# Patient Record
Sex: Female | Born: 1985 | Race: Black or African American | Hispanic: No | Marital: Single | Smoking: Former smoker
Health system: Southern US, Community
[De-identification: ages and names within clinical notes are randomized; demographics above are authoritative.]

## PROBLEM LIST (undated history)

## (undated) ENCOUNTER — Inpatient Hospital Stay (HOSPITAL_COMMUNITY): Payer: Medicaid Other

## (undated) DIAGNOSIS — R001 Bradycardia, unspecified: Secondary | ICD-10-CM

## (undated) DIAGNOSIS — E079 Disorder of thyroid, unspecified: Secondary | ICD-10-CM

## (undated) DIAGNOSIS — F410 Panic disorder [episodic paroxysmal anxiety] without agoraphobia: Secondary | ICD-10-CM

## (undated) DIAGNOSIS — R011 Cardiac murmur, unspecified: Secondary | ICD-10-CM

## (undated) DIAGNOSIS — R519 Headache, unspecified: Secondary | ICD-10-CM

## (undated) DIAGNOSIS — R0601 Orthopnea: Secondary | ICD-10-CM

## (undated) DIAGNOSIS — F419 Anxiety disorder, unspecified: Secondary | ICD-10-CM

## (undated) DIAGNOSIS — E039 Hypothyroidism, unspecified: Secondary | ICD-10-CM

## (undated) DIAGNOSIS — R51 Headache: Secondary | ICD-10-CM

## (undated) DIAGNOSIS — R55 Syncope and collapse: Secondary | ICD-10-CM

## (undated) DIAGNOSIS — Z8719 Personal history of other diseases of the digestive system: Secondary | ICD-10-CM

## (undated) DIAGNOSIS — Z87440 Personal history of urinary (tract) infections: Secondary | ICD-10-CM

## (undated) DIAGNOSIS — L709 Acne, unspecified: Secondary | ICD-10-CM

## (undated) DIAGNOSIS — Z950 Presence of cardiac pacemaker: Secondary | ICD-10-CM

## (undated) HISTORY — DX: Morbid (severe) obesity due to excess calories: E66.01

## (undated) HISTORY — DX: Syncope and collapse: R55

## (undated) HISTORY — DX: Disorder of thyroid, unspecified: E07.9

## (undated) HISTORY — DX: Panic disorder (episodic paroxysmal anxiety): F41.0

## (undated) HISTORY — DX: Bradycardia, unspecified: R00.1

---
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
## 2005-05-01 ENCOUNTER — Emergency Department (HOSPITAL_COMMUNITY): Admission: EM | Admit: 2005-05-01 | Discharge: 2005-05-01 | Payer: Self-pay | Admitting: Emergency Medicine

## 2006-01-12 ENCOUNTER — Inpatient Hospital Stay (HOSPITAL_COMMUNITY): Admission: AD | Admit: 2006-01-12 | Discharge: 2006-01-12 | Payer: Self-pay | Admitting: Family Medicine

## 2006-02-15 ENCOUNTER — Inpatient Hospital Stay (HOSPITAL_COMMUNITY): Admission: AD | Admit: 2006-02-15 | Discharge: 2006-02-16 | Payer: Self-pay | Admitting: Gynecology

## 2006-06-22 ENCOUNTER — Inpatient Hospital Stay (HOSPITAL_COMMUNITY): Admission: AD | Admit: 2006-06-22 | Discharge: 2006-06-22 | Payer: Self-pay | Admitting: Obstetrics and Gynecology

## 2006-07-14 ENCOUNTER — Inpatient Hospital Stay (HOSPITAL_COMMUNITY): Admission: AD | Admit: 2006-07-14 | Discharge: 2006-07-14 | Payer: Self-pay | Admitting: Obstetrics & Gynecology

## 2006-07-17 ENCOUNTER — Inpatient Hospital Stay (HOSPITAL_COMMUNITY): Admission: AD | Admit: 2006-07-17 | Discharge: 2006-07-18 | Payer: Self-pay | Admitting: Obstetrics and Gynecology

## 2006-08-25 ENCOUNTER — Inpatient Hospital Stay (HOSPITAL_COMMUNITY): Admission: AD | Admit: 2006-08-25 | Discharge: 2006-08-28 | Payer: Self-pay | Admitting: Obstetrics & Gynecology

## 2008-03-09 ENCOUNTER — Emergency Department (HOSPITAL_COMMUNITY): Admission: EM | Admit: 2008-03-09 | Discharge: 2008-03-10 | Payer: Self-pay | Admitting: Emergency Medicine

## 2008-09-02 ENCOUNTER — Inpatient Hospital Stay (HOSPITAL_COMMUNITY): Admission: AD | Admit: 2008-09-02 | Discharge: 2008-09-03 | Payer: Self-pay | Admitting: Family Medicine

## 2008-09-21 ENCOUNTER — Emergency Department (HOSPITAL_COMMUNITY): Admission: EM | Admit: 2008-09-21 | Discharge: 2008-09-22 | Payer: Self-pay | Admitting: Emergency Medicine

## 2008-10-29 ENCOUNTER — Inpatient Hospital Stay (HOSPITAL_COMMUNITY): Admission: AD | Admit: 2008-10-29 | Discharge: 2008-10-29 | Payer: Self-pay | Admitting: Obstetrics & Gynecology

## 2009-03-03 ENCOUNTER — Inpatient Hospital Stay (HOSPITAL_COMMUNITY): Admission: AD | Admit: 2009-03-03 | Discharge: 2009-03-04 | Payer: Self-pay | Admitting: *Deleted

## 2009-03-24 ENCOUNTER — Inpatient Hospital Stay (HOSPITAL_COMMUNITY): Admission: AD | Admit: 2009-03-24 | Discharge: 2009-03-24 | Payer: Self-pay | Admitting: Obstetrics and Gynecology

## 2009-04-05 ENCOUNTER — Inpatient Hospital Stay (HOSPITAL_COMMUNITY): Admission: AD | Admit: 2009-04-05 | Discharge: 2009-04-07 | Payer: Self-pay | Admitting: Obstetrics & Gynecology

## 2010-12-31 LAB — CBC
HCT: 32.8 % — ABNORMAL LOW (ref 36.0–46.0)
Hemoglobin: 11.8 g/dL — ABNORMAL LOW (ref 12.0–15.0)
MCHC: 34.2 g/dL (ref 30.0–36.0)
MCV: 90 fL (ref 78.0–100.0)
RBC: 3.65 MIL/uL — ABNORMAL LOW (ref 3.87–5.11)
RBC: 3.84 MIL/uL — ABNORMAL LOW (ref 3.87–5.11)
WBC: 15.6 10*3/uL — ABNORMAL HIGH (ref 4.0–10.5)

## 2011-01-01 LAB — URINE CULTURE

## 2011-01-01 LAB — URINALYSIS, ROUTINE W REFLEX MICROSCOPIC
Protein, ur: NEGATIVE mg/dL
Urobilinogen, UA: 0.2 mg/dL (ref 0.0–1.0)

## 2011-01-01 LAB — FETAL FIBRONECTIN: Fetal Fibronectin: NEGATIVE

## 2011-01-01 LAB — URINE MICROSCOPIC-ADD ON

## 2011-01-09 LAB — URINALYSIS, ROUTINE W REFLEX MICROSCOPIC
Hgb urine dipstick: NEGATIVE
Nitrite: NEGATIVE
Specific Gravity, Urine: >1.03 — ABNORMAL HIGH (ref 1.005–1.030)
Urobilinogen, UA: 0.2 mg/dL (ref 0.0–1.0)

## 2011-01-09 LAB — COMPREHENSIVE METABOLIC PANEL
Albumin: 3.4 g/dL — ABNORMAL LOW (ref 3.5–5.2)
BUN: 6 mg/dL (ref 6–23)
Chloride: 106 mEq/L (ref 96–112)
Creatinine, Ser: 0.56 mg/dL (ref 0.4–1.2)
Total Bilirubin: 0.5 mg/dL (ref 0.3–1.2)

## 2011-01-09 LAB — CBC
HCT: 41.2 % (ref 36.0–46.0)
MCV: 95.3 fL (ref 78.0–100.0)
Platelets: 276 10*3/uL (ref 150–400)
RDW: 12.1 % (ref 11.5–15.5)
WBC: 16.3 10*3/uL — ABNORMAL HIGH (ref 4.0–10.5)

## 2011-01-09 LAB — URINE MICROSCOPIC-ADD ON

## 2011-02-09 NOTE — Cardiovascular Report (Signed)
NAME:  Jean Rowe, Jean Rowe NO.:  [id number]   MEDICAL RECORD NO.:  [id number]          PATIENT TYPE:  OIB   LOCATION:  2899                         FACILITY:  [facility]   PHYSICIAN:  Janeece Riggers. Severiano Gilbert, M.D.    DATE OF BIRTH:  07/03/1986   DATE OF PROCEDURE:  10/12/2004  DATE OF DISCHARGE:                              CARDIAC CATHETERIZATION   PROCEDURES PERFORMED:  1.  Dual chamber pacemaker implantation.  2.  Fluoroscopy for implantation.  3.  Left axillary venogram.   SURGEON:  Mark E. Severiano Gilbert, M.D.   COMPLICATIONS:  None.   ESTIMATED BLOOD LOSS:  Less than 30 mL.   PREPROCEDURE DIAGNOSIS:  Syncope secondary to severe cardioinhibitory reflex  with bradycardia.   POSTPROCEDURE DIAGNOSIS:  Syncope secondary to severe cardioinhibitory  reflex with bradycardia.   DESCRIPTION OF PROCEDURE:  The patient was brought to the EP laboratory in a  fasted state.  The patient was prepped and draped in the usual manner over  the left pectoral area.  Vancomycin 1 g IV immediately prior to skin  incision for antimicrobial prophylaxis.  Lidocaine 1% subcutaneously for  local anesthesia.  Conscious sedation obtained with intermittent injection  of midazolam, Fentanyl and Phenergan.  A left axillary venogram was  performed.  A 3.5 cm incision 2 cm inferior to the left clavicle was made to  the level of the prepectoral fascia using a combination of sharp and blunt  dissection.  Inferior to the incision line along the prepectoral fascia an  appropriately sized pacemaker pocket was fashioned.  Hemostasis was insured  by visual inspection, electrocautery.  The pocket was copiously irrigated  with antibiotic-containing solution.  Using a thin wall needle technique,  the left axillary vein was entered on the first rib in an extrathoracic  technique twice.  Two 7 French sheaths were placed.  Via the 7 French  sheaths, a Guidant model number 4469 active fixation atrial lead was  advanced under  fluoroscopic guidance initially into the IVC and then  positioned  in the right atrial appendage.  After determining adequate  electronic characteristics, the safe sheath was removed and the lead was  sutured to the prepectoral fascia.  Next, a Guidant model number 4456  passive fixation ventricular lead was advanced fluoroscopically first into  the IVC, then using a curved stylet into the pulmonary artery and then again  with a straight stylet positioned in the right ventricular apex.  After  determining adequate electronic characteristics, the lead was sutured in  place with 0 silk ligatures after removal of the safe sheath system.  The  atrial and ventricular leads were then connected to a Guidant model number  1291, serial number [id number] dual chamber pacemaker which was then implanted  in the preformed pacemaker pocket.  The pocket was then closed in layers of  2-0 and 4-0 Vicryl.  The skin incision was closely approximated using Steri-  Strips and a large bulky pressure dressing applied.  The patient tolerated  the procedure well and was returned to the holding area in stable  hemodynamic condition.   The electronic characteristics of the  atrial lead demonstrated through the  analyzer P-waves of approximately 3 mV, a threshold of 0.2 volts at 0.5  msec, an impedance of 585 ohms.  The ventricular lead demonstrated an R-wave  of 20 to 23 mV, capture threshold of 0.3 volts at 0.5 msec, an impedance of  575 ohms.  There was absence of diaphragmatic pacing with both the atrial  and ventricular leads.  The device was programmed into a dual chamber pacing  mode, lower rate 60, upper rate 150 with a prolonged AV delay of 240 msec.  Both the atrial and ventricular outputs were 0.5 msec, 5 volts.  Given the  patient's cardioinhibitor resyncope, sudden brady response was programmed on  for this device.  The settings were five minute average detect time, five  beat evaluation at lower rate, 40  beat offset, with a five minute therapy  duration.      Mark   MEP/MEDQ  D:  10/12/2004  T:  10/12/2004  Job:  [id number]

## 2011-02-09 NOTE — Cardiovascular Report (Signed)
NAME:  Jean Rowe, Jean Rowe NO.:  [id number]   MEDICAL RECORD NO.:  [id number]          PATIENT TYPE:  AMB   LOCATION:  ENDO                         FACILITY:  [facility]   PHYSICIAN:  Darlin Priestly, M.D.DATE OF BIRTH:  1986/05/15   DATE OF PROCEDURE:  06/20/2004  DATE OF DISCHARGE:                              CARDIAC CATHETERIZATION   TILT TABLE TEST:  [name] is an 25 year old female patient of Dr. Daphene Jaeger and Dr. Jason Fila  with a history of recurrent presyncope and palpitations.  She has had a  negative 2D echocardiogram revealing normal LV size and function with normal  labs.  She is now referred for a tilt table test to rule out  neurocardiogenic syncope.   DESCRIPTION OF PROCEDURE:  After getting informed consent, the patient was  brought to the cardiac catheterization lab in a fasting state.  The patient  was then placed in a supine position and hemodynamic measures were obtained.  Resting blood pressure 112/69, resting heart rate 57.  The patient was then  monitored for approximately 5 minutes with no change in her blood pressure  our heart rate.  The patient was then tilted to a 70-degree head's up  position which she maintained for approximately 24 minutes at which point  she became somewhat agitated, restless, and then had a rather abrupt drop of  her blood pressure to 50/27 and marked bradycardia with several 8-10 second  pauses.  She was immediately returned to a supine position with prompt  return of her blood pressure and heart rate.  The patient was monitored for  approximately 10-15 minutes following this with return of her blood pressure  and heart rate to baseline.  She was transferred to recovery room in stable  condition.   CONCLUSION:  Positive head's up tilt table testing with marked hypotension,  bradycardia and several significant pauses of anywhere from 5-10 seconds.       RHM/MEDQ  D:  06/20/2004  T:  06/20/2004  Job:  [id number]

## 2011-02-09 NOTE — Discharge Summary (Signed)
NAME:  Jean Rowe, Jean Rowe NO.:  [id number]   MEDICAL RECORD NO.:  [id number]          PATIENT TYPE:  INP   LOCATION:  9131                          FACILITY:  [facility]   PHYSICIAN:  Malva Limes, M.D.    DATE OF BIRTH:  [birth year]   DATE OF ADMISSION:  08/25/2006  DATE OF DISCHARGE:  08/28/2006                               DISCHARGE SUMMARY   FINAL DIAGNOSIS:  1. Intrauterine pregnancy at 38+ weeks' gestation.  2. Oligohydramnios.  3. Positive group B strep.  4. Induction of labor.  5. Severe variables in the second stage of labor.  6. Moderate shoulder dystocia.   PROCEDURE:  Vacuum-assisted vaginal delivery of a female infant with  Apgars of 3, 7, and 8.  Main delivery performed by Dr. Ilda Mori.   COMPLICATIONS:  None.   This 25 year old G3, P0,0-2-0 presents at 38+ weeks' gestation for an  induction secondary to oligohydramnios.  The patient also has a known  positive group B strep culture that was obtained in the office.  Otherwise, her antepartum course has been uncomplicated.  She does have  a pacemaker in place secondary to syncope.  Otherwise, the patient was  admitted at this time.  AROM was performed and IV Pitocin was begun.  Ancef was also started for her group B strep positive culture.  No  sensitivities had been done. The patient dilated to complete.  She  developed some severe variable decelerations with pushing. At this  point, a Kiwi vacuum extractor was applied.  There was moderate shoulder  dystocia.  McRobert suprapubic pressure and Woods-screw maneuver were  used to deliver the posterior arm and shoulder.  Delivery went without  complications.  There was a nuchal cord times one.  She went on to  deliver a 6 pound 15 ounce female infant with Apgars of 3, 7, and 8.  There was a first-degree perineal laceration that was repaired.  Pediatrics was called to help with delivery.  The patient's postpartum  course was benign without any significant  fevers.  She was felt ready  for discharge on postpartum day #2.  She was sent home on a regular  diet, told to decrease activities, was told to use Percocet 1 to 2 every  4 hours as needed for pain.  She could use over-the-counter ibuprofen up  to 600 mg every 6 hours as needed for pain.  She did receive Depo-  Provera for birth control prior to discharge.  She is to follow up at  our office in 4 weeks.   LABS:  On discharge, the patient had a hemoglobin of 11.9, white blood  cell count of 18.5, and platelets of 160,000.      Leilani Able, P.A.-C.    ______________________________  Malva Limes, M.D.   MB/MEDQ  D:  09/25/2006  T:  09/25/2006  Job:  [id number]

## 2011-06-21 LAB — CBC
MCHC: 34.6
MCV: 92.6
Platelets: 234
RDW: 12.7
WBC: 8.8

## 2011-06-21 LAB — POCT CARDIAC MARKERS
CKMB, poc: <1 — ABNORMAL LOW
CKMB, poc: <1 — ABNORMAL LOW
Myoglobin, poc: 26.5
Myoglobin, poc: 27.3
Troponin i, poc: <0.05

## 2011-06-21 LAB — COMPREHENSIVE METABOLIC PANEL
AST: 19
Albumin: 3.5
BUN: 11
Calcium: 8.3 — ABNORMAL LOW
Creatinine, Ser: 0.66
GFR calc Af Amer: >60
Total Protein: 5.9 — ABNORMAL LOW

## 2011-06-21 LAB — DIFFERENTIAL
Eosinophils Relative: 2
Lymphocytes Relative: 37
Lymphs Abs: 3.2
Monocytes Absolute: 0.4
Neutro Abs: 5

## 2011-06-21 LAB — D-DIMER, QUANTITATIVE: D-Dimer, Quant: <0.22

## 2011-06-29 LAB — URINALYSIS, ROUTINE W REFLEX MICROSCOPIC
Bilirubin Urine: NEGATIVE
Nitrite: NEGATIVE
Specific Gravity, Urine: >1.03 — ABNORMAL HIGH (ref 1.005–1.030)
Urobilinogen, UA: 0.2 mg/dL (ref 0.0–1.0)

## 2011-06-29 LAB — CBC
MCHC: 34.7 g/dL (ref 30.0–36.0)
RBC: 4.04 MIL/uL (ref 3.87–5.11)

## 2011-06-29 LAB — POCT PREGNANCY, URINE: Preg Test, Ur: POSITIVE

## 2011-06-29 LAB — POCT I-STAT, CHEM 8
Creatinine, Ser: 0.6 mg/dL (ref 0.4–1.2)
HCT: 39 % (ref 36.0–46.0)
Hemoglobin: 13.3 g/dL (ref 12.0–15.0)
Potassium: 4 mEq/L (ref 3.5–5.1)
Sodium: 138 mEq/L (ref 135–145)

## 2011-06-29 LAB — DIFFERENTIAL
Basophils Absolute: 0 10*3/uL (ref 0.0–0.1)
Basophils Relative: 0 % (ref 0–1)
Monocytes Relative: 5 % (ref 3–12)
Neutro Abs: 10.2 10*3/uL — ABNORMAL HIGH (ref 1.7–7.7)
Neutrophils Relative %: 78 % — ABNORMAL HIGH (ref 43–77)

## 2011-06-29 LAB — D-DIMER, QUANTITATIVE: D-Dimer, Quant: 0.28 ug/mL-FEU (ref 0.00–0.48)

## 2011-09-25 HISTORY — PX: DILATION AND CURETTAGE OF UTERUS: SHX78

## 2012-03-06 ENCOUNTER — Encounter: Payer: Self-pay | Admitting: Internal Medicine

## 2013-06-23 ENCOUNTER — Ambulatory Visit (INDEPENDENT_AMBULATORY_CARE_PROVIDER_SITE_OTHER): Payer: Medicaid Other | Admitting: Cardiovascular Disease

## 2013-06-23 ENCOUNTER — Other Ambulatory Visit: Payer: Self-pay | Admitting: Cardiovascular Disease

## 2013-06-23 ENCOUNTER — Encounter: Payer: Self-pay | Admitting: Cardiovascular Disease

## 2013-06-23 VITALS — BP 112/60 | HR 89 | Resp 16 | Ht 61.0 in | Wt 150.6 lb

## 2013-06-23 DIAGNOSIS — R55 Syncope and collapse: Secondary | ICD-10-CM | POA: Insufficient documentation

## 2013-06-23 DIAGNOSIS — R079 Chest pain, unspecified: Secondary | ICD-10-CM

## 2013-06-23 DIAGNOSIS — R5381 Other malaise: Secondary | ICD-10-CM

## 2013-06-23 DIAGNOSIS — E049 Nontoxic goiter, unspecified: Secondary | ICD-10-CM | POA: Insufficient documentation

## 2013-06-23 DIAGNOSIS — Z950 Presence of cardiac pacemaker: Secondary | ICD-10-CM | POA: Insufficient documentation

## 2013-06-23 LAB — T4, FREE: Free T4: 1.19 ng/dL (ref 0.80–1.80)

## 2013-06-23 LAB — TSH: TSH: 0.184 u[IU]/mL — ABNORMAL LOW (ref 0.350–4.500)

## 2013-06-23 NOTE — Assessment & Plan Note (Signed)
No events since pacemaker implantation, after she had failed medical therapy with numerous meds (fludrocortisone, serotonin inhibitors, etc.). Sudden bradycardia response occurs frequently on her pacemaker.

## 2013-06-23 NOTE — Progress Notes (Signed)
Patient ID: Jean Rowe, female   DOB: March 13, 1986, 27 y.o.   MRN: [id number]     Reason for office visit Pacemaker followup, neurocardiogenic syncope  This is [name] first office visit and first pacemaker check since July of 2011. She was previously followed by Dr. Lynnea Rowe who is no longer part of our practice. She has a long-standing history of neurocardiogenic syncope and underwent implantation of a dual-chamber permanent pacemaker which was only 27 years old. Prior to that multiple attempts at treatment of her syncope with a variety of pharmacological agents were unsuccessful. Now she is no longer taking any medications for syncope and has had excellent response to pacemaker therapy. She has not lost consciousness once since pacemaker implantation.  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 Multiple episodes ofSBR intervention have occurred but since her device has not been interrogated in a long time the exact origin is difficult to assess. She has roughly 45 interventions a week in the last couple of months. Older episodes have been deleted.  Previous workup has shown no evidence of structural heart disease with a normal echocardiogram in September of 2009. She had a positive tilt table test in September of 2005 with 8 combination of vasopressor and cardioinhibitory components. Episodes of sinus pause of 4-5 seconds were noted during that test. In 2009 she also underwent a graded exercise treadmill stress test that was normal with good workload.  She generally feels well and has no complaints. She states that she did not comply with medical followup because of financial/medical insurance issues. She now has Medicaid    Allergies  Allergen Reactions  . Banana Anaphylaxis    Swelling of throat  . Penicillins   . Latex Rash    Current Outpatient Prescriptions  Medication Sig Dispense Refill  . ALPRAZolam (XANAX) 0.25 MG  tablet Take 0.25 mg by mouth at bedtime as needed for sleep.      . benzoyl peroxide (BREVOXYL) 4 % lotion Apply topically 2 (two) times daily.      . cyclobenzaprine (FLEXERIL) 10 MG tablet Take 10 mg by mouth 2 (two) times daily as needed for muscle spasms.      Marland Kitchen HYDROcodone-acetaminophen (NORCO/VICODIN) 5-325 MG per tablet Take 1 tablet by mouth every 8 (eight) hours as needed for pain.      . traMADol (ULTRAM) 50 MG tablet Take 50 mg by mouth daily.      . traZODone (DESYREL) 50 MG tablet Take 50 mg by mouth at bedtime.       No current facility-administered medications for this visit.    No past medical history on file.  No past surgical history on file.  No family history on file.  History   Social History  . Marital Status: Single    Spouse Name: N/A    Number of Children: N/A  . Years of Education: N/A   Occupational History  . Not on file.   Social History Main Topics  . Smoking status: Former Games developer  . Smokeless tobacco: Not on file  . Alcohol Use: Yes     Comment: occas.  . Drug Use: No  . Sexual Activity: Not on file   Other Topics Concern  . Not on file   Social History Narrative  . No narrative on file    Review of systems: The patient specifically denies any chest pain at rest or with exertion, dyspnea at rest or with exertion, orthopnea, paroxysmal nocturnal  dyspnea, syncope, palpitations, focal neurological deficits, intermittent claudication, lower extremity edema, unexplained weight gain, cough, hemoptysis or wheezing.  The patient also denies abdominal pain, nausea, vomiting, dysphagia, diarrhea, constipation, polyuria, polydipsia, dysuria, hematuria, frequency, urgency, abnormal bleeding or bruising, fever, chills, unexpected weight changes, mood swings, change in skin or hair texture, change in voice quality, auditory or visual problems, allergic reactions or rashes, new musculoskeletal complaints other than usual "aches and pains".   PHYSICAL  EXAM BP 112/60  Pulse 89  Resp 16  Ht 5\' 1"  (1.549 m)  Wt 150 lb 9.6 oz (68.312 kg)  BMI 28.47 kg/m2  General: Alert, oriented x3, no distress Head: no evidence of trauma, PERRL, EOMI, no exophtalmos or lid lag, no myxedema, no xanthelasma; normal ears, nose and oropharynx Neck: normal jugular venous pulsations and no hepatojugular reflux; brisk carotid pulses without delay and no carotid bruits Chest: clear to auscultation, no signs of consolidation by percussion or palpation, normal fremitus, symmetrical and full respiratory excursions; the left subclavian pacemaker site appears healthy Cardiovascular: normal position and quality of the apical impulse, regular rhythm, normal first and second heart sounds, no murmurs, rubs or gallops Abdomen: no tenderness or distention, no masses by palpation, no abnormal pulsatility or arterial bruits, normal bowel sounds, no hepatosplenomegaly Extremities: no clubbing, cyanosis or edema; 2+ radial, ulnar and brachial pulses bilaterally; 2+ right femoral, posterior tibial and dorsalis pedis pulses; 2+ left femoral, posterior tibial and dorsalis pedis pulses; no subclavian or femoral bruits Neurological: grossly nonfocal   EKG: Normal sinus rhythm, early repolarization changes  Lipid Panel  No results found for this basename: chol, trig, hdl, cholhdl, vldl, ldlcalc    BMET    Component Value Date/Time   NA 134* 10/29/2008 1910   K 3.5 10/29/2008 1910   CL 106 10/29/2008 1910   CO2 21 10/29/2008 1910   GLUCOSE 84 10/29/2008 1910   BUN 6 10/29/2008 1910   CREATININE 0.56 10/29/2008 1910   CALCIUM 8.8 10/29/2008 1910   GFRNONAA >60 10/29/2008 1910   GFRAA  Value: >60        The eGFR has been calculated using the MDRD equation. This calculation has not been validated in all clinical situations. eGFR's persistently <60 mL/min signify possible Chronic Kidney Disease. 10/29/2008 1910     ASSESSMENT AND PLAN Neurocardiogenic syncope No events since pacemaker  implantation, after she had failed medical therapy with numerous meds (fludrocortisone, serotonin inhibitors, etc.). Sudden bradycardia response occurs frequently on her pacemaker.   Pacemaker - [employer] 2006 Normal device function. Reinforced need for routine device checks - at least Q6 months. She still has ">5 years battery life".  Goiter Recommend TFTs.  please refer to the separate device note Orders Placed This Encounter  Procedures  . T4, free  . TSH  . EKG 12-Lead   Meds ordered this encounter  Medications  . HYDROcodone-acetaminophen (NORCO/VICODIN) 5-325 MG per tablet    Sig: Take 1 tablet by mouth every 8 (eight) hours as needed for pain.  . cyclobenzaprine (FLEXERIL) 10 MG tablet    Sig: Take 10 mg by mouth 2 (two) times daily as needed for muscle spasms.  . ALPRAZolam (XANAX) 0.25 MG tablet    Sig: Take 0.25 mg by mouth at bedtime as needed for sleep.  . traMADol (ULTRAM) 50 MG tablet    Sig: Take 50 mg by mouth daily.  . traZODone (DESYREL) 50 MG tablet    Sig: Take 50 mg by mouth at bedtime.  Marland Kitchen  benzoyl peroxide (BREVOXYL) 4 % lotion    Sig: Apply topically 2 (two) times daily.    Junious Silk, MD, [facility] [phone] office [phone] pager

## 2013-06-23 NOTE — Progress Notes (Signed)
In office pacemaker interrogation. Normal device function. No changes made this session. 

## 2013-06-23 NOTE — Assessment & Plan Note (Signed)
Recommend TFTs.

## 2013-06-23 NOTE — Assessment & Plan Note (Signed)
Normal device function. Reinforced need for routine device checks - at least Q6 months. She still has ">5 years battery life".

## 2013-06-23 NOTE — Patient Instructions (Addendum)
Your physician recommends that you return for lab work as soon as possible.  Your physician recommends that you schedule a follow-up appointment in: 6 months for a pacemaker interrogation, and in 12 months with MD

## 2013-06-24 ENCOUNTER — Telehealth: Payer: Self-pay | Admitting: *Deleted

## 2013-06-24 DIAGNOSIS — E0590 Thyrotoxicosis, unspecified without thyrotoxic crisis or storm: Secondary | ICD-10-CM

## 2013-06-24 LAB — PACEMAKER DEVICE OBSERVATION
AL AMPLITUDE: 3.6 mv
AL THRESHOLD: 0.4 V
RV LEAD AMPLITUDE: 11.9 mv
RV LEAD IMPEDENCE PM: 410 Ohm
RV LEAD THRESHOLD: 0.4 V

## 2013-06-24 NOTE — Telephone Encounter (Signed)
Message copied by Vita Barley on Wed Jun 24, 2013  1:09 PM ------      Message from: Thurmon Fair      Created: Tue Jun 23, 2013 11:19 PM       Labs do suggest her thyroid is hyperactive and she should see an endocrinologist ------

## 2013-06-24 NOTE — Telephone Encounter (Signed)
Patient notified of abnormal lab results - hyperthyroid. Told patient she will be referred to an endocrinologist.  Patient voiced understanding.

## 2013-06-25 ENCOUNTER — Encounter: Payer: Self-pay | Admitting: Cardiovascular Disease

## 2013-07-01 ENCOUNTER — Telehealth: Payer: Self-pay | Admitting: Cardiovascular Disease

## 2013-07-01 NOTE — Telephone Encounter (Signed)
Please call-seems to be getting worse-Said Dr C was suppose to refer her to a specialist for her thyroid.

## 2013-07-01 NOTE — Telephone Encounter (Signed)
Called [facility] to schedule pt's appt.  They work referrals from the [facility] and will contact the patient to schedule.

## 2013-07-02 ENCOUNTER — Other Ambulatory Visit: Payer: Self-pay | Admitting: *Deleted

## 2013-07-02 DIAGNOSIS — E0590 Thyrotoxicosis, unspecified without thyrotoxic crisis or storm: Secondary | ICD-10-CM

## 2013-07-02 NOTE — Telephone Encounter (Signed)
Per [facility] they were unable to see the order, so it was redone under [facility] which shows under IM.  They will contact the patient and make an appt.  Patient notified and voiced understanding.

## 2013-07-10 ENCOUNTER — Ambulatory Visit: Payer: Medicaid Other | Admitting: Endocrinology

## 2013-07-24 ENCOUNTER — Ambulatory Visit: Payer: Medicaid Other | Admitting: Endocrinology

## 2013-07-28 ENCOUNTER — Ambulatory Visit: Payer: Medicaid Other | Admitting: Endocrinology

## 2013-07-31 ENCOUNTER — Ambulatory Visit: Payer: Medicaid Other | Admitting: Endocrinology

## 2013-09-01 ENCOUNTER — Ambulatory Visit (INDEPENDENT_AMBULATORY_CARE_PROVIDER_SITE_OTHER): Payer: Medicaid Other | Admitting: Endocrinology

## 2013-09-01 ENCOUNTER — Encounter: Payer: Self-pay | Admitting: Endocrinology

## 2013-09-01 VITALS — BP 110/78 | HR 91 | Temp 98.2°F | Resp 12 | Ht 61.0 in | Wt 152.1 lb

## 2013-09-01 DIAGNOSIS — R1310 Dysphagia, unspecified: Secondary | ICD-10-CM

## 2013-09-01 DIAGNOSIS — E042 Nontoxic multinodular goiter: Secondary | ICD-10-CM

## 2013-09-01 LAB — T4, FREE: Free T4: 0.82 ng/dL (ref 0.60–1.60)

## 2013-09-01 LAB — T3, FREE: T3, Free: 3.2 pg/mL (ref 2.3–4.2)

## 2013-09-01 NOTE — Progress Notes (Signed)
Jean Rowe   Reason for Appointment: Goiter, new consultation   History of Present Illness:   The patient's thyroid enlargement was first discovered probably in high school but no evaluation was done at that time  For the last 6 months or so she has had difficulty with swallowing food and also even her own saliva. She says that she sometimes has to force her food to go down as it is difficult to swallow. She was also choke on liquids at times. She has a pressure sensation in her neck at times. She cannot lie back to sleep and usually has to prop herself on pillows and has to lie on her left side to sleep.  When she was having her followup with her cardiologist for her pacemaker it was noticed that she had a thyroid enlargement Also she had an abnormal TSH level. She was referred for evaluation over 2 months ago and is now coming here for consultation  On direct questioning the patient says that she may occasionally have transient palpitations. She has felt hot and sweaty for the last 6 months. Also has feeling of fatigue but also has difficulty sleeping at night. No recent weight loss or shakiness   Lab Results  Component Value Date   FREET4 1.19 06/23/2013   TSH 0.184* 06/23/2013       Medication List       This list is accurate as of: 09/01/13  9:36 AM.  Always use your most recent med list.               ALPRAZolam 0.25 MG tablet  Commonly known as:  XANAX  Take 0.25 mg by mouth at bedtime as needed for sleep.     benzoyl peroxide 4 % lotion  Commonly known as:  BREVOXYL  Apply topically 2 (two) times daily.     cyclobenzaprine 10 MG tablet  Commonly known as:  FLEXERIL  Take 10 mg by mouth 2 (two) times daily as needed for muscle spasms.     HYDROcodone-acetaminophen 5-325 MG per tablet  Commonly known as:  NORCO/VICODIN  Take 1 tablet by mouth every 8 (eight) hours as needed for pain.     traMADol 50 MG tablet  Commonly known as:  ULTRAM  Take 50 mg  by mouth daily.     traZODone 50 MG tablet  Commonly known as:  DESYREL  Take 50 mg by mouth at bedtime.        Allergies:  Allergies  Allergen Reactions  . Banana Anaphylaxis    Swelling of throat  . Penicillins   . Latex Rash    Past Medical History  Diagnosis Date  . Thyroid disease     Past Surgical History  Procedure Laterality Date  . Pacemaker insertion  09/2004    Family History  Problem Relation Age of Onset  . Thyroid disease Mother   . Heart disease Mother   . Thyroid disease Maternal Uncle   . Thyroid disease Maternal Grandmother    Most of her family members have had surgery for thyroid enlargement  Social History:  reports that she has quit smoking. She does not have any smokeless tobacco history on file. She reports that she drinks alcohol. She reports that she does not use illicit drugs.   Review of Systems:  No recent weight change CARDIOLOGY: no history of high blood pressure.  She had a pacemaker inserted in 2006 because of syncope secondary to sinus pauses  GASTROENTEROLOGY:  no Change in bowel habits.      ENDOCRINOLOGY:  no history of Diabetes.     No skin rash No swelling of her feet       Examination:   BP 110/78  Pulse 91  Temp(Src) 98.2 F (36.8 C)  Resp 12  Ht 5\' 1"  (1.549 m)  Wt 152 lb 1.6 oz (68.992 kg)  BMI 28.75 kg/m2  SpO2 97%   General Appearance:  mild generalized obesity present. Alert and cooperative      Eyes: No abnormal prominence or eyelid swelling.          Neck: The thyroid is enlarged about 3 times normal on the right and less so on the left. Thyroid is somewhat nodular and firm, smooth.  Neck circumference is 38 cm There is no stridor. Pemberton sign is negative There is no lymphadenopathy in the neck.    Cardiovascular: Normal  heart sounds, no murmur Respiratory:  Lungs clear Neurological: REFLEXES: at biceps are slightly brisk, no tremor present Skin: no rash, hands are not unusually warm or  moist        Assessment/Plan:  Multinodular goiter, appears to be having local pressure symptoms. She also has had a low TSH in the past and will need to rule out subclinical hyperthyroidism with thyroid levels today Will consider CT scan of her neck to identify the anatomy better if she is not a candidate for radioactive iodine treatment Patient is receptive to the idea of surgery if needed to relieve her local pressure symptoms Discussed patient's history and recommendations with her mother also on the phone  [facility] 06/24/2013, 11:25 AM   Addendum: Thyroid levels are normal, will proceed with CT scan of her neck  Office Visit on 09/01/2013  Component Date Value Range Status  . TSH 09/01/2013 0.55  0.35 - 5.50 uIU/mL Final  . Free T4 09/01/2013 0.82  0.60 - 1.60 ng/dL Final  . T3, Free 16/06/9603 3.2  2.3 - 4.2 pg/mL Final

## 2013-09-01 NOTE — Progress Notes (Signed)
Quick Note:  Please let patient know that the thyroid result is normal and will schedule CAT scan of her neck to evaluate the thyroid and probably refer to surgeon subsequently ______

## 2013-09-07 ENCOUNTER — Ambulatory Visit (INDEPENDENT_AMBULATORY_CARE_PROVIDER_SITE_OTHER)
Admission: RE | Admit: 2013-09-07 | Discharge: 2013-09-07 | Disposition: A | Discharge status: Home / Self Care | Payer: Medicaid Other | Source: Ambulatory Visit | Attending: Endocrinology | Admitting: Endocrinology

## 2013-09-07 ENCOUNTER — Other Ambulatory Visit: Payer: Medicaid Other

## 2013-09-07 DIAGNOSIS — E042 Nontoxic multinodular goiter: Secondary | ICD-10-CM

## 2013-09-07 MED ORDER — IOHEXOL 300 MG/ML  SOLN
75.0000 mL | Freq: Once | INTRAMUSCULAR | Status: AC | PRN
  Administered 2013-09-07: 75 mL via INTRAVENOUS

## 2013-09-08 ENCOUNTER — Other Ambulatory Visit: Payer: Self-pay | Admitting: Endocrinology

## 2013-09-08 ENCOUNTER — Other Ambulatory Visit: Payer: Medicaid Other

## 2013-09-08 DIAGNOSIS — E049 Nontoxic goiter, unspecified: Secondary | ICD-10-CM

## 2013-09-10 ENCOUNTER — Telehealth: Payer: Self-pay | Admitting: *Deleted

## 2013-09-10 NOTE — Telephone Encounter (Signed)
Pt would like to know what the size of the goiter is that was found on her CT scan. CB# [phone]

## 2013-09-11 NOTE — Telephone Encounter (Signed)
3.7 cm Rt and 2.6 left

## 2013-09-11 NOTE — Telephone Encounter (Signed)
Noted, patient is aware. 

## 2013-10-01 ENCOUNTER — Encounter (INDEPENDENT_AMBULATORY_CARE_PROVIDER_SITE_OTHER): Payer: Self-pay

## 2013-10-01 ENCOUNTER — Encounter (INDEPENDENT_AMBULATORY_CARE_PROVIDER_SITE_OTHER): Payer: Self-pay | Admitting: General Surgery

## 2013-10-01 ENCOUNTER — Ambulatory Visit (INDEPENDENT_AMBULATORY_CARE_PROVIDER_SITE_OTHER): Payer: Medicaid Other | Admitting: General Surgery

## 2013-10-01 VITALS — BP 120/80 | HR 78 | Temp 98.3°F | Resp 24 | Ht 61.0 in | Wt 139.0 lb

## 2013-10-01 DIAGNOSIS — E049 Nontoxic goiter, unspecified: Secondary | ICD-10-CM

## 2013-10-01 NOTE — Patient Instructions (Signed)
You have a goiter which is an abnormal enlargement of her thyroid gland. This is most likely a benign, nontender as multinodular goiter.  He had developed pressure symptoms on your esophagus causing problems when he swallowed, and you have developed pressure symptoms on your trachea which causes trouble breathing when you are sleeping on her back.  Dr. Lucianne MussKumar has completed your medical evaluation, and the only good option we have at this point in time is a total thyroidectomy operation. You have stated that yoy would like to go ahead and plan that.  You'll be scheduled for a total thyroidectomy at [facility] in the near future.        Thyroidectomy Care After Refer to this sheet in the next few weeks. These instructions provide you with general information on caring for yourself after you leave the hospital. Your caregiver also may give you specific instructions. Your treatment has been planned according to the most current medical practices available, but problems sometimes occur. Call your caregiver if you have any problems or questions after your procedure. HOME CARE INSTRUCTIONS   It is normal to be sore for a few weeks following surgery. See your caregiver if your pain seems to be getting worse rather than better.  Only take over-the-counter or prescription medicines for pain, discomfort, or fever as directed by your caregiver. Avoid taking medicines that contain aspirin and ibuprofen because they increase the risk of bleeding.  Shower rather than bathe until instructed otherwise by your caregiver.  Change your bandages (dressings) as directed by your caregiver.  You may resume a normal diet and activities as directed by your caregiver.  Avoid lifting weight greater than 20 lb (9 kg) or participating in heavy exercise or contact sports for 10 days or as instructed by your caregiver.  Make an appointment to see your caregiver for stitch (suture) or staple removal. SEEK MEDICAL  CARE IF:   You have increased bleeding from your wound.  You have redness, swelling, or increasing pain from your wound or in your neck.  There is pus coming from your wound.  You have an oral temperature above 102 F (38.9 C).  There is a bad smell coming from the wound or dressing.  You develop lightheadedness or feel faint.  You develop numbness, tingling, or muscle spasms in your arms, hands, feet, or face.  You have difficulty swallowing. SEEK IMMEDIATE MEDICAL CARE IF:   You develop a rash.  You have difficulty breathing.  You hear whistling noises that come from your chest.  You develop a cough that becomes increasingly worse.  You develop any reaction or side effects to medicines given.  There is swelling in your neck.  You develop changes in speech or hoarseness, which is getting worse. MAKE SURE YOU:   Understand these instructions.  Will watch your condition.  Will get help right away if you are not doing well or get worse. Document Released: 03/30/2005 Document Revised: 12/03/2011 Document Reviewed: 11/17/2010 [facility] Patient Information 2014 [place].       Thyroidectomy Thyroidectomy is the removal of part or all of your thyroid gland. Your thyroid gland is a butterfly-shaped gland at the base of your neck. It produces a substance called thyroid hormone, which regulates the physical and chemical processes that keep your body functioning and make energy available to your body (metabolism). The amount of thyroid gland tissue that is removed during a thyroidectomy depends on the reason for the procedure. Typically, if only a part  of your gland is removed, enough thyroid gland tissue remains to maintain normal function. If your entire thyroid gland is removed or if the amount of thyroid gland tissue remaining is inadequate to maintain normal function, you will need life-long treatment with thyroid hormone on a daily basis. Thyroidectomy maybe  performed when you have the following conditions:  Thyroid nodules. These are small, abnormal collections of tissue that form inside the thyroid gland. If these nodules begin to enlarge at a rapid rate, a sample of tissue from the nodule is taken through a needle and examined (needle biopsy). This is done to determine if the nodules are cancerous. Depending on the outcome of this exam, thyroidectomy may be necessary.  Thyroid cancer.  Goiter, which is an enlarged thyroid gland. All or part of the thyroid gland may be removed if the gland has become so large that it causes difficulty breathing or swallowing.  Hyperthyroidism. This is when the thyroid gland produces too much thyroid hormone. Hypothyroidism can cause symptoms of fluctuating weight, intolerance to heat, irritability, shortness of breath, and chest pain. LET YOUR CAREGIVER KNOW ABOUT:   Allergies to food or medicine.  Medicines that you are taking, including vitamins, herbs, eyedrops, over-the-counter medicines, and creams.  Previous problems you have had with anesthetics or numbing medicines.  History of bleeding problems or blood clots.  Previous surgeries you have had.  Other health problems, including diabetes and kidney problems, you have had.  Possibility of pregnancy, if this applies. BEFORE THE PROCEDURE   Do not eat or drink anything, including water, for at least 6 hours before the procedure.  Ask your caregiver whether you should stop taking certain medicines before the day of the procedure. PROCEDURE  There are different ways that thyroidectomy is performed. For each type, you will be given a medicine to make you sleep (general anesthetic). The three main types of thyroidectomy are listed as follows:  Conventional thyroidectomy A cut (incision) in the center portion of your lower neck is made with a scalpel. Muscles below your skin are separated to gain access to your thyroid gland. Your thyroid gland is  dissected from your windpipe (trachea). Often a drain is placed at the incision site to drain any blood that accumulates under the skin after the procedure. This drain will be removed before you go home. The wound from the incision should heal within 2 weeks.  Endoscopic thyroidectomy Small incisions are made in your lower neck. A small instrument (endoscope) is inserted under your skin at the incision sites. The endoscope used for thyroidectomy consists of 2 flexible tubes. Inside one of the tubes is a video camera that is used to guide the Careers adviser. Tools to remove the thyroid gland, including a tool to cut the gland (dissectors) and a suction device, are inserted through the other tube. The surgeon uses the dissectors to dissect the thyroid gland from the trachea and remove it.  Robotic thyroidectomy This procedure allows your thyroid gland to be removed through incisions in your armpit, your chest, or high in your neck. Instruments similar to endoscopes provide a 3-dimensional picture of the surgical site. Dissecting instruments are controlled by devices similar to joysticks. These devices allow more accurate manipulation of the instruments. After the blood supply to the gland is removed, the gland is cut into several pieces and removed through the incisions. RISKS AND COMPLICATIONS Complications associated with thyroidectomy are rare, but they can occur. Possible complications include:  A decrease in parathyroid hormone levels (hypoparathyroidism)  Your parathyroid glands are located close behind your thyroid gland. They are responsible for maintaining calcium levels inthe body. If they are damaged or removed, levels of calcium in the blood become low and nerves become irritable, which can cause muscle spasms. Medicines are available to treat this.  Bacterial infection This can often be treated with medicines that kill bacteria (antibiotics).  Damage to your voice box nerves This could cause  hoarseness or complete loss of voice.  Bleeding or airway obstruction. AFTER THE PROCEDURE   You will rest in the recovery room as you wake up.  When you first wake up, your throat may feel slightly sore.  You will not be allowed to eat or drink until instructed otherwise.  You will be taken to your hospital room. You will usually stay at the hospital for 1 or 2 nights.  If a drain is placed during the procedure, it usually is removed the next day.  You may have some mild neck pain.  Your voice may be weak. This usually is temporary. Document Released: 03/06/2001 Document Revised: 01/05/2013 Document Reviewed: 12/13/2010 [facility] Patient Information 2014 [place], [place].

## 2013-10-01 NOTE — Progress Notes (Addendum)
Patient ID: Jean DoeQuionna M Seoane, female   DOB: [birth year], 28 y.o.   MRN: [id number]  Chief Complaint  Patient presents with  . Goiter    HPI Jean Rowe is a 28 y.o. female.  She is referred by Dr. Lucianne MussKumar for evaluation and management of a symptomatic multinodular goiter. Dr. Burnell BlanksMaura Hamrick in [place] is her PCP. [facility].    The patient has noted thyroid symptoms for 5 years. She says she was repeatedly treated for strep throat and she thinks it was her thyroid gland all along. She has slowly developed enlargement of the gland, somewhat more on the right than the left. She has some swallowing problems with mild dysphagea and noisy swallowing, although there is no pain with swallowing. She does feel pressure in her neck when she sleeps at night. She also cannot sleep on her back and  she feels like she gets choked and either has to prop up on a pillow or lie on her side. She says her voice is variable sometimes normal sometimes a little raspy.  Thyroid function test on 06/23/2013 suggested hyperthyroidism with a TSH of 0.184. Dr. Lucianne MussKumar repeated this and the TSH, T4, and free T3 were completely normal suggesting that she was euthyroid and would not respond to radioiodine ablation. A CT scan of the neck showed enlarged multinodular thyroid goiter , right side greater than left, slight tracheal deviation to the left. No dominant lesions to suggest cancer. No adenopathy. No calcifications.  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 Panic attacks and insomnia.  History is significant for thyroidectomy in mother, maternal uncle, and maternal grandmother. These were all for benign goiter. I discussed this with her mother on the phone today and confirms the absence of any neoplastic disease. There is no family history of any endocrine neoplasia syndrome. Family history is also positive for heart disease and  hypertension.  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 She smokes 2 or 3 cigars a day. Drink alcohol rarely. HPI  Past Medical History  Diagnosis Date  . Thyroid disease   . Neurocardiogenic syncope     Past Surgical History  Procedure Laterality Date  . Pacemaker insertion  09/2004    Family History  Problem Relation Age of Onset  . Thyroid disease Mother   . Heart disease Mother   . Thyroid disease Maternal Uncle   . Thyroid disease Maternal Grandmother     Social History History  Substance Use Topics  . Smoking status: Current Every Day Smoker    Types: Cigars  . Smokeless tobacco: Not on file  . Alcohol Use: Yes     Comment: occas.    Allergies  Allergen Reactions  . Banana Anaphylaxis    Swelling of throat  . Penicillins   . Latex Rash    Current Outpatient Prescriptions  Medication Sig Dispense Refill  . ALPRAZolam (XANAX) 0.25 MG tablet Take 0.25 mg by mouth at bedtime as needed for sleep.      . benzoyl peroxide (BREVOXYL) 4 % lotion Apply topically 2 (two) times daily.      . cyclobenzaprine (FLEXERIL) 10 MG tablet Take 10 mg by mouth 2 (two) times daily as needed for muscle spasms.      Marland Kitchen. HYDROcodone-acetaminophen (NORCO/VICODIN) 5-325 MG per tablet Take 1 tablet by mouth every 8 (eight) hours as needed for pain.      .Marland Kitchen  traMADol (ULTRAM) 50 MG tablet Take 50 mg by mouth daily.      . traZODone (DESYREL) 50 MG tablet Take 50 mg by mouth at bedtime.       No current facility-administered medications for this visit.    Review of Systems Review of Systems  Constitutional: Negative for fever, chills and unexpected weight change.  HENT: Positive for trouble swallowing and voice change. Negative for congestion, hearing loss and sore throat.   Eyes: Negative for visual disturbance.  Respiratory: Positive for choking. Negative for cough and wheezing.   Cardiovascular: Negative for chest  pain, palpitations and leg swelling.  Gastrointestinal: Negative for nausea, vomiting, abdominal pain, diarrhea, constipation, blood in stool, abdominal distention and anal bleeding.  Genitourinary: Negative for hematuria, vaginal bleeding and difficulty urinating.  Musculoskeletal: Negative for arthralgias.  Skin: Negative for rash and wound.  Neurological: Positive for syncope. Negative for seizures and headaches.       Syncope was apparently due to sinus pauses. Correct pacemaker  Hematological: Negative for adenopathy. Does not bruise/bleed easily.  Psychiatric/Behavioral: Negative for confusion.    Blood pressure 120/80, pulse 78, temperature 98.3 F (36.8 C), temperature source Oral, resp. rate 24, height 5\' 1"  (1.549 m), weight 139 lb (63.05 kg).  Physical Exam Physical Exam  Constitutional: She is oriented to person, place, and time. She appears well-developed and well-nourished. No distress.  HENT:  Head: Normocephalic and atraumatic.  Nose: Nose normal.  Mouth/Throat: No oropharyngeal exudate.  Voice sounds normal.neck reveals obvious goiter, a right slightly larger than left. Soft. Trachea appears to be in the midline. No adenopathy.  Eyes: Conjunctivae and EOM are normal. Pupils are equal, round, and reactive to light. Left eye exhibits no discharge. No scleral icterus.  Neck: Neck supple. No JVD present. No tracheal deviation present. No thyromegaly present.  Cardiovascular: Normal rate, regular rhythm, normal heart sounds and intact distal pulses.   No murmur heard. Pulmonary/Chest: Effort normal and breath sounds normal. No respiratory distress. She has no wheezes. She has no rales. She exhibits no tenderness.  Pacemaker left infraclavicular area.  Abdominal: Soft. Bowel sounds are normal. She exhibits no distension and no mass. There is no tenderness. There is no rebound and no guarding.  Musculoskeletal: She exhibits no edema and no tenderness.  Lymphadenopathy:    She  has no cervical adenopathy.  Neurological: She is alert and oriented to person, place, and time. She exhibits normal muscle tone. Coordination normal.  Skin: Skin is warm. No rash noted. She is not diaphoretic. No erythema. No pallor.  Psychiatric: She has a normal mood and affect. Her behavior is normal. Judgment and thought content normal.    Data Reviewed Dr. Remus Blake office notes. Laboratory work testing. CT scan neck.  Assessment    Benign multinodular goiter with progressive pressure symptoms involving airway and esophagus.Euthyroid.  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 And.     Plan    She would like to proceed with elective total thyroidectomy in the near future, and that will be scheduled.  I discussed the indications, techniques, and numerous risks of surgery with her. She's aware of the risk of bleeding, infection, parathyroid injury with temporary or permanent hypo-calcemia, injury to the recurrent laryngeal nerves with temporary or permanent hoarseness. She is aware that  she will need to take thyroid hormone replacement lifelong. She understands all these issues. All of her questions are answered. She agrees with this plan and is very much in favor of proceeding with surgery.  We will request cardiac risk assessment preop from [facility].  ADDENDUM:  Dr. Royann Shivers has advised:        "Low risk for CV complications with thyroid surgery. Will send over a letter re: pacemaker management perioperatively. She is not pacemaker dependent, but it may be best to place a magnet over the device to avoid electrocautery related unnecessary pacemaker intervention (it might cause sudden tachycardia)."        Angelia Mould. Derrell Lolling, M.D.,  [facility] General and Minimally invasive Surgery Breast and Colorectal Surgery Office:   [phone] Pager:   [phone]  10/01/2013, 9:51 AM

## 2013-10-06 ENCOUNTER — Telehealth (INDEPENDENT_AMBULATORY_CARE_PROVIDER_SITE_OTHER): Payer: Self-pay | Admitting: General Surgery

## 2013-10-06 NOTE — Telephone Encounter (Signed)
Pt called into surgery scheduling requesting surgery date.  EPIC orders in system need face sheet or surgical orders  Thanks

## 2013-10-06 NOTE — Telephone Encounter (Signed)
We are waiting on cardiac clearance

## 2013-10-06 NOTE — Telephone Encounter (Signed)
Done,done,done.  hmi

## 2013-10-06 NOTE — Telephone Encounter (Signed)
Cardiologist has set a clearance note. I added to my last office note as an addendum. We may proceed with scheduling her surgery as soon as possible.  I thought we did all the paperwork when she was in the office. Let me know if I need to fill out any more forms were inserted in the orders.  hmi

## 2013-10-22 ENCOUNTER — Encounter (HOSPITAL_COMMUNITY): Payer: Self-pay | Admitting: Pharmacy Technician

## 2013-10-26 ENCOUNTER — Encounter (HOSPITAL_COMMUNITY)
Admission: RE | Admit: 2013-10-26 | Discharge: 2013-10-26 | Disposition: A | Discharge status: Home / Self Care | Payer: Medicaid Other | Source: Ambulatory Visit | Attending: General Surgery | Admitting: General Surgery

## 2013-10-26 ENCOUNTER — Other Ambulatory Visit (HOSPITAL_COMMUNITY): Payer: Self-pay | Admitting: *Deleted

## 2013-10-26 ENCOUNTER — Encounter (HOSPITAL_COMMUNITY): Payer: Self-pay

## 2013-10-26 ENCOUNTER — Ambulatory Visit (HOSPITAL_COMMUNITY)
Admission: RE | Admit: 2013-10-26 | Discharge: 2013-10-26 | Disposition: A | Discharge status: Home / Self Care | Payer: Medicaid Other | Source: Ambulatory Visit | Attending: General Surgery | Admitting: General Surgery

## 2013-10-26 DIAGNOSIS — Z950 Presence of cardiac pacemaker: Secondary | ICD-10-CM | POA: Insufficient documentation

## 2013-10-26 DIAGNOSIS — Z01818 Encounter for other preprocedural examination: Secondary | ICD-10-CM | POA: Insufficient documentation

## 2013-10-26 DIAGNOSIS — Z01812 Encounter for preprocedural laboratory examination: Secondary | ICD-10-CM | POA: Insufficient documentation

## 2013-10-26 HISTORY — DX: Presence of cardiac pacemaker: Z95.0

## 2013-10-26 HISTORY — DX: Personal history of urinary (tract) infections: Z87.440

## 2013-10-26 HISTORY — DX: Anxiety disorder, unspecified: F41.9

## 2013-10-26 HISTORY — DX: Acne, unspecified: L70.9

## 2013-10-26 LAB — CBC WITH DIFFERENTIAL/PLATELET
BASOS ABS: 0 10*3/uL (ref 0.0–0.1)
Basophils Relative: 0 % (ref 0–1)
EOS ABS: 0.1 10*3/uL (ref 0.0–0.7)
EOS PCT: 1 % (ref 0–5)
HCT: 36.9 % (ref 36.0–46.0)
Hemoglobin: 13 g/dL (ref 12.0–15.0)
LYMPHS PCT: 23 % (ref 12–46)
Lymphs Abs: 2.7 10*3/uL (ref 0.7–4.0)
MCH: 32.6 pg (ref 26.0–34.0)
MCHC: 35.2 g/dL (ref 30.0–36.0)
MCV: 92.5 fL (ref 78.0–100.0)
Monocytes Absolute: 0.5 10*3/uL (ref 0.1–1.0)
Monocytes Relative: 5 % (ref 3–12)
NEUTROS PCT: 71 % (ref 43–77)
Neutro Abs: 8.1 10*3/uL — ABNORMAL HIGH (ref 1.7–7.7)
PLATELETS: 250 10*3/uL (ref 150–400)
RBC: 3.99 MIL/uL (ref 3.87–5.11)
RDW: 12.7 % (ref 11.5–15.5)
WBC: 11.5 10*3/uL — ABNORMAL HIGH (ref 4.0–10.5)

## 2013-10-26 LAB — COMPREHENSIVE METABOLIC PANEL
ALT: 14 U/L (ref 0–35)
AST: 16 U/L (ref 0–37)
Albumin: 3.7 g/dL (ref 3.5–5.2)
Alkaline Phosphatase: 75 U/L (ref 39–117)
BUN: 12 mg/dL (ref 6–23)
CO2: 23 meq/L (ref 19–32)
Calcium: 8.9 mg/dL (ref 8.4–10.5)
Chloride: 103 mEq/L (ref 96–112)
Creatinine, Ser: 0.67 mg/dL (ref 0.50–1.10)
GFR calc Af Amer: >90 mL/min (ref >90)
GFR calc non Af Amer: >90 mL/min (ref >90)
Glucose, Bld: 83 mg/dL (ref 70–99)
POTASSIUM: 3.7 meq/L (ref 3.7–5.3)
SODIUM: 139 meq/L (ref 137–147)
TOTAL PROTEIN: 7.2 g/dL (ref 6.0–8.3)
Total Bilirubin: 0.2 mg/dL — ABNORMAL LOW (ref 0.3–1.2)

## 2013-10-26 LAB — HCG, SERUM, QUALITATIVE: PREG SERUM: NEGATIVE

## 2013-10-26 NOTE — Pre-Procedure Instructions (Signed)
Nkechi M Reveron  10/26/2013   Your procedure is scheduled on:  Wednesday, November 04, 2013 at 8:30 AM.   [facility] Admitting Office at 6:30 AM.   Call this number if you have problems the morning of surgery: [phone]   Remember:   Do not eat food or drink liquids after midnight Tuesday, 11/03/13.   Take these medicines the morning of surgery with A SIP OF WATER: cyclobenzaprine (FLEXERIL) - if needed, HYDROcodone-acetaminophen (NORCO/VICODIN) - if needed.  Stop Meloxicam ( Mobic ) as of Wednesday, 10/28/13. Do not take any NSAIDS (Ibuprofen, Aleve, etc.) prior to surgery     Do not wear jewelry, make-up or nail polish.  Do not wear lotions, powders, or perfumes. You may wear deodorant.  Do not shave 48 hours prior to surgery.   Do not bring valuables to the hospital.  [facility] is not responsible                  for any belongings or valuables.               Contacts, dentures or bridgework may not be worn into surgery.  Leave suitcase in the car. After surgery it may be brought to your room.  For patients admitted to the hospital, discharge time is determined by your                treatment team.               Special Instructions: Shower with CHG soap the night before surgery and the morning of surgery.   Please read over the following fact sheets that you were given: Pain Booklet, Coughing and Deep Breathing and Surgical Site Infection Prevention

## 2013-10-28 NOTE — Progress Notes (Signed)
Peri-operative implanted cardiac device orders placed on chart.

## 2013-11-02 NOTE — H&P (Signed)
Jean Rowe   MRN:  [id number]   Description: 28 year old female  Provider: Ernestene MentionHaywood M Cartez Mogle, MD  Department: [facility]          Diagnoses      Goiter    -  Primary      240.9            Current Vitals - Last Recorded      BP Pulse Temp(Src) Resp Ht Wt      120/80 78 98.3 F (36.8 C) (Oral) 24 5\' 1"  (1.549 m) 139 lb (63.05 kg)      BMI  26.28 kg/m2                      History and physical      Ernestene MentionHaywood M Seng Fouts, MD    Status: Addendum            Patient ID: Jean Rowe, female   DOB: 1986-07-24, 28 y.o.   MRN: [id number]              HPI Jean Rowe is a 28 y.o. female.  She is referred by Dr. Lucianne MussKumar for evaluation and management of a symptomatic multinodular goiter. Dr. Burnell [place]aura Ham[place] is [place] PCP. [facility].     The patient has noted thyroid symptoms for 5 years. She says she was repeatedly treated for strep throat and she takes it was her thyroid gland all along. She has slowly developed enlargement of thyroid Reglan, somewhat more on the right than the left. She has some swallowing problems with dropping and noisy swallowing, although there is no pain with swallowing. She does feel pressure in her neck when she. She also cannot sleep lateral her back she feels like she gets choked and either has to prop up on a pillow or lie on her side. She says her voice is variable sometimes normal sometimes a little raspy.   Thyroid function test on 06/23/2013 suggested hyperthyroidism with a TSH of 0.184. Dr. Lucianne MussKumar repeated this and the TSH, T4, and free T3 were completely normal suggesting that she was euthyroid and would not respond to radioiodine ablation. A CT scan of the neck showed enlarged multinodular thyroid goiter , right side greater than left, slight tracheal deviation to the left. No dominant lesions to suggest cancer. No adenopathy. No  calcifications.   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   History is significant for thyroidectomy in mother, maternal uncle, and maternal grandmother. These were all for benign goiter. I discussed this with her mother on the phone today and confirms the absence of any neoplastic disease. There is no family history of any endocrine neoplasia syndrome. Family history is also positive for heart disease and hypertension.   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 She smokes 2 or 3 cigars a day. Drink alcohol rarely.        Past Medical History   Diagnosis  Date   .  Thyroid disease     .  Neurocardiogenic syncope           Past Surgical History   Procedure  Laterality  Date   .  Pacemaker insertion    09/2004         Family History   Problem  Relation  Age of Onset   .  Thyroid disease  Mother     .  Heart disease  Mother     .  Thyroid disease  Maternal Uncle     .  Thyroid disease  Maternal Grandmother          Social History History   Substance Use Topics   .  Smoking status:  Current Every Day Smoker       Types:  Cigars   .  Smokeless tobacco:  Not on file   .  Alcohol Use:  Yes         Comment: occas.         Allergies   Allergen  Reactions   .  Banana  Anaphylaxis       Swelling of throat   .  Penicillins     .  Latex  Rash         Current Outpatient Prescriptions   Medication  Sig  Dispense  Refill   .  ALPRAZolam (XANAX) 0.25 MG tablet  Take 0.25 mg by mouth at bedtime as needed for sleep.         .  benzoyl peroxide (BREVOXYL) 4 % lotion  Apply topically 2 (two) times daily.         .  cyclobenzaprine (FLEXERIL) 10 MG tablet  Take 10 mg by mouth 2 (two) times daily as needed for muscle spasms.         Marland Kitchen  HYDROcodone-acetaminophen (NORCO/VICODIN) 5-325 MG per tablet  Take 1 tablet by mouth every 8 (eight) hours as needed  for pain.         .  traMADol (ULTRAM) 50 MG tablet  Take 50 mg by mouth daily.         .  traZODone (DESYREL) 50 MG tablet  Take 50 mg by mouth at bedtime.               Review of Systems  Constitutional: Negative for fever, chills and unexpected weight change.  HENT: Positive for trouble swallowing and voice change. Negative for congestion, hearing loss and sore throat.   Eyes: Negative for visual disturbance.  Respiratory: Positive for choking. Negative for cough and wheezing.   Cardiovascular: Negative for chest pain, palpitations and leg swelling.  Gastrointestinal: Negative for nausea, vomiting, abdominal pain, diarrhea, constipation, blood in stool, abdominal distention and anal bleeding.  Genitourinary: Negative for hematuria, vaginal bleeding and difficulty urinating.  Musculoskeletal: Negative for arthralgias.  Skin: Negative for rash and wound.  Neurological: Positive for syncope. Negative for seizures and headaches.        Syncope was apparently due to sinus pauses. Correct pacemaker  Hematological: Negative for adenopathy. Does not bruise/bleed easily.  Psychiatric/Behavioral: Negative for confusion.      Blood pressure 120/80, pulse 78, temperature 98.3 F (36.8 C), temperature source Oral, resp. rate 24, height 5\' 1"  (1.549 m), weight 139 lb (63.05 kg).   Physical Exam  Constitutional: She is oriented to person, place, and time. She appears well-developed and well-nourished. No distress.  HENT:   Head: Normocephalic and atraumatic.   Nose: Nose normal.   Mouth/Throat: No oropharyngeal exudate.  Voice sounds normal.neck reveals obvious goiter, a right slightly larger than left. Soft. Trachea appears to be in the midline. No adenopathy.  Eyes: Conjunctivae and EOM are normal. Pupils are equal, round, and reactive to light. Left eye exhibits no discharge. No scleral icterus.  Neck: Neck supple. No JVD present. Minimal tracheal  deviation present. Significant soft  thyromegaly, right greater than left. Cardiovascular: Normal rate, regular rhythm, normal heart sounds and intact distal pulses.    No murmur heard. Pulmonary/Chest: Effort normal and breath sounds normal. No respiratory distress. She has no wheezes. She has no rales. She exhibits no tenderness.  Pacemaker left infraclavicular area.  Abdominal: Soft. Bowel sounds are normal. She exhibits no distension and no mass. There is no tenderness. There is no rebound and no guarding.  Musculoskeletal: She exhibits no edema and no tenderness.  Lymphadenopathy:    She has no cervical adenopathy.  Neurological: She is alert and oriented to person, place, and time. She exhibits normal muscle tone. Coordination normal.  Skin: Skin is warm. No rash noted. She is not diaphoretic. No erythema. No pallor.  Psychiatric: She has a normal mood and affect. Her behavior is normal. Judgment and thought content normal.      Data Reviewed Dr. Remus Blake office notes. Laboratory work testing. CT scan neck.   Assessment    Benign multinodular goiter with progressive pressure symptoms involving airway and esophagus.Euthyroid.  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 And.      Plan    She would like to proceed with elective total thyroidectomy in the near future, and that will be scheduled.   I discussed the indications, techniques, and numerous risks of surgery with her. She's aware of the risk of bleeding, infection, parathyroid injury with temporary or permanent hypo-calcium you, injury to the recurrent laryngeal nerves as temporary or permanent hoarseness. She is aware that she will need to take thyroid hormone replacement lifelong. She understands all these issues. All of her questions  are answered. She agrees with this plan and is very much in favor of proceeding with surgery.   We will request cardiac risk assessment preop from [facility].  ADDENDUM:  Dr. Royann Shivers has advised:        "Low risk for CV complications with thyroid surgery. Will send over a letter re: pacemaker management perioperatively. She is not pacemaker dependent, but it may be best to place a magnet over the device to avoid electrocautery related unnecessary pacemaker intervention (it might cause sudden tachycardia)."          Angelia Mould. Derrell Lolling, M.D., [facility] General and Minimally invasive Surgery Breast and Colorectal Surgery Office:   [phone] Pager:   [phone]

## 2013-11-03 MED ORDER — VANCOMYCIN HCL IN DEXTROSE 1-5 GM/200ML-% IV SOLN
1000.0000 mg | Freq: Once | INTRAVENOUS | Status: AC
  Administered 2013-11-04: 1000 mg via INTRAVENOUS
  Filled 2013-11-03 (×2): qty 200

## 2013-11-03 MED ORDER — CHLORHEXIDINE GLUCONATE 4 % EX LIQD
1.0000 "application " | Freq: Once | CUTANEOUS | Status: DC
  Filled 2013-11-03: qty 15

## 2013-11-03 MED ORDER — CEFAZOLIN SODIUM-DEXTROSE 2-3 GM-% IV SOLR: 2.0000 g | INTRAVENOUS | Status: DC

## 2013-11-04 ENCOUNTER — Encounter (HOSPITAL_COMMUNITY): Payer: Self-pay | Admitting: Certified Registered Nurse Anesthetist

## 2013-11-04 ENCOUNTER — Ambulatory Visit (HOSPITAL_COMMUNITY): Payer: Medicaid Other | Admitting: Certified Registered Nurse Anesthetist

## 2013-11-04 ENCOUNTER — Encounter (HOSPITAL_COMMUNITY)
Admission: RE | Disposition: A | Discharge status: Home / Self Care | Payer: Self-pay | Source: Ambulatory Visit | Attending: General Surgery

## 2013-11-04 ENCOUNTER — Encounter (HOSPITAL_COMMUNITY): Payer: Medicaid Other | Admitting: Vascular Surgery

## 2013-11-04 ENCOUNTER — Ambulatory Visit (HOSPITAL_COMMUNITY)
Admission: RE | Admit: 2013-11-04 | Discharge: 2013-11-05 | Disposition: A | Discharge status: Home / Self Care | Payer: Medicaid Other | Source: Ambulatory Visit | Attending: General Surgery | Admitting: General Surgery

## 2013-11-04 DIAGNOSIS — Z23 Encounter for immunization: Secondary | ICD-10-CM | POA: Insufficient documentation

## 2013-11-04 DIAGNOSIS — E049 Nontoxic goiter, unspecified: Secondary | ICD-10-CM | POA: Diagnosis present

## 2013-11-04 DIAGNOSIS — F17200 Nicotine dependence, unspecified, uncomplicated: Secondary | ICD-10-CM | POA: Insufficient documentation

## 2013-11-04 DIAGNOSIS — Z950 Presence of cardiac pacemaker: Secondary | ICD-10-CM | POA: Insufficient documentation

## 2013-11-04 DIAGNOSIS — E042 Nontoxic multinodular goiter: Secondary | ICD-10-CM

## 2013-11-04 DIAGNOSIS — G4700 Insomnia, unspecified: Secondary | ICD-10-CM | POA: Insufficient documentation

## 2013-11-04 DIAGNOSIS — F410 Panic disorder [episodic paroxysmal anxiety] without agoraphobia: Secondary | ICD-10-CM | POA: Insufficient documentation

## 2013-11-04 HISTORY — DX: Cardiac murmur, unspecified: R01.1

## 2013-11-04 HISTORY — PX: THYROIDECTOMY: SHX17

## 2013-11-04 HISTORY — DX: Headache: R51

## 2013-11-04 HISTORY — DX: Hypothyroidism, unspecified: E03.9

## 2013-11-04 HISTORY — DX: Headache, unspecified: R51.9

## 2013-11-04 HISTORY — DX: Orthopnea: R06.01

## 2013-11-04 LAB — CALCIUM

## 2013-11-04 LAB — CBC
HCT: 35.1 % — ABNORMAL LOW (ref 36.0–46.0)
Hemoglobin: 12.1 g/dL (ref 12.0–15.0)
MCH: 32.4 pg (ref 26.0–34.0)
MCHC: 34.5 g/dL (ref 30.0–36.0)
MCV: 93.9 fL (ref 78.0–100.0)
PLATELETS: 238 10*3/uL (ref 150–400)
RBC: 3.74 MIL/uL — AB (ref 3.87–5.11)
RDW: 12.8 % (ref 11.5–15.5)
WBC: 15.1 10*3/uL — ABNORMAL HIGH (ref 4.0–10.5)

## 2013-11-04 LAB — CREATININE, SERUM
CREATININE: 0.72 mg/dL (ref 0.50–1.10)
GFR calc Af Amer: >90 mL/min (ref >90)
GFR calc non Af Amer: >90 mL/min (ref >90)

## 2013-11-04 SURGERY — THYROIDECTOMY
Anesthesia: General | Site: Neck

## 2013-11-04 MED ORDER — PHENYLEPHRINE HCL 10 MG/ML IJ SOLN
INTRAMUSCULAR | Status: DC | PRN
  Administered 2013-11-04 (×5): 40 ug via INTRAVENOUS
  Administered 2013-11-04: 80 ug via INTRAVENOUS
  Administered 2013-11-04: 40 ug via INTRAVENOUS

## 2013-11-04 MED ORDER — FENTANYL CITRATE 0.05 MG/ML IJ SOLN: 50.0000 ug | Freq: Once | INTRAMUSCULAR | Status: DC

## 2013-11-04 MED ORDER — CYCLOBENZAPRINE HCL 10 MG PO TABS
10.0000 mg | ORAL_TABLET | Freq: Two times a day (BID) | ORAL | Status: DC | PRN
  Administered 2013-11-04 – 2013-11-05 (×3): 10 mg via ORAL
  Filled 2013-11-04 (×3): qty 1

## 2013-11-04 MED ORDER — ONDANSETRON HCL 4 MG PO TABS: 4.0000 mg | ORAL_TABLET | Freq: Four times a day (QID) | ORAL | Status: DC | PRN

## 2013-11-04 MED ORDER — POTASSIUM CHLORIDE IN NACL 20-0.9 MEQ/L-% IV SOLN
INTRAVENOUS | Status: DC
  Administered 2013-11-04 – 2013-11-05 (×2): via INTRAVENOUS
  Filled 2013-11-04 (×5): qty 1000

## 2013-11-04 MED ORDER — LIDOCAINE HCL (CARDIAC) 20 MG/ML IV SOLN
INTRAVENOUS | Status: AC
  Filled 2013-11-04: qty 10

## 2013-11-04 MED ORDER — PROPOFOL 10 MG/ML IV BOLUS
INTRAVENOUS | Status: DC | PRN
  Administered 2013-11-04: 180 mg via INTRAVENOUS

## 2013-11-04 MED ORDER — PNEUMOCOCCAL VAC POLYVALENT 25 MCG/0.5ML IJ INJ
0.5000 mL | INJECTION | INTRAMUSCULAR | Status: AC
  Administered 2013-11-05: 0.5 mL via INTRAMUSCULAR
  Filled 2013-11-04: qty 0.5

## 2013-11-04 MED ORDER — INFLUENZA VAC SPLIT QUAD 0.5 ML IM SUSP
0.5000 mL | INTRAMUSCULAR | Status: AC
  Administered 2013-11-05: 0.5 mL via INTRAMUSCULAR
  Filled 2013-11-04: qty 0.5

## 2013-11-04 MED ORDER — GLYCOPYRROLATE 0.2 MG/ML IJ SOLN
INTRAMUSCULAR | Status: DC | PRN
  Administered 2013-11-04: .6 mg via INTRAVENOUS

## 2013-11-04 MED ORDER — ONDANSETRON HCL 4 MG/2ML IJ SOLN
INTRAMUSCULAR | Status: AC
  Filled 2013-11-04: qty 2

## 2013-11-04 MED ORDER — TRAZODONE HCL 50 MG PO TABS
50.0000 mg | ORAL_TABLET | Freq: Every day | ORAL | Status: DC
  Administered 2013-11-04: 50 mg via ORAL
  Filled 2013-11-04 (×2): qty 1

## 2013-11-04 MED ORDER — LEVOTHYROXINE SODIUM 75 MCG PO TABS
75.0000 ug | ORAL_TABLET | Freq: Every day | ORAL | Status: DC
  Administered 2013-11-05: 75 ug via ORAL
  Filled 2013-11-04 (×2): qty 1

## 2013-11-04 MED ORDER — GLYCOPYRROLATE 0.2 MG/ML IJ SOLN
INTRAMUSCULAR | Status: AC
  Filled 2013-11-04: qty 3

## 2013-11-04 MED ORDER — MIDAZOLAM HCL 5 MG/5ML IJ SOLN
INTRAMUSCULAR | Status: DC | PRN
  Administered 2013-11-04: 2 mg via INTRAVENOUS

## 2013-11-04 MED ORDER — ESMOLOL HCL 10 MG/ML IV SOLN
INTRAVENOUS | Status: AC
  Filled 2013-11-04: qty 10

## 2013-11-04 MED ORDER — DEXAMETHASONE SODIUM PHOSPHATE 4 MG/ML IJ SOLN
INTRAMUSCULAR | Status: DC | PRN
  Administered 2013-11-04: 4 mg via INTRAVENOUS

## 2013-11-04 MED ORDER — ONDANSETRON HCL 4 MG/2ML IJ SOLN
INTRAMUSCULAR | Status: DC | PRN
  Administered 2013-11-04: 4 mg via INTRAVENOUS

## 2013-11-04 MED ORDER — LIDOCAINE HCL (CARDIAC) 20 MG/ML IV SOLN
INTRAVENOUS | Status: DC | PRN
  Administered 2013-11-04: 80 mg via INTRAVENOUS

## 2013-11-04 MED ORDER — DEXAMETHASONE SODIUM PHOSPHATE 4 MG/ML IJ SOLN
INTRAMUSCULAR | Status: AC
  Filled 2013-11-04: qty 1

## 2013-11-04 MED ORDER — ONDANSETRON HCL 4 MG/2ML IJ SOLN: 4.0000 mg | Freq: Four times a day (QID) | INTRAMUSCULAR | Status: DC | PRN

## 2013-11-04 MED ORDER — MIDAZOLAM HCL 2 MG/2ML IJ SOLN
INTRAMUSCULAR | Status: AC
  Filled 2013-11-04: qty 2

## 2013-11-04 MED ORDER — CALCIUM CARBONATE-VITAMIN D 500-200 MG-UNIT PO TABS
2.0000 | ORAL_TABLET | Freq: Three times a day (TID) | ORAL | Status: DC
  Administered 2013-11-04 – 2013-11-05 (×4): 2 via ORAL
  Filled 2013-11-04 (×5): qty 2

## 2013-11-04 MED ORDER — HEMOSTATIC AGENTS (NO CHARGE) OPTIME
TOPICAL | Status: DC | PRN
  Administered 2013-11-04: 1 via TOPICAL

## 2013-11-04 MED ORDER — FENTANYL CITRATE 0.05 MG/ML IJ SOLN
INTRAMUSCULAR | Status: DC | PRN
  Administered 2013-11-04: 100 ug via INTRAVENOUS
  Administered 2013-11-04 (×3): 50 ug via INTRAVENOUS

## 2013-11-04 MED ORDER — OXYCODONE HCL 5 MG PO TABS: 5.0000 mg | ORAL_TABLET | Freq: Once | ORAL | Status: DC | PRN

## 2013-11-04 MED ORDER — MIDAZOLAM HCL 2 MG/2ML IJ SOLN: 1.0000 mg | INTRAMUSCULAR | Status: DC | PRN

## 2013-11-04 MED ORDER — ROCURONIUM BROMIDE 100 MG/10ML IV SOLN
INTRAVENOUS | Status: DC | PRN
  Administered 2013-11-04: 40 mg via INTRAVENOUS

## 2013-11-04 MED ORDER — LIDOCAINE HCL 4 % MT SOLN
OROMUCOSAL | Status: DC | PRN
  Administered 2013-11-04: 4 mL via TOPICAL

## 2013-11-04 MED ORDER — ESMOLOL HCL 10 MG/ML IV SOLN
INTRAVENOUS | Status: DC | PRN
  Administered 2013-11-04 (×2): 30 mg via INTRAVENOUS
  Administered 2013-11-04 (×6): 20 mg via INTRAVENOUS

## 2013-11-04 MED ORDER — OXYCODONE-ACETAMINOPHEN 5-325 MG PO TABS
1.0000 | ORAL_TABLET | ORAL | Status: DC | PRN
  Administered 2013-11-04 – 2013-11-05 (×4): 2 via ORAL
  Filled 2013-11-04 (×4): qty 2

## 2013-11-04 MED ORDER — PROMETHAZINE HCL 25 MG/ML IJ SOLN: 6.2500 mg | INTRAMUSCULAR | Status: DC | PRN

## 2013-11-04 MED ORDER — PROPOFOL 10 MG/ML IV BOLUS
INTRAVENOUS | Status: AC
  Filled 2013-11-04: qty 20

## 2013-11-04 MED ORDER — LACTATED RINGERS IV SOLN
INTRAVENOUS | Status: DC | PRN
  Administered 2013-11-04 (×2): via INTRAVENOUS

## 2013-11-04 MED ORDER — FENTANYL CITRATE 0.05 MG/ML IJ SOLN
INTRAMUSCULAR | Status: AC
  Filled 2013-11-04: qty 5

## 2013-11-04 MED ORDER — PHENYLEPHRINE 40 MCG/ML (10ML) SYRINGE FOR IV PUSH (FOR BLOOD PRESSURE SUPPORT)
PREFILLED_SYRINGE | INTRAVENOUS | Status: AC
  Filled 2013-11-04: qty 10

## 2013-11-04 MED ORDER — BUPIVACAINE-EPINEPHRINE PF 0.5-1:200000 % IJ SOLN
INTRAMUSCULAR | Status: DC | PRN
Start: 2013-11-04 — End: 2013-11-04
  Administered 2013-11-04: 5 mL

## 2013-11-04 MED ORDER — NEOSTIGMINE METHYLSULFATE 1 MG/ML IJ SOLN
INTRAMUSCULAR | Status: AC
  Filled 2013-11-04: qty 10

## 2013-11-04 MED ORDER — HYDROMORPHONE HCL PF 1 MG/ML IJ SOLN
INTRAMUSCULAR | Status: AC
  Filled 2013-11-04: qty 1

## 2013-11-04 MED ORDER — STERILE WATER FOR INJECTION IJ SOLN
INTRAMUSCULAR | Status: AC
  Filled 2013-11-04: qty 10

## 2013-11-04 MED ORDER — HYDROMORPHONE HCL PF 1 MG/ML IJ SOLN
0.2500 mg | INTRAMUSCULAR | Status: DC | PRN
  Administered 2013-11-04 (×2): 0.5 mg via INTRAVENOUS

## 2013-11-04 MED ORDER — ENOXAPARIN SODIUM 40 MG/0.4ML ~~LOC~~ SOLN
40.0000 mg | SUBCUTANEOUS | Status: DC
  Administered 2013-11-05: 40 mg via SUBCUTANEOUS
  Filled 2013-11-04 (×2): qty 0.4

## 2013-11-04 MED ORDER — ALPRAZOLAM 0.25 MG PO TABS: 0.2500 mg | ORAL_TABLET | Freq: Every evening | ORAL | Status: DC | PRN

## 2013-11-04 MED ORDER — OXYCODONE HCL 5 MG/5ML PO SOLN: 5.0000 mg | Freq: Once | ORAL | Status: DC | PRN

## 2013-11-04 MED ORDER — BUPIVACAINE-EPINEPHRINE (PF) 0.5% -1:200000 IJ SOLN
INTRAMUSCULAR | Status: AC
  Filled 2013-11-04: qty 10

## 2013-11-04 MED ORDER — NEOSTIGMINE METHYLSULFATE 1 MG/ML IJ SOLN
INTRAMUSCULAR | Status: DC | PRN
  Administered 2013-11-04: 4 mg via INTRAVENOUS

## 2013-11-04 MED ORDER — 0.9 % SODIUM CHLORIDE (POUR BTL) OPTIME
TOPICAL | Status: DC | PRN
  Administered 2013-11-04: 1000 mL

## 2013-11-04 MED ORDER — BENZOYL PEROXIDE 4 % EX LOTN: 1.0000 "application " | TOPICAL_LOTION | Freq: Two times a day (BID) | CUTANEOUS | Status: DC

## 2013-11-04 MED ORDER — ROCURONIUM BROMIDE 50 MG/5ML IV SOLN
INTRAVENOUS | Status: AC
  Filled 2013-11-04: qty 1

## 2013-11-04 MED ORDER — EPHEDRINE SULFATE 50 MG/ML IJ SOLN
INTRAMUSCULAR | Status: AC
  Filled 2013-11-04: qty 1

## 2013-11-04 MED ORDER — FENTANYL CITRATE 0.05 MG/ML IJ SOLN
25.0000 ug | INTRAMUSCULAR | Status: DC | PRN
Start: 2013-11-04 — End: 2013-11-05
  Administered 2013-11-04 – 2013-11-05 (×4): 50 ug via INTRAVENOUS
  Filled 2013-11-04 (×3): qty 2

## 2013-11-04 SURGICAL SUPPLY — 57 items
ADH SKN CLS APL DERMABOND .7 (GAUZE/BANDAGES/DRESSINGS) ×1
ATTRACTOMAT 16X20 MAGNETIC DRP (DRAPES) IMPLANT
BLADE SURG 10 STRL SS (BLADE) ×3 IMPLANT
BLADE SURG 15 STRL LF DISP TIS (BLADE) ×1 IMPLANT
BLADE SURG 15 STRL SS (BLADE) ×3
CANISTER SUCTION 2500CC (MISCELLANEOUS) ×3 IMPLANT
CHLORAPREP W/TINT 26ML (MISCELLANEOUS) ×3 IMPLANT
CLIP TI MEDIUM 24 (CLIP) ×3 IMPLANT
CLIP TI WIDE RED SMALL 24 (CLIP) ×3 IMPLANT
CONT SPEC 4OZ CLIKSEAL STRL BL (MISCELLANEOUS) IMPLANT
COVER SURGICAL LIGHT HANDLE (MISCELLANEOUS) ×3 IMPLANT
CRADLE DONUT ADULT HEAD (MISCELLANEOUS) ×3 IMPLANT
DERMABOND ADVANCED (GAUZE/BANDAGES/DRESSINGS) ×2
DERMABOND ADVANCED .7 DNX12 (GAUZE/BANDAGES/DRESSINGS) ×1 IMPLANT
DRAPE PED LAPAROTOMY (DRAPES) ×3 IMPLANT
DRAPE SURG 17X23 STRL (DRAPES) ×2 IMPLANT
DRAPE UTILITY 15X26 W/TAPE STR (DRAPE) ×6 IMPLANT
ELECT CAUTERY BLADE 6.4 (BLADE) ×3 IMPLANT
ELECT REM PT RETURN 9FT ADLT (ELECTROSURGICAL) ×3
ELECTRODE REM PT RTRN 9FT ADLT (ELECTROSURGICAL) ×1 IMPLANT
GAUZE SPONGE 4X4 16PLY XRAY LF (GAUZE/BANDAGES/DRESSINGS) ×7 IMPLANT
GLOVE BIOGEL PI IND STRL 7.0 (GLOVE) IMPLANT
GLOVE BIOGEL PI IND STRL 8 (GLOVE) IMPLANT
GLOVE BIOGEL PI INDICATOR 7.0 (GLOVE) ×4
GLOVE BIOGEL PI INDICATOR 8 (GLOVE) ×2
GLOVE SURG SS PI 6.5 STRL IVOR (GLOVE) ×2 IMPLANT
GLOVE SURG SS PI 7.0 STRL IVOR (GLOVE) ×8 IMPLANT
GLOVE SURG SS PI 8.0 STRL IVOR (GLOVE) ×2 IMPLANT
GOWN STRL NON-REIN LRG LVL3 (GOWN DISPOSABLE) ×4 IMPLANT
GOWN STRL REIN XL XLG (GOWN DISPOSABLE) ×7 IMPLANT
HEMOSTAT SURGICEL 2X14 (HEMOSTASIS) ×3 IMPLANT
HEMOSTAT SURGICEL 2X4 FIBR (HEMOSTASIS) ×2 IMPLANT
KIT BASIN OR (CUSTOM PROCEDURE TRAY) ×3 IMPLANT
KIT ROOM TURNOVER OR (KITS) ×3 IMPLANT
NDL HYPO 25GX1X1/2 BEV (NEEDLE) IMPLANT
NEEDLE HYPO 25GX1X1/2 BEV (NEEDLE) ×3 IMPLANT
NS IRRIG 1000ML POUR BTL (IV SOLUTION) ×3 IMPLANT
PACK SURGICAL SETUP 50X90 (CUSTOM PROCEDURE TRAY) ×3 IMPLANT
PAD ARMBOARD 7.5X6 YLW CONV (MISCELLANEOUS) ×6 IMPLANT
PENCIL BUTTON HOLSTER BLD 10FT (ELECTRODE) ×3 IMPLANT
SHEARS HARMONIC 9CM CVD (BLADE) ×3 IMPLANT
SPECIMEN JAR MEDIUM (MISCELLANEOUS) IMPLANT
SPONGE INTESTINAL PEANUT (DISPOSABLE) ×3 IMPLANT
SUT MNCRL AB 4-0 PS2 18 (SUTURE) ×3 IMPLANT
SUT SILK 2 0 (SUTURE) ×3
SUT SILK 2 0 SH (SUTURE) ×2 IMPLANT
SUT SILK 2-0 18XBRD TIE 12 (SUTURE) ×1 IMPLANT
SUT SILK 3 0 (SUTURE) ×3
SUT SILK 3-0 18XBRD TIE 12 (SUTURE) ×1 IMPLANT
SUT VIC AB 3-0 SH 18 (SUTURE) ×5 IMPLANT
SYR BULB 3OZ (MISCELLANEOUS) ×3 IMPLANT
SYR CONTROL 10ML LL (SYRINGE) ×2 IMPLANT
TOWEL OR 17X24 6PK STRL BLUE (TOWEL DISPOSABLE) ×3 IMPLANT
TOWEL OR 17X26 10 PK STRL BLUE (TOWEL DISPOSABLE) ×3 IMPLANT
TUBE CONNECTING 12'X1/4 (SUCTIONS) ×1
TUBE CONNECTING 12X1/4 (SUCTIONS) ×2 IMPLANT
WATER STERILE IRR 1000ML POUR (IV SOLUTION) IMPLANT

## 2013-11-04 NOTE — Preoperative (Signed)
Beta Blockers   Reason not to administer Beta Blockers:Not Applicable 

## 2013-11-04 NOTE — Anesthesia Procedure Notes (Signed)
Procedure Name: Intubation Date/Time: 11/04/2013 8:34 AM Performed by: Vita BarleyURNER, Ijeoma Loor E Pre-anesthesia Checklist: Patient identified, Emergency Drugs available, Suction available and Patient being monitored Patient Re-evaluated:Patient Re-evaluated prior to inductionOxygen Delivery Method: Circle system utilized Preoxygenation: Pre-oxygenation with 100% oxygen Intubation Type: IV induction Ventilation: Mask ventilation without difficulty Grade View: Grade I Tube type: Oral Tube size: 7.5 mm Number of attempts: 1 Airway Equipment and Method: Stylet and Video-laryngoscopy Placement Confirmation: ETT inserted through vocal cords under direct vision,  positive ETCO2 and breath sounds checked- equal and bilateral Secured at: 21 cm Tube secured with: Tape Dental Injury: Teeth and Oropharynx as per pre-operative assessment

## 2013-11-04 NOTE — Anesthesia Preprocedure Evaluation (Addendum)
Anesthesia Evaluation  Patient identified by MRN, date of birth, ID band Patient awake    Reviewed: Allergy & Precautions, H&P , NPO status , Patient's Chart, lab work & pertinent test results  Airway Mallampati: II TM Distance: >3 FB Neck ROM: Full    Dental  (+) Dental Advisory Given, Teeth Intact   Pulmonary Current Smoker,  + rhonchi         Cardiovascular + pacemaker Rate:Normal     Neuro/Psych  Headaches, PSYCHIATRIC DISORDERS Anxiety    GI/Hepatic   Endo/Other    Renal/GU      Musculoskeletal   Abdominal   Peds  Hematology   Anesthesia Other Findings   Reproductive/Obstetrics                        Anesthesia Physical Anesthesia Plan  ASA: III  Anesthesia Plan: General   Post-op Pain Management:    Induction: Intravenous  Airway Management Planned: Oral ETT and Video Laryngoscope Planned  Additional Equipment:   Intra-op Plan:   Post-operative Plan: Extubation in OR  Informed Consent: I have reviewed the patients History and Physical, chart, labs and discussed the procedure including the risks, benefits and alternatives for the proposed anesthesia with the patient or authorized representative who has indicated his/her understanding and acceptance.   Dental advisory given  Plan Discussed with: CRNA and Surgeon  Anesthesia Plan Comments:      Anesthesia Quick Evaluation

## 2013-11-04 NOTE — Interval H&P Note (Signed)
History and Physical Interval Note:  11/04/2013 8:18 AM  Jean DoeQuionna M Rowe  has presented today for surgery, with the diagnosis of GOITER   The goals and the various methods of treatment have been discussed with the patient and family. After consideration of risks, benefits and other options for treatment, the patient has consented to  Procedure(s): THYROIDECTOMY (N/A), total,  as a surgical intervention .  The patient's history has been reviewed, patient examined today , no change in status, stable for surgery.  I have reviewed the patient's chart and labs.  Questions were answered to the patient's satisfaction.     Ernestene MentionINGRAM,[name]

## 2013-11-04 NOTE — Op Note (Signed)
Patient Name:           Jean Rowe   Date of Surgery:        11/04/2013  Pre op Diagnosis:      Benign nontoxic multinodular goiter with progressive pressure symptoms.  Post op Diagnosis:    Same  Procedure:                 Total thyroidectomy  Surgeon:                     Angelia MouldHaywood M. Derrell LollingIngram, M.D., FACS  Assistant:                      Harriette Bouillonhomas Cornett, M.D., FACS  Operative Indications:   Jean Rowe is a 28 y.o. female. She is referred by Dr. Lucianne MussKumar for evaluation and management of a symptomatic multinodular goiter. Dr. Burn[place]aura [place]CP. [facility].  The patient has noted thyroid symptoms for 5 years.. She has slowly developed enlargement of the thyroid gland, somewhat more on the right than the left. She has some swallowing problems with mild dysphagea and noisy swallowing, although there is no pain with swallowing. She does feel pressure in her neck when sleeps,  And she cannot sleep on her back because  she feels like she gets choked and either has to prop up on a pillow or lie on her side. She says her voice is variable sometimes normal,  sometimes a little raspy.  Thyroid function test on 06/23/2013 suggested hyperthyroidism with a TSH of 0.184. Dr. Lucianne MussKumar repeated this and the TSH, T4, and free T3 were completely normal suggesting that she was euthyroid and would not respond to radioiodine ablation. A CT scan of the neck showed enlarged multinodular thyroid goiter , right side greater than left, slight tracheal deviation to the left. No dominant lesions to suggest cancer. No adenopathy. No calcifications.  Examination in the office reveals a significant goiterer, soft, right greater than left, no adenopathy. She is brought to the operating room electively.     Operative Findings:       Both lobes of the thyroid gland were somewhat enlarged, the right being larger. There were bilateral nodules  which appeared benign. We felt that we identified  and preserved the  recurrent laryngeal nerves on both sides. We felt that we identified and preserved the inferior parathyroid glands on both sides. Most likely I identified and preserved the right superior parathyroid. I never saw the left superior parathyroid. There was no evidence of invasion, and grossly this appeared to be a benign nontoxic multinodular goiter.  Procedure in Detail:  Following the induction of general endotracheal anesthesia the patient was positioned with her arms at her sides and her neck extended. The neck and upper chest were prepped and draped in a sterile fashion. Intravenous antibiotics were given. Surgical time out was performed. 0.5% Marcaine with epinephrine was used as a local infiltration anesthetic.    A transverse collar incision was made about 2 cm above the sternal notch. Dissection was carried down through the platysmal muscle, and skin and platysma flaps were raised superiorly and inferiorly. A self-retaining retractor was placed. The strap muscles were divided in the midline and slowly dissected off of the right and left lobes of the thyroid gland.     I dissected the left side first. Inferior pole was mobilized and  a few small veins were controlled with the harmonic scalpel and small metal clips. The superior pole was then dissected and skeletonized. The superior pole vessels were controlled with a metal clip and harmonic scalpel. We stayed right in the capsule of the thyroid gland and we do not feel that we resected the left superior parathyroid gland but it was never seen. We then mobilized the left lobe from lateral to medial. Again, dissection was kept right in the capsule. Small vessels were controlled with the harmonic scalpel and occasionally small metal clips. The recurrent laryngeal nerve was seen and preserved. The inferior parathyroid gland was seen and preserved. The left lobe was dissected upward to the  anterior trachea. The pyramidal lobe was taken down. We then dissected the right lobe in similar fashion. It was larger. Inferior pole mobilized first. Superior pole was then mobilized and the superior pole vessels were taken down between metal clips and the harmonic scalpel and divided. I stayed in the capsule of the thyroid gland throughout the dissection mobilizing the gland from lateral to medial. The recurrent laryngeal nerve was identified and preserved. I then mobilized the gland up off the trachea.  I marked  the right superior pole with silk suture to orient the pathologist. The specimen was sent to pathology. Hemostasis was excellent and this achieved with small metal clips and some cautery. One small area of bleeding  right over the right  recurrent  nerve was controlled with pressure and was completely hemostatic at the end of the case. Snow hemostatic sponge was placed on both sides. Strap muscles were closed in the midline with interrupted 3-0 Vicryl sutures. Platysma muscle was closed with interrupted 3-0 Vicryl sutures. Skin was closed with a running subcuticular suture of 4-0 Monocryl and Dermabond. The patient tolerated the procedure well taken to recovery in stable condition. EBL 20 cc or less. Counts correct. Complications none.             Angelia Mould. Derrell Lolling, M.D., FACS General and Minimally Invasive Surgery Breast and Colorectal Surgery  11/04/2013 10:37 AM

## 2013-11-04 NOTE — Anesthesia Postprocedure Evaluation (Signed)
  Anesthesia Post-op Note  Patient: Jean Rowe  Procedure(s) Performed: Procedure(s): THYROIDECTOMY (N/A)  Patient Location: PACU  Anesthesia Type:General  Level of Consciousness: awake and alert   Airway and Oxygen Therapy: Patient Spontanous Breathing  Post-op Pain: mild  Post-op Assessment: Post-op Vital signs reviewed, Patient's Cardiovascular Status Stable, Respiratory Function Stable, Patent Airway, No signs of Nausea or vomiting and Pain level controlled  Post-op Vital Signs: Reviewed and stable  Complications: No apparent anesthesia complications

## 2013-11-04 NOTE — Transfer of Care (Signed)
Immediate Anesthesia Transfer of Care Note  Patient: Jean Rowe  Procedure(s) Performed: Procedure(s): THYROIDECTOMY (N/A)  Patient Location: PACU  Anesthesia Type:General  Level of Consciousness: awake and alert   Airway & Oxygen Therapy: Patient Spontanous Breathing and Patient connected to nasal cannula oxygen  Post-op Assessment: Report given to PACU RN, Post -op Vital signs reviewed and stable and Patient moving all extremities X 4  Post vital signs: Reviewed and stable  Complications: No apparent anesthesia complications

## 2013-11-05 ENCOUNTER — Other Ambulatory Visit (INDEPENDENT_AMBULATORY_CARE_PROVIDER_SITE_OTHER): Payer: Self-pay | Admitting: *Deleted

## 2013-11-05 ENCOUNTER — Other Ambulatory Visit (INDEPENDENT_AMBULATORY_CARE_PROVIDER_SITE_OTHER): Payer: Self-pay

## 2013-11-05 ENCOUNTER — Encounter (HOSPITAL_COMMUNITY): Payer: Self-pay | Admitting: General Surgery

## 2013-11-05 ENCOUNTER — Encounter (INDEPENDENT_AMBULATORY_CARE_PROVIDER_SITE_OTHER): Payer: Self-pay | Admitting: *Deleted

## 2013-11-05 ENCOUNTER — Telehealth (INDEPENDENT_AMBULATORY_CARE_PROVIDER_SITE_OTHER): Payer: Self-pay | Admitting: *Deleted

## 2013-11-05 DIAGNOSIS — E890 Postprocedural hypothyroidism: Secondary | ICD-10-CM

## 2013-11-05 DIAGNOSIS — G8918 Other acute postprocedural pain: Secondary | ICD-10-CM

## 2013-11-05 LAB — CALCIUM: Calcium: 8.9 mg/dL (ref 8.4–10.5)

## 2013-11-05 MED ORDER — OXYCODONE-ACETAMINOPHEN 7.5-325 MG PO TABS: 1.0000 | ORAL_TABLET | ORAL | Status: DC | PRN

## 2013-11-05 MED ORDER — LEVOTHYROXINE SODIUM 75 MCG PO TABS: 75.0000 ug | ORAL_TABLET | Freq: Every day | ORAL | Status: DC

## 2013-11-05 MED ORDER — CALCIUM CARBONATE-VITAMIN D 500-200 MG-UNIT PO TABS: 2.0000 | ORAL_TABLET | Freq: Two times a day (BID) | ORAL | Status: DC

## 2013-11-05 MED ORDER — HYDROCODONE-ACETAMINOPHEN 5-325 MG PO TABS: 1.0000 | ORAL_TABLET | ORAL | Status: DC | PRN

## 2013-11-05 NOTE — Telephone Encounter (Signed)
Called patient who is still in the hospital to make her aware that I have set her up a PO appt [facility] and for lab work.  I explained that I would mail her out a letter explaining the instructions of what she needs to do along with her appt time.  Patient states understanding and agreeable.

## 2013-11-05 NOTE — Discharge Summary (Signed)
Patient ID: [name] [id number] 28 y.o. October 11, 1985  Admit date: 11/04/2013  Discharge date and time: No discharge date for patient encounter.  Admitting Physician: Jean Rowe  Discharge Physician: Jean Rowe  Admission Diagnoses: GOITER   Discharge Diagnoses: Nontoxic, benign multinodular goiter  Operations: Procedure(s): THYROIDECTOMY  Admission Condition: good  Discharged Condition: good  Indication for Admission: Jean Rowe is a 28 y.o. female. She is referred by Jean Rowe for evaluation and management of a symptomatic multinodular goiter. Dr. Burnell Rowe in [place] is her PCP. She is followed at [facility] and vascular center for her pacemaker.  The patient has noted thyroid symptoms for 5 years.. She has slowly developed enlargement of the thyroid gland, somewhat more on the right than the left. She has some swallowing problems with mild dysphagea and noisy swallowing, although there is no pain with swallowing. She does feel pressure in her neck when sleeps, And she cannot sleep on her back because she feels like she gets choked and either has to prop up on a pillow or lie on her side. She says her voice is variable sometimes normal, sometimes a little raspy.  Thyroid function test on 06/23/2013 suggested hyperthyroidism with a TSH of 0.184. Jean Rowe repeated this and the TSH, T4, and free T3 were completely normal suggesting that she was euthyroid and would not respond to radioiodine ablation. A CT scan of the neck showed enlarged multinodular thyroid goiter , right side greater than left, slight tracheal deviation to the left. No dominant lesions to suggest cancer. No adenopathy. No calcifications.  Examination in the office reveals a significant goiterer, soft, right greater than left, no adenopathy. She is brought to the operating room electively.   Hospital Course: On the day of admission the patient was taken to the operating room and  underwent a total thyroidectomy. Operative findings were multinodular goiter and enlargement of both thyroid lobes, right greater than left. The surgery was uneventful.    She was observed overnight and had no problems. On the morning of postop day one she was awake and alert and comfortable. The wound looked good without any swelling or hematoma. Chvostek's sign was negative bilaterally.Calcium level is 8.9 on postop day 1. Voice is a little raspy but she actually phonates well and is not breathless and there's no respiratory difficulty. There have been no apparent problems with her pacemaker.       She is discharged on postop day 1. She was given a prescription for Synthroid 75 mcg per day, Os-Cal with D, 2 tablets at lunch and 2 tablets at supper, and Norco, 30 tablets.    My office will contact her to have outpatient lab work for calcium level in one week. I will see her back in the office in approximately 3 weeks. She knows that the surgical pathology report will be called to her either tomorrow or Monday. Diet and activities and return to work issues were discussed.  Consults: None  Significant Diagnostic Studies: Surgical pathology, pending  Treatments: surgery: Total thyroidectomy  Disposition: Home  Patient Instructions:    Medication List         ALPRAZolam 0.25 MG tablet  Commonly known as:  XANAX  Take 0.25 mg by mouth at bedtime as needed for sleep.     benzoyl peroxide 4 % lotion  Commonly known as:  BREVOXYL  Apply 1 application topically 2 (two) times daily.     calcium-vitamin D 500-200 MG-UNIT per tablet  Commonly  known as:  OSCAL WITH D  Take 2 tablets by mouth 2 (two) times daily before lunch and supper.     cyclobenzaprine 10 MG tablet  Commonly known as:  FLEXERIL  Take 10 mg by mouth 2 (two) times daily as needed for muscle spasms.     HYDROcodone-acetaminophen 5-325 MG per tablet  Commonly known as:  NORCO/VICODIN  Take 1 tablet by mouth every 8 (eight)  hours as needed for pain.     HYDROcodone-acetaminophen 5-325 MG per tablet  Commonly known as:  NORCO/VICODIN  Take 1-2 tablets by mouth every 4 (four) hours as needed.     levothyroxine 75 MCG tablet  Commonly known as:  SYNTHROID, LEVOTHROID  Take 1 tablet (75 mcg total) by mouth daily before breakfast.     meloxicam 15 MG tablet  Commonly known as:  MOBIC  Take 15 mg by mouth daily.     traZODone 50 MG tablet  Commonly known as:  DESYREL  Take 50 mg by mouth at bedtime.        Activity: activity as tolerated Diet: regular diet Wound Care: none needed  Follow-up:  With Dr. Derrell Rowe in 3 weeks.  Signed: Angelia MouldHaywood M. Derrell Rowe, M.D., FACS General and minimally invasive surgery Breast and Colorectal Surgery  11/05/2013, 6:19 AM

## 2013-11-05 NOTE — Progress Notes (Signed)
DC instructions reviewed with patient, questions answered, verbalized understanding.  Patient requested a prescription for Percocet instead of Vicodin, notified surgeon who stated we were to get rid of Vicodin script and have patient pick up Percocet script from his office.  Also verified with surgeon that patient could lie down flat when she got home.  RN discarded Vicodin prescription in shredder box and patient to pick up script for Percocet at surgeons office.  Patient ambulated to front of hospital accompanied by sisters to be taken home.  Patient in good condition at time of discharge from [facility].

## 2013-11-05 NOTE — Telephone Encounter (Signed)
Message copied by Consuelo PandyGALLIMORE, Aum Caggiano on Thu Nov 05, 2013  4:07 PM ------      Message from: Joanette GulaSMITHEY, CYNTHIA      Created: Thu Nov 05, 2013  4:03 PM                   ----- Message -----         From: Ernestene MentionHaywood M Ingram, MD         Sent: 11/05/2013   3:56 PM           To: Ivory BroadSara Glaspey, RN, Joanette Gulaynthia Smithey, LPN, #            Inform patient of Pathology report,.Tell her that there was no cancer found. This was good news.             hmi ------

## 2013-11-05 NOTE — Telephone Encounter (Signed)
Message copied by Consuelo PandyGALLIMORE, Kevonte Vanecek on Thu Nov 05, 2013  9:36 AM ------      Message from: Ernestene MentionINGRAM, HAYWOOD M      Created: Thu Nov 05, 2013  6:59 AM       Deanna and clinical folks,            Jean Rowe is being discharged today following total thyroidectomy. She is being placed on calcium tablets and Synthroid.            Please arrange for serum calcium level to be drawn approximately one week from today. Once we get that report I will advise her on a tapering her calcium intake.            Also arrange her calcium level and TSH to be drawn in about 2 weeks.            First postop visit in approximately 3 weeks.            hmi ------

## 2013-11-05 NOTE — Telephone Encounter (Signed)
Called patient and gave her the below message.  Patient states understanding.

## 2013-11-05 NOTE — Discharge Instructions (Signed)
-  see above 

## 2013-11-05 NOTE — Progress Notes (Signed)
Forwarded to Kristen

## 2013-11-05 NOTE — Progress Notes (Signed)
Quick Note:  Inform patient of Pathology report,.Tell her that there was no cancer found. This was good news.   hmi ______

## 2013-11-08 ENCOUNTER — Encounter (HOSPITAL_COMMUNITY): Payer: Self-pay | Admitting: Emergency Medicine

## 2013-11-08 ENCOUNTER — Emergency Department (HOSPITAL_COMMUNITY): Payer: Medicaid Other

## 2013-11-08 ENCOUNTER — Emergency Department (HOSPITAL_COMMUNITY)
Admission: EM | Admit: 2013-11-08 | Discharge: 2013-11-08 | Disposition: A | Discharge status: Home / Self Care | Payer: Medicaid Other | Attending: Emergency Medicine | Admitting: Emergency Medicine

## 2013-11-08 DIAGNOSIS — Z950 Presence of cardiac pacemaker: Secondary | ICD-10-CM | POA: Insufficient documentation

## 2013-11-08 DIAGNOSIS — F17200 Nicotine dependence, unspecified, uncomplicated: Secondary | ICD-10-CM | POA: Insufficient documentation

## 2013-11-08 DIAGNOSIS — Z87440 Personal history of urinary (tract) infections: Secondary | ICD-10-CM | POA: Insufficient documentation

## 2013-11-08 DIAGNOSIS — R011 Cardiac murmur, unspecified: Secondary | ICD-10-CM | POA: Insufficient documentation

## 2013-11-08 DIAGNOSIS — M542 Cervicalgia: Secondary | ICD-10-CM | POA: Insufficient documentation

## 2013-11-08 DIAGNOSIS — G8918 Other acute postprocedural pain: Secondary | ICD-10-CM | POA: Insufficient documentation

## 2013-11-08 DIAGNOSIS — F410 Panic disorder [episodic paroxysmal anxiety] without agoraphobia: Secondary | ICD-10-CM | POA: Insufficient documentation

## 2013-11-08 DIAGNOSIS — Z872 Personal history of diseases of the skin and subcutaneous tissue: Secondary | ICD-10-CM | POA: Insufficient documentation

## 2013-11-08 DIAGNOSIS — E039 Hypothyroidism, unspecified: Secondary | ICD-10-CM | POA: Insufficient documentation

## 2013-11-08 DIAGNOSIS — Z880 Allergy status to penicillin: Secondary | ICD-10-CM | POA: Insufficient documentation

## 2013-11-08 DIAGNOSIS — Z79899 Other long term (current) drug therapy: Secondary | ICD-10-CM | POA: Insufficient documentation

## 2013-11-08 DIAGNOSIS — Z91040 Latex allergy status: Secondary | ICD-10-CM | POA: Insufficient documentation

## 2013-11-08 DIAGNOSIS — K5900 Constipation, unspecified: Secondary | ICD-10-CM

## 2013-11-08 DIAGNOSIS — Z791 Long term (current) use of non-steroidal anti-inflammatories (NSAID): Secondary | ICD-10-CM | POA: Insufficient documentation

## 2013-11-08 LAB — COMPREHENSIVE METABOLIC PANEL WITH GFR
ALT: 53 U/L — ABNORMAL HIGH (ref 0–35)
AST: 63 U/L — ABNORMAL HIGH (ref 0–37)
Albumin: 3.4 g/dL — ABNORMAL LOW (ref 3.5–5.2)
Alkaline Phosphatase: 91 U/L (ref 39–117)
BUN: 6 mg/dL (ref 6–23)
CO2: 29 meq/L (ref 19–32)
Calcium: 8.8 mg/dL (ref 8.4–10.5)
Chloride: 103 meq/L (ref 96–112)
Creatinine, Ser: 0.81 mg/dL (ref 0.50–1.10)
GFR calc Af Amer: >90 mL/min
GFR calc non Af Amer: >90 mL/min
Glucose, Bld: 74 mg/dL (ref 70–99)
Potassium: 3.7 meq/L (ref 3.7–5.3)
Sodium: 142 meq/L (ref 137–147)
Total Bilirubin: <0.2 mg/dL — ABNORMAL LOW (ref 0.3–1.2)
Total Protein: 6.9 g/dL (ref 6.0–8.3)

## 2013-11-08 LAB — ACETAMINOPHEN LEVEL: Acetaminophen (Tylenol), Serum: <15 ug/mL (ref 10–30)

## 2013-11-08 LAB — CBC
HCT: 36.6 % (ref 36.0–46.0)
HEMOGLOBIN: 12.9 g/dL (ref 12.0–15.0)
MCH: 32.6 pg (ref 26.0–34.0)
MCHC: 35.2 g/dL (ref 30.0–36.0)
MCV: 92.4 fL (ref 78.0–100.0)
Platelets: 294 10*3/uL (ref 150–400)
RBC: 3.96 MIL/uL (ref 3.87–5.11)
RDW: 12.3 % (ref 11.5–15.5)
WBC: 7.6 10*3/uL (ref 4.0–10.5)

## 2013-11-08 MED ORDER — IOHEXOL 300 MG/ML  SOLN
75.0000 mL | Freq: Once | INTRAMUSCULAR | Status: AC | PRN
  Administered 2013-11-08: 75 mL via INTRAVENOUS

## 2013-11-08 NOTE — ED Notes (Signed)
Pt had thyroid removal surgery on Wednesday and shes been constipated since. shes also having severe pain unrelieved by her prescribed pain medication. She was given percocet to take q4-6 hours and she states shes had to take it more often because the pain is so severe

## 2013-11-08 NOTE — Discharge Instructions (Signed)
You need to take your pain medication as prescribed. You can permanently injury your liver if you continue to take the medication this way. Continue to take the miralax and if you are getting no relief you can take magnesium citrate form the pharmacy.be sure to drink plenty of fluids Constipation, Adult Constipation is when a person has fewer than 3 bowel movements a week; has difficulty having a bowel movement; or has stools that are dry, hard, or larger than normal. As people grow older, constipation is more common. If you try to fix constipation with medicines that make you have a bowel movement (laxatives), the problem may get worse. Long-term laxative use may cause the muscles of the colon to become weak. A low-fiber diet, not taking in enough fluids, and taking certain medicines may make constipation worse. CAUSES   Certain medicines, such as antidepressants, pain medicine, iron supplements, antacids, and water pills.   Certain diseases, such as diabetes, irritable bowel syndrome (IBS), thyroid disease, or depression.   Not drinking enough water.   Not eating enough fiber-rich foods.   Stress or travel.  Lack of physical activity or exercise.  Not going to the restroom when there is the urge to have a bowel movement.  Ignoring the urge to have a bowel movement.  Using laxatives too much. SYMPTOMS   Having fewer than 3 bowel movements a week.   Straining to have a bowel movement.   Having hard, dry, or larger than normal stools.   Feeling full or bloated.   Pain in the lower abdomen.  Not feeling relief after having a bowel movement. DIAGNOSIS  Your caregiver will take a medical history and perform a physical exam. Further testing may be done for severe constipation. Some tests may include:   A barium enema X-ray to examine your rectum, colon, and sometimes, your small intestine.  A sigmoidoscopy to examine your lower colon.  A colonoscopy to examine your entire  colon. TREATMENT  Treatment will depend on the severity of your constipation and what is causing it. Some dietary treatments include drinking more fluids and eating more fiber-rich foods. Lifestyle treatments may include regular exercise. If these diet and lifestyle recommendations do not help, your caregiver may recommend taking over-the-counter laxative medicines to help you have bowel movements. Prescription medicines may be prescribed if over-the-counter medicines do not work.  HOME CARE INSTRUCTIONS   Increase dietary fiber in your diet, such as fruits, vegetables, whole grains, and beans. Limit high-fat and processed sugars in your diet, such as Jamaica[place] fries, hamburgers, cookies, candies, and soda.   A fiber supplement may be added to your diet if you cannot get enough fiber from foods.   Drink enough fluids to keep your urine clear or pale yellow.   Exercise regularly or as directed by your caregiver.   Go to the restroom when you have the urge to go. Do not hold it.  Only take medicines as directed by your caregiver. Do not take other medicines for constipation without talking to your caregiver first. SEEK IMMEDIATE MEDICAL CARE IF:   You have bright red blood in your stool.   Your constipation lasts for more than 4 days or gets worse.   You have abdominal or rectal pain.   You have thin, pencil-like stools.  You have unexplained weight loss. MAKE SURE YOU:   Understand these instructions.  Will watch your condition.  Will get help right away if you are not doing well or get worse. Document  Released: 06/08/2004 Document Revised: 12/03/2011 Document Reviewed: 06/22/2013 [facility] Patient Information 2014 [place], [place].

## 2013-11-08 NOTE — ED Provider Notes (Signed)
Medical screening examination/treatment/procedure(s) were conducted as a shared visit with non-physician practitioner(s) and myself.  I personally evaluated the patient during the encounter.  EKG Interpretation   None         Shon Batonourtney F Nidia Grogan, MD 11/08/13 2238

## 2013-11-08 NOTE — ED Provider Notes (Signed)
Medical screening examination/treatment/procedure(s) were conducted as a shared visit with non-physician practitioner(s) and myself.  I personally evaluated the patient during the encounter.  EKG Interpretation   None       Patient presents with postoperative pain. Patient had thyroid well. She reports the constipation and pain over her incision site over the last day. She has not noted any drainage. She states the site appears somewhat more swollen. She is taking Percocet every 4-6 hours without relief. She denies any other symptoms including fever. She is nontoxic-appearing on exam. Incision site is clean dry and intact. Minimal swelling noted inferior to the wound without fluctuance or erythema.  CT neg.  Patient will be d/c'd and f/u with surgeon.  Shon Batonourtney F Horton, MD 11/08/13 2226

## 2013-11-08 NOTE — ED Provider Notes (Signed)
CSN: [id number]     Arrival date & time 11/08/13  1424 History   First MD Initiated Contact with Patient 11/08/13 1442     Chief Complaint  Patient presents with  . Post-op Problem     (Consider location/radiation/quality/duration/timing/severity/associated sxs/prior Treatment) HPI Comments: Pt states that she had thyroid removal 5 days ago. Pt states that the pain below the incision has increased since being here. Pt state that she is also having problems with constipation and pt state that that she is taking percocet every 2 hours:pt state that she has had 6 today:pt denies fever  The history is provided by the patient. No language interpreter was used.    Past Medical History  Diagnosis Date  . Thyroid disease   . Neurocardiogenic syncope   . 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 Anxiety     panic attacks  . History of kidney infection   . Acne   . Heart murmur   . Orthopnoea   . Hypothyroidism   . Daily headache    Past Surgical History  Procedure Laterality Date  . Thyroidectomy  11/04/2013  . Insert / replace / remove pacemaker  09/2004  . Dilation and curettage of uterus  2013  . Thyroidectomy N/A 11/04/2013    Procedure: THYROIDECTOMY;  Surgeon: Ernestene Mention, MD;  Location: [facility] OR;  Service: General;  Laterality: N/A;   Family History  Problem Relation Age of Onset  . Thyroid disease Mother   . Heart disease Mother   . Thyroid disease Maternal Uncle   . Thyroid disease Maternal Grandmother    History  Substance Use Topics  . Smoking status: Current Every Day Smoker -- .5 years    Types: Cigars  . Smokeless tobacco: Never Used     Comment: 11/04/2013 "smoke 3 black N milds/day"  . Alcohol Use: Yes     Comment: 11/04/2013 "mixed drinks or wine a few times/year"   OB History   Grav Para Term Preterm Abortions TAB SAB Ect Mult Living                 Review of Systems  Constitutional: Negative.   Respiratory: Negative.   Cardiovascular:  Negative.       Allergies  Banana; Penicillins; and Latex  Home Medications   Current Outpatient Rx  Name  Route  Sig  Dispense  Refill  . ALPRAZolam (XANAX) 0.25 MG tablet   Oral   Take 0.25 mg by mouth at bedtime as needed for sleep.         . benzoyl peroxide (BREVOXYL) 4 % lotion   Topical   Apply 1 application topically 2 (two) times daily.          . calcium-vitamin D (OSCAL WITH D) 500-200 MG-UNIT per tablet   Oral   Take 2 tablets by mouth 2 (two) times daily before lunch and supper.   50 tablet   2   . cyclobenzaprine (FLEXERIL) 10 MG tablet   Oral   Take 10 mg by mouth 2 (two) times daily as needed for muscle spasms.         Marland Kitchen levothyroxine (SYNTHROID, LEVOTHROID) 75 MCG tablet   Oral   Take 1 tablet (75 mcg total) by mouth daily before breakfast.   30 tablet   1   . meloxicam (MOBIC) 15 MG tablet   Oral   Take 15 mg by mouth daily.         Marland Kitchen  oxyCODONE-acetaminophen (PERCOCET) 7.5-325 MG per tablet   Oral   Take 1 tablet by mouth every 4 (four) hours as needed for pain.   30 tablet   0     Written by Dr Derrell LollingIngram and at front desk for pick up   . traZODone (DESYREL) 50 MG tablet   Oral   Take 50 mg by mouth at bedtime.          BP 141/94  Pulse 88  Temp(Src) 98.4 F (36.9 C) (Oral)  Resp 16  Wt 148 lb (67.132 kg)  SpO2 100% Physical Exam  Nursing note and vitals reviewed. Constitutional: She is oriented to person, place, and time. She appears well-developed and well-nourished.  Cardiovascular: Normal rate and regular rhythm.   Pulmonary/Chest: Effort normal and breath sounds normal.  Abdominal: Soft. Bowel sounds are normal. There is no tenderness.  Musculoskeletal: Normal range of motion.  Neurological: She is alert and oriented to person, place, and time.  Skin:  Small amount of swelling noted to the below the incision site:no fluctuance or redness noted    ED Course  Procedures (including critical care time) Labs  Review Labs Reviewed  COMPREHENSIVE METABOLIC PANEL - Abnormal; Notable for the following:    Albumin 3.4 (*)    AST 63 (*)    ALT 53 (*)    Total Bilirubin <0.2 (*)    All other components within normal limits  CBC  ACETAMINOPHEN LEVEL   Imaging Review Ct Soft Tissue Neck W Contrast  11/08/2013   CLINICAL DATA:  Patient underwent thyroid gland removal on 11/04/2013. Now with severe neck pain unrelieved by pain medication. Throat swelling.  EXAM: CT NECK WITH CONTRAST  TECHNIQUE: Multidetector CT imaging of the neck was performed using the standard protocol following the bolus administration of intravenous contrast.  CONTRAST:  75mL OMNIPAQUE IOHEXOL 300 MG/ML  SOLN  COMPARISON:  09/07/2013 [facility]  FINDINGS: There has been a total thyroidectomy for benign multinodular goiter. There is low attenuation fluid in the thyroid bed on the right and left consistent with benign postoperative change. Similar attenuation fluid can be seen superficial to the strap muscles. Small amount of air is present in the surgical bed, not unexpected.  There is no tracheal compression. The trachea has returned to midline location having previously been deviated slightly right to left. There is no laryngeal edema. There is no malposition of vocal cords to suggest recurrent laryngeal nerve palsy. There is no supraglottic edema or subglottic narrowing.  The internal carotid arteries are widely patent. The common carotid arteries are normally located and widely patent. Both internal jugular veins fill normally with contrast without evidence for thrombus or occlusion.  Lung apices are clear without effusion or pneumothorax. No mediastinal hematoma is evident. Pacemaker battery and leads obscure visualization of the upper mediastinum partially. 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 Visualized intracranial compartment unremarkable. No sinus pathology.  IMPRESSION:  Unremarkable appearance status post total thyroidectomy. There is fluid superficial to an within the surgical bed of low attenuation, not suggestive hematoma. There is no tracheal compression or displacement, and there is no evidence for vascular injury or vocal cord palsy.   Electronically Signed   By: Davonna BellingJohn  Curnes M.D.   On: 11/08/2013 16:59    EKG Interpretation   None       MDM   Final diagnoses:  Constipation  Post-op pain    Discussed otc medications for constipation. No infection  noted at the site. Instructed pt on importance of appropriate use of prescribed medications    Teressa Lower, NP 11/08/13 1714

## 2013-11-26 ENCOUNTER — Encounter (INDEPENDENT_AMBULATORY_CARE_PROVIDER_SITE_OTHER): Payer: Medicaid Other | Admitting: General Surgery

## 2013-11-30 ENCOUNTER — Encounter (INDEPENDENT_AMBULATORY_CARE_PROVIDER_SITE_OTHER): Payer: Medicaid Other | Admitting: General Surgery

## 2013-12-09 ENCOUNTER — Telehealth: Payer: Self-pay | Admitting: Endocrinology

## 2013-12-09 ENCOUNTER — Other Ambulatory Visit: Payer: Self-pay | Admitting: *Deleted

## 2013-12-09 NOTE — Telephone Encounter (Signed)
Pt would like her thyroid rx refilled she states she has an appt not until March 26 and only has 2 pills left   Dennie Bibleat says please call at either 11:15 or 1:15  Thank You :)

## 2013-12-09 NOTE — Telephone Encounter (Signed)
Patient needs an appt with Dr. Lucianne MussKumar before a refill can be given

## 2013-12-10 ENCOUNTER — Ambulatory Visit (INDEPENDENT_AMBULATORY_CARE_PROVIDER_SITE_OTHER): Payer: Medicaid Other | Admitting: Endocrinology

## 2013-12-10 ENCOUNTER — Encounter: Payer: Self-pay | Admitting: Endocrinology

## 2013-12-10 ENCOUNTER — Other Ambulatory Visit: Payer: Self-pay | Admitting: Endocrinology

## 2013-12-10 ENCOUNTER — Ambulatory Visit: Payer: Medicaid Other | Admitting: Endocrinology

## 2013-12-10 VITALS — BP 122/78 | HR 90 | Temp 97.7°F | Resp 14 | Ht 61.0 in | Wt 150.6 lb

## 2013-12-10 DIAGNOSIS — E890 Postprocedural hypothyroidism: Secondary | ICD-10-CM

## 2013-12-10 NOTE — Progress Notes (Signed)
Jean Rowe   Reason for Appointment: Followup of thyroid      History of Present Illness:   She had been seen in consultation for her goiter in 06/2013 At that time she was having symptomatic enlargement of her thyroid. She had difficulty with swallowing food and also even her own saliva. Apparently sometimes with force her food to go down as it is difficult to swallow. She was also choking on liquids at times. She had a pressure sensation in her neck at times and also difficulty sleeping flat on her back.  Because of significant or enlargement clinically and on CT scan she was referred for thyroidectomy. Her thyroid was more significantly enlarged on the right. Since her thyroidectomy she has not had any difficulty swallowing and also has no pressure sensation in her neck or choking Also did not have any hoarseness or numbness or tingling in her hands or face She has been started on 75 mcg of thyroxine supplement since her surgery on 11/04/13  However in the last month or so she has been feeling more tired and tends to take a nap in the evenings which is new. Also periodically will get hot flashes and feels alternately hot and cold. No changes in her skin, hair or weight  Wt Readings from Last 3 Encounters:  12/10/13 150 lb 9.6 oz (68.312 kg)  11/08/13 148 lb (67.132 kg)  11/05/13 149 lb 12.8 oz (67.949 kg)     Lab Results  Component Value Date   FREET4 0.82 09/01/2013   FREET4 1.19 06/23/2013   TSH 0.55 09/01/2013   TSH 0.184* 06/23/2013       Medication List       This list is accurate as of: 12/10/13  4:14 PM.  Always use your most recent med list.               ALPRAZolam 0.25 MG tablet  Commonly known as:  XANAX  Take 0.25 mg by mouth at bedtime as needed for sleep.     benzoyl peroxide 4 % lotion  Commonly known as:  BREVOXYL  Apply 1 application topically 2 (two) times daily.     calcium-vitamin D 500-200 MG-UNIT per tablet  Commonly known as:  OSCAL  WITH D  Take 2 tablets by mouth 2 (two) times daily before lunch and supper.     cyclobenzaprine 10 MG tablet  Commonly known as:  FLEXERIL  Take 10 mg by mouth daily as needed for muscle spasms.     HYDROcodone-acetaminophen 5-325 MG per tablet  Commonly known as:  NORCO/VICODIN  Take 1 tablet by mouth every 6 (six) hours as needed for moderate pain.     levothyroxine 75 MCG tablet  Commonly known as:  SYNTHROID, LEVOTHROID  Take 1 tablet (75 mcg total) by mouth daily before breakfast.     meloxicam 15 MG tablet  Commonly known as:  MOBIC  Take 15 mg by mouth daily.     traZODone 50 MG tablet  Commonly known as:  DESYREL  Take 50 mg by mouth at bedtime.        Allergies:  Allergies  Allergen Reactions  . Banana Anaphylaxis    Swelling of throat  . Penicillins   . Latex Rash    Past Medical History  Diagnosis Date  . Thyroid disease   . Neurocardiogenic syncope   . 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 Anxiety     panic attacks  .  History of kidney infection   . Acne   . Heart murmur   . Orthopnoea   . Hypothyroidism   . Daily headache     Past Surgical History  Procedure Laterality Date  . Thyroidectomy  11/04/2013  . Insert / replace / remove pacemaker  09/2004  . Dilation and curettage of uterus  2013  . Thyroidectomy N/A 11/04/2013    Procedure: THYROIDECTOMY;  Surgeon: Ernestene MentionHaywood M Ingram, MD;  [facility];  Service: General;  Laterality: N/A;    Family History  Problem Relation Age of Onset  . Thyroid disease Mother   . Heart disease Mother   . Thyroid disease Maternal Uncle   . Thyroid disease Maternal Grandmother    Most of her family members have had surgery for thyroid enlargement  Social History:  reports that she has been smoking Cigars.  She has never used smokeless tobacco. She reports that she drinks alcohol. She reports that she uses illicit drugs (Marijuana).   Review of Systems:  No history of hypertension      Examination:   BP 122/78  Pulse 90  Temp(Src) 97.7 F (36.5 C)  Resp 14  Ht 5\' 1"  (1.549 m)  Wt 150 lb 9.6 oz (68.312 kg)  BMI 28.47 kg/m2  SpO2 97%  Neck: She has a well-healed scar of thyroidectomy, no local tenderness or swelling. Biceps reflexes show a normal to brisk relaxation Skin is normal and hands are cool     Assessment/Plan:  Postsurgical hypothyroidism Currently she appears to have some symptoms of fatigue probably related to inadequate supplementation She weighs 150 pounds and most likely will need about 125 mcg for replacement  She will have a TSH level drawn today and her dose will be adjusted accordingly  Lexander Tremblay 06/24/2013, 11:25 AM

## 2013-12-11 ENCOUNTER — Other Ambulatory Visit: Payer: Self-pay | Admitting: *Deleted

## 2013-12-11 LAB — TSH: TSH: 16.076 u[IU]/mL — ABNORMAL HIGH (ref 0.350–4.500)

## 2013-12-11 MED ORDER — LEVOTHYROXINE SODIUM 112 MCG PO TABS: 112.0000 ug | ORAL_TABLET | Freq: Every day | ORAL | Status: DC

## 2013-12-17 ENCOUNTER — Encounter (INDEPENDENT_AMBULATORY_CARE_PROVIDER_SITE_OTHER): Payer: Medicaid Other | Admitting: General Surgery

## 2013-12-22 ENCOUNTER — Encounter (INDEPENDENT_AMBULATORY_CARE_PROVIDER_SITE_OTHER): Payer: Self-pay | Admitting: General Surgery

## 2014-02-10 ENCOUNTER — Other Ambulatory Visit: Payer: Self-pay | Admitting: *Deleted

## 2014-02-10 ENCOUNTER — Ambulatory Visit (INDEPENDENT_AMBULATORY_CARE_PROVIDER_SITE_OTHER): Payer: Medicaid Other | Admitting: Endocrinology

## 2014-02-10 ENCOUNTER — Encounter: Payer: Self-pay | Admitting: Endocrinology

## 2014-02-10 VITALS — BP 120/72 | HR 100 | Temp 97.7°F | Resp 14 | Ht 61.0 in | Wt 148.6 lb

## 2014-02-10 DIAGNOSIS — E890 Postprocedural hypothyroidism: Secondary | ICD-10-CM

## 2014-02-10 DIAGNOSIS — R252 Cramp and spasm: Secondary | ICD-10-CM | POA: Diagnosis not present

## 2014-02-10 NOTE — Progress Notes (Signed)
Jean Rowe   Reason for Appointment: Followup of postsurgical hypothyroidism      History of Present Illness:   She had been seen in initial consultation for her goiter in 06/2013 At that time she was having symptomatic enlargement of her thyroid. She had difficulty with swallowing food and also even her own saliva. Apparently sometimes with force her food to go down as it is difficult to swallow. She was also choking on liquids at times. She had a pressure sensation in her neck at times and also difficulty sleeping flat on her back.  Because of significant thyroid enlargement clinically especially on the right side and on CT scan she was referred for thyroidectomy.  Since her thyroidectomy she has not had any difficulty swallowing and also has no pressure sensation in her neck or choking She initially was on 75 mcg of thyroxine supplement after her surgery on 11/04/13  However she had symptoms of fatigue and had very TSH of 16 in 3/15 when she came back for followup Since increasing her dose to 112 mcg she has felt only slightly better and she is still complaining of feeling tired and tends to take a nap in the evenings  Has no cord intolerance or hair loss Her weight is stable.  She is also complaining of cramps in her hands and legs for the last month and has seen her primary care physician for this. She has been continued on calcium twice a day. Has not had any hypocalcemia after surgery and labs from PCP are not available  Wt Readings from Last 3 Encounters:  02/10/14 148 lb 9.6 oz (67.405 kg)  12/10/13 150 lb 9.6 oz (68.312 kg)  11/08/13 148 lb (67.132 kg)     Lab Results  Component Value Date   FREET4 0.82 09/01/2013   FREET4 1.19 06/23/2013   TSH 16.076* 12/10/2013   TSH 0.55 09/01/2013        Medication List       This list is accurate as of: 02/10/14  4:11 PM.  Always use your most recent med list.               ALPRAZolam 0.25 MG tablet  Commonly known  as:  XANAX  Take 0.25 mg by mouth at bedtime as needed for sleep.     benzoyl peroxide 4 % lotion  Commonly known as:  BREVOXYL  Apply 1 application topically 2 (two) times daily.     calcium-vitamin D 500-200 MG-UNIT per tablet  Commonly known as:  OSCAL WITH D  Take 2 tablets by mouth 2 (two) times daily before lunch and supper.     cyclobenzaprine 10 MG tablet  Commonly known as:  FLEXERIL  Take 10 mg by mouth daily as needed for muscle spasms.     HYDROcodone-acetaminophen 5-325 MG per tablet  Commonly known as:  NORCO/VICODIN  Take 1 tablet by mouth every 6 (six) hours as needed for moderate pain.     levothyroxine 112 MCG tablet  Commonly known as:  SYNTHROID, LEVOTHROID  Take 1 tablet (112 mcg total) by mouth daily.     meloxicam 15 MG tablet  Commonly known as:  MOBIC  Take 15 mg by mouth daily.     traZODone 50 MG tablet  Commonly known as:  DESYREL  Take 50 mg by mouth at bedtime.        Allergies:  Allergies  Allergen Reactions  . Banana Anaphylaxis    Swelling of throat  .  Penicillins   . Latex Rash    Past Medical History  Diagnosis Date  . Thyroid disease   . Neurocardiogenic syncope   . 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 Anxiety     panic attacks  . History of kidney infection   . Acne   . Heart murmur   . Orthopnoea   . Hypothyroidism   . Daily headache     Past Surgical History  Procedure Laterality Date  . Thyroidectomy  11/04/2013  . Insert / replace / remove pacemaker  09/2004  . Dilation and curettage of uterus  2013  . Thyroidectomy N/A 11/04/2013    Procedure: THYROIDECTOMY;  Surgeon: Ernestene MentionHaywood M Ingram, MD;  [facility];  Service: General;  Laterality: N/A;    Family History  Problem Relation Age of Onset  . Thyroid disease Mother   . Heart disease Mother   . Thyroid disease Maternal Uncle   . Thyroid disease Maternal Grandmother    Most of her family members have had surgery for thyroid  enlargement  Social History:  reports that she has been smoking Cigars.  She has never used smokeless tobacco. She reports that she drinks alcohol. She reports that she uses illicit drugs (Marijuana).   Review of Systems:  No history of hypertension     Examination:   BP 120/72  Pulse 100  Temp(Src) 97.7 F (36.5 C)  Resp 14  Ht 5\' 1"  (1.549 m)  Wt 148 lb 9.6 oz (67.405 kg)  BMI 28.09 kg/m2  SpO2 97%  Neck: She has a well-healed scar of thyroidectomy, no local tenderness or swelling. Biceps reflexes show a normal  relaxation No swelling of hands or face     Assessment/Plan:  Postsurgical hypothyroidism Again she has symptoms of fatigue despite increasing her dose significantly on the last visit She will have a TSH level drawn today and her dose will be adjusted accordingly  Muscle cramps: Will get reports of her electrolytes done by PCP and also check magnesium  Rameses Ou 06/24/2013, 11:25 AM   Addendum: TSH normal, to continue same dosage

## 2014-02-11 LAB — MAGNESIUM: Magnesium: 2 mg/dL (ref 1.5–2.5)

## 2014-02-11 LAB — TSH: TSH: 0.66 u[IU]/mL (ref 0.35–4.50)

## 2014-02-11 NOTE — Progress Notes (Signed)
Quick Note:  Please let patient know that the thyroid result is normal and no change needed ______ 

## 2014-02-24 ENCOUNTER — Ambulatory Visit (INDEPENDENT_AMBULATORY_CARE_PROVIDER_SITE_OTHER): Payer: Medicaid Other | Admitting: General Surgery

## 2014-02-24 ENCOUNTER — Encounter (INDEPENDENT_AMBULATORY_CARE_PROVIDER_SITE_OTHER): Payer: Self-pay | Admitting: General Surgery

## 2014-02-24 VITALS — BP 118/68 | HR 68 | Temp 98.4°F | Resp 14 | Ht 60.0 in | Wt 148.4 lb

## 2014-02-24 DIAGNOSIS — E890 Postprocedural hypothyroidism: Secondary | ICD-10-CM

## 2014-02-24 DIAGNOSIS — Z9889 Other specified postprocedural states: Secondary | ICD-10-CM

## 2014-02-24 NOTE — Progress Notes (Signed)
Patient ID: Jean Rowe, female   DOB: 1986-02-12, 28 y.o.   MRN: [id number] History: This patient underwent total thyroidectomy for benign multinodular goiter on 11/04/2013. Final pathology report showed benign multinodular goiter. She failed followup with me until today. She says she has seen Dr. Dwyane Dee and he has increased her Synthroid dose to 112 mcg per day.  she says that she saw her primary care physician [facility] she is she still taking 2 calcium tablets per day. There is no lab work in our system other than thyroid function testing She says her voice is slightly weak in the afternoon.  Exam: Patient looks well. No distress. Voice is reasonably strong, he just cannot phonate Jean Rowe. She is not breathless. Excellent Jean Rowe Is negative bilaterally Neck incision has healed normally  Assessment: Status post total thyroidectomy for benign multinodular goiter Mild voice weakness. Questions superior laryngeal nerve palsy. Surgically induced hypothyroidism, being followed by Dr. Dwyane Dee  Plan:  Plan refer to ENT for evaluation of hoarseness and vocal cords Continue calcium tablets as outlined by her PCP Obtain b-met now Follow with me in 2 months. She was encouraged to keep all her physician appointment. She'll see Dr. Dwyane Dee in 3 months    Edsel Petrin. Dalbert Batman, M.D., [facility] General and Minimally invasive Surgery Breast and Colorectal Surgery Office:   [phone] Pager:   [phone]

## 2014-02-24 NOTE — Patient Instructions (Signed)
Your thyroid surgery incision has healed normally.  Your voice is slightly weak, and so you will be referred to an ear nose and throat doctor to check your vocal cords  Continue to take the calcium tablets that were prescribed by your primary care doctor  Continue to take the Synthroid dose prescribed by Dr. Lucianne Muss  We will send you for blood work now  Return to see Dr. Derrell Lolling in 2 months

## 2014-02-25 ENCOUNTER — Telehealth (INDEPENDENT_AMBULATORY_CARE_PROVIDER_SITE_OTHER): Payer: Self-pay | Admitting: *Deleted

## 2014-02-25 NOTE — Telephone Encounter (Signed)
LM for pt to return my call.  Please advise pt of her appt at [facility] with Dr. Christia Reading, 03-03-14 at 1:30.  Their location is [address].   Their phone # is [phone].  Thanks!  Jennfier

## 2014-04-23 ENCOUNTER — Other Ambulatory Visit: Payer: Self-pay | Admitting: Endocrinology

## 2014-04-27 ENCOUNTER — Encounter (INDEPENDENT_AMBULATORY_CARE_PROVIDER_SITE_OTHER): Payer: Medicaid Other | Admitting: General Surgery

## 2014-04-27 ENCOUNTER — Telehealth (INDEPENDENT_AMBULATORY_CARE_PROVIDER_SITE_OTHER): Payer: Self-pay

## 2014-04-27 NOTE — Telephone Encounter (Signed)
V/M to call reschedule with Dr. Derrell LollingIngram , she is to continue with calcium tabs as ordered by her PCP.

## 2014-05-10 ENCOUNTER — Other Ambulatory Visit: Payer: Medicaid Other

## 2014-05-11 ENCOUNTER — Other Ambulatory Visit: Payer: Medicaid Other

## 2014-05-13 ENCOUNTER — Ambulatory Visit: Payer: Medicaid Other | Admitting: Endocrinology

## 2014-06-09 ENCOUNTER — Encounter (INDEPENDENT_AMBULATORY_CARE_PROVIDER_SITE_OTHER): Payer: Medicaid Other | Admitting: General Surgery

## 2014-06-10 ENCOUNTER — Encounter (INDEPENDENT_AMBULATORY_CARE_PROVIDER_SITE_OTHER): Payer: Self-pay | Admitting: General Surgery

## 2014-06-11 ENCOUNTER — Telehealth: Payer: Self-pay | Admitting: Endocrinology

## 2014-06-11 ENCOUNTER — Ambulatory Visit: Payer: Medicaid Other | Admitting: Endocrinology

## 2014-06-11 NOTE — Telephone Encounter (Signed)
Patient no showed today's appt. Please advise on how to follow up. °A. No follow up necessary. °B. Follow up urgent. Contact patient immediately. °C. Follow up necessary. Contact patient and schedule visit in ___ days. °D. Follow up advised. Contact patient and schedule visit in ____weeks. ° °

## 2014-06-15 NOTE — Telephone Encounter (Signed)
Letter was mailed .

## 2014-06-21 ENCOUNTER — Other Ambulatory Visit: Payer: Self-pay | Admitting: Endocrinology

## 2014-07-01 NOTE — Telephone Encounter (Signed)
L/m for pt to reschedule

## 2014-07-12 ENCOUNTER — Telehealth: Payer: Self-pay | Admitting: Cardiovascular Disease

## 2014-07-14 ENCOUNTER — Ambulatory Visit (INDEPENDENT_AMBULATORY_CARE_PROVIDER_SITE_OTHER): Payer: Medicaid Other | Admitting: Endocrinology

## 2014-07-14 ENCOUNTER — Encounter: Payer: Self-pay | Admitting: Endocrinology

## 2014-07-14 VITALS — BP 120/78 | HR 74 | Temp 97.6°F | Resp 14 | Ht 60.0 in | Wt 145.2 lb

## 2014-07-14 DIAGNOSIS — E890 Postprocedural hypothyroidism: Secondary | ICD-10-CM

## 2014-07-14 DIAGNOSIS — R252 Cramp and spasm: Secondary | ICD-10-CM

## 2014-07-14 NOTE — Telephone Encounter (Signed)
Closed encounter °

## 2014-07-14 NOTE — Progress Notes (Signed)
Jean Rowe   Reason for Appointment: Followup of postsurgical hypothyroidism      History of Present Illness:   She was initially seen in consultation for her goiter in 06/2013 At that time she was having symptomatic enlargement of her thyroid. She had difficulty with swallowing food and also even her own saliva. Apparently sometimes with force her food to go down as it is difficult to swallow. She was also choking on liquids at times. She had a pressure sensation in her neck at times and also difficulty sleeping flat on her back.  Because of significant thyroid enlargement clinically especially on the right side and on CT scan she was referred for thyroidectomy.  Since her thyroidectomy she initially did not have any difficulty swallowing and also  no pressure sensation in her neck or choking She initially was on 75 mcg of thyroxine supplement after her surgery on 11/04/13  However she had symptoms of fatigue and had very TSH of 16 in 3/15 when she came back for followup Since increasing her dose to 112 mcg she was subjectively better and her levels were improved Currently she does not feel tired except occasionally but thinks that she may be having some hair loss and cold intolerance  Her weight is stable.  She is not complaining of cramps in her hands and legs. She has been off calcium supplements, previously was recommended to take these by her surgeon   Wt Readings from Last 3 Encounters:  07/14/14 145 lb 3.2 oz (65.862 kg)  02/24/14 148 lb 6.4 oz (67.314 kg)  02/10/14 148 lb 9.6 oz (67.405 kg)     Lab Results  Component Value Date   FREET4 0.82 09/01/2013   FREET4 1.19 06/23/2013   TSH 0.66 02/10/2014   TSH 16.076* 12/10/2013        Medication List       This list is accurate as of: 07/14/14  1:57 PM.  Always use your most recent med list.               ALPRAZolam 0.25 MG tablet  Commonly known as:  XANAX  Take 0.25 mg by mouth at bedtime as needed for  sleep.     benzoyl peroxide 4 % lotion  Commonly known as:  BREVOXYL  Apply 1 application topically 2 (two) times daily.     calcium-vitamin D 500-200 MG-UNIT per tablet  Commonly known as:  OSCAL WITH D  Take 2 tablets by mouth 2 (two) times daily before lunch and supper.     cyclobenzaprine 10 MG tablet  Commonly known as:  FLEXERIL  Take 10 mg by mouth daily as needed for muscle spasms.     levothyroxine 112 MCG tablet  Commonly known as:  SYNTHROID, LEVOTHROID  TAKE ONE TABLET BY MOUTH ONCE DAILY.     meloxicam 15 MG tablet  Commonly known as:  MOBIC  Take 15 mg by mouth daily.     traZODone 50 MG tablet  Commonly known as:  DESYREL  Take 50 mg by mouth at bedtime.        Allergies:  Allergies  Allergen Reactions  . Banana Anaphylaxis    Swelling of throat  . Penicillins   . Latex Rash    Past Medical History  Diagnosis Date  . Thyroid disease   . Neurocardiogenic syncope   . 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 Anxiety     panic attacks  . History of kidney  infection   . Acne   . Heart murmur   . Orthopnoea   . Hypothyroidism   . Daily headache     Past Surgical History  Procedure Laterality Date  . Thyroidectomy  11/04/2013  . Insert / replace / remove pacemaker  09/2004  . Dilation and curettage of uterus  2013  . Thyroidectomy N/A 11/04/2013    Procedure: THYROIDECTOMY;  Surgeon: Ernestene MentionHaywood M Ingram, MD;  [facility];  Service: General;  Laterality: N/A;    Family History  Problem Relation Age of Onset  . Thyroid disease Mother   . Heart disease Mother   . Thyroid disease Maternal Uncle   . Thyroid disease Maternal Grandmother    Most of her family members have had surgery for thyroid enlargement  Social History:  reports that she has been smoking Cigars.  She has never used smokeless tobacco. She reports that she uses illicit drugs (Marijuana). She reports that she does not drink alcohol.   Review of Systems:  No history of  hypertension     Examination:   BP 120/78  Pulse 74  Temp(Src) 97.6 F (36.4 C)  Resp 14  Ht 5' (1.524 m)  Wt 145 lb 3.2 oz (65.862 kg)  BMI 28.36 kg/m2  SpO2 99%  Neck: She has a well-healed scar of thyroidectomy, no local mass felt Biceps reflexes show a normal  Relaxation, no tremor No swelling of hands or face;   skin appears normal  Assessment/Plan:  Postsurgical hypothyroidism She has occasional symptoms of fatigue and will check her TSH again Again discussed the necessity of taking the supplements lifelong and taking it every morning  History of muscle cramps: Will check calcium  Dysphagia: She apparently is scheduled to get evaluation for this  [facility],Jean Rowe 06/24/2013, 11:25 AM

## 2014-07-15 ENCOUNTER — Other Ambulatory Visit: Payer: Self-pay | Admitting: Endocrinology

## 2014-07-15 LAB — TSH: TSH: 0.8 u[IU]/mL (ref 0.35–4.50)

## 2014-07-15 LAB — BASIC METABOLIC PANEL
BUN: 9 mg/dL (ref 6–23)
CHLORIDE: 107 meq/L (ref 96–112)
CO2: 26 mEq/L (ref 19–32)
CREATININE: 0.8 mg/dL (ref 0.4–1.2)
Calcium: 8.7 mg/dL (ref 8.4–10.5)
GFR: 106.31 mL/min (ref >60.00)
Glucose, Bld: 72 mg/dL (ref 70–99)
POTASSIUM: 3.7 meq/L (ref 3.5–5.1)
Sodium: 138 mEq/L (ref 135–145)

## 2014-07-15 LAB — T4, FREE: FREE T4: 1.05 ng/dL (ref 0.60–1.60)

## 2014-07-16 ENCOUNTER — Telehealth: Payer: Self-pay | Admitting: Endocrinology

## 2014-07-16 NOTE — Progress Notes (Signed)
Quick Note:  Please let patient know that the lab result is normal and no further action needed ______ 

## 2014-07-16 NOTE — Telephone Encounter (Signed)
Patient states she needs a refill on her thyroid medication   Pharmacy: [facility] [address]   Thank you

## 2014-08-02 ENCOUNTER — Other Ambulatory Visit (INDEPENDENT_AMBULATORY_CARE_PROVIDER_SITE_OTHER): Payer: Self-pay

## 2014-08-02 DIAGNOSIS — R490 Dysphonia: Secondary | ICD-10-CM

## 2014-09-01 ENCOUNTER — Encounter: Payer: Self-pay | Admitting: *Deleted

## 2014-09-01 ENCOUNTER — Ambulatory Visit (INDEPENDENT_AMBULATORY_CARE_PROVIDER_SITE_OTHER): Payer: Self-pay | Admitting: Neurology

## 2014-09-01 ENCOUNTER — Ambulatory Visit (INDEPENDENT_AMBULATORY_CARE_PROVIDER_SITE_OTHER): Payer: Medicaid Other | Admitting: Neurology

## 2014-09-01 DIAGNOSIS — R202 Paresthesia of skin: Secondary | ICD-10-CM

## 2014-09-01 NOTE — Progress Notes (Signed)
Please refer to EMG note. 

## 2014-09-01 NOTE — Procedures (Signed)
     HISTORY:  Ardith DarkQuionna Brissett is a 28 year old patient with a history of numbness and tingling and cramps involving the hands, right greater than left, over the last one year. The neck pain or pain radiating down the arms. She is being evaluated for these symptoms.  NERVE CONDUCTION STUDIES:  Nerve conduction studies were performed on both upper extremities. The distal motor latencies and motor amplitudes for the median and ulnar nerves were within normal limits. The F wave latencies and nerve conduction velocities for these nerves were also normal. The sensory latencies for the median and ulnar nerves were normal.   EMG STUDIES:  EMG study was performed on the right upper extremity:  The first dorsal interosseous muscle reveals 2 to 4 K units with full recruitment. No fibrillations or positive waves were noted. The abductor pollicis brevis muscle reveals 2 to 4 K units with full recruitment. No fibrillations or positive waves were noted. The extensor indicis proprius muscle reveals 1 to 3 K units with full recruitment. No fibrillations or positive waves were noted. The pronator teres muscle reveals 2 to 3 K units with full recruitment. No fibrillations or positive waves were noted. The biceps muscle reveals 1 to 2 K units with full recruitment. No fibrillations or positive waves were noted. The triceps muscle reveals 2 to 4 K units with full recruitment. No fibrillations or positive waves were noted. The anterior deltoid muscle reveals 2 to 3 K units with full recruitment. No fibrillations or positive waves were noted. The cervical paraspinal muscles were tested at 2 levels. No abnormalities of insertional activity were seen at either level tested. There was good relaxation.  EMG study was performed on the left upper extremity:  The first dorsal interosseous muscle reveals 2 to 4 K units with full recruitment. No fibrillations or positive waves were noted. The abductor pollicis brevis  muscle reveals 2 to 4 K units with full recruitment. No fibrillations or positive waves were noted. The extensor indicis proprius muscle reveals 1 to 3 K units with full recruitment. No fibrillations or positive waves were noted. The pronator teres muscle reveals 2 to 3 K units with full recruitment. No fibrillations or positive waves were noted. The biceps muscle reveals 1 to 2 K units with full recruitment. No fibrillations or positive waves were noted. The triceps muscle reveals 2 to 4 K units with full recruitment. No fibrillations or positive waves were noted. The anterior deltoid muscle reveals 2 to 3 K units with full recruitment. No fibrillations or positive waves were noted. The cervical paraspinal muscles were tested at 2 levels. No abnormalities of insertional activity were seen at either level tested. There was fair relaxation.   IMPRESSION:  Nerve conduction studies done on both upper extremities were within normal limits. No evidence of carpal tunnel syndrome is seen on either side. EMG evaluation of both upper extremities were within normal limits, without evidence of an overlying cervical radiculopathy on either side.  Marlan Palau. Keith Alfreddie Consalvo MD 09/01/2014 5:08 PM  [facility] [address] [place], [place] [id number]  Phone [phone] Fax [phone]

## 2014-09-28 ENCOUNTER — Encounter: Payer: Medicaid Other | Admitting: Cardiovascular Disease

## 2014-10-17 ENCOUNTER — Inpatient Hospital Stay (HOSPITAL_COMMUNITY)
Admission: AD | Admit: 2014-10-17 | Discharge: 2014-10-17 | Disposition: A | Discharge status: Home / Self Care | Payer: Medicaid Other | Source: Ambulatory Visit | Attending: Obstetrics and Gynecology | Admitting: Obstetrics and Gynecology

## 2014-10-17 ENCOUNTER — Encounter (HOSPITAL_COMMUNITY): Payer: Self-pay

## 2014-10-17 DIAGNOSIS — F17210 Nicotine dependence, cigarettes, uncomplicated: Secondary | ICD-10-CM | POA: Insufficient documentation

## 2014-10-17 DIAGNOSIS — N898 Other specified noninflammatory disorders of vagina: Secondary | ICD-10-CM | POA: Insufficient documentation

## 2014-10-17 DIAGNOSIS — R300 Dysuria: Secondary | ICD-10-CM | POA: Diagnosis present

## 2014-10-17 DIAGNOSIS — Z950 Presence of cardiac pacemaker: Secondary | ICD-10-CM | POA: Diagnosis not present

## 2014-10-17 LAB — URINALYSIS, ROUTINE W REFLEX MICROSCOPIC
BILIRUBIN URINE: NEGATIVE
GLUCOSE, UA: NEGATIVE mg/dL
Hgb urine dipstick: NEGATIVE
Ketones, ur: NEGATIVE mg/dL
Nitrite: NEGATIVE
PROTEIN: 30 mg/dL — AB
SPECIFIC GRAVITY, URINE: 1.02 (ref 1.005–1.030)
UROBILINOGEN UA: 0.2 mg/dL (ref 0.0–1.0)
pH: 7.5 (ref 5.0–8.0)

## 2014-10-17 LAB — URINE MICROSCOPIC-ADD ON

## 2014-10-17 LAB — WET PREP, GENITAL
Clue Cells Wet Prep HPF POC: NONE SEEN
TRICH WET PREP: NONE SEEN
YEAST WET PREP: NONE SEEN

## 2014-10-17 NOTE — Discharge Instructions (Signed)

## 2014-10-17 NOTE — MAU Note (Signed)
Pt reports pain with urination x 4 days.

## 2014-10-17 NOTE — MAU Provider Note (Signed)
History     CSN: [id number]  Arrival date and time: 10/17/14 2006   First Provider Initiated Contact with Patient 10/17/14 2058      Chief Complaint  Patient presents with  . Dysuria   HPI  Jean Rowe is a 29 y.o. a G2P2 who presents today with pain with urination x 4 days. She states that she has been taking azo and drinking cranberry juice without much relief. She has also had vaginal discharge, but no odor.   Past Medical History  Diagnosis Date  . Thyroid disease   . Neurocardiogenic syncope   . 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 Anxiety     panic attacks  . History of kidney infection   . Acne   . Heart murmur   . Orthopnoea   . Hypothyroidism   . Daily headache     Past Surgical History  Procedure Laterality Date  . Thyroidectomy  11/04/2013  . Insert / replace / remove pacemaker  09/2004  . Dilation and curettage of uterus  2013  . Thyroidectomy N/A 11/04/2013    Procedure: THYROIDECTOMY;  Surgeon: Ernestene MentionHaywood M Ingram, MD;  [facility];  Service: General;  Laterality: N/A;    Family History  Problem Relation Age of Onset  . Thyroid disease Mother   . Heart disease Mother   . Thyroid disease Maternal Uncle   . Thyroid disease Maternal Grandmother     History  Substance Use Topics  . Smoking status: Current Every Day Smoker -- .5 years    Types: Cigars  . Smokeless tobacco: Never Used     Comment: 11/04/2013 "smoke 3 black N milds/day"  . Alcohol Use: No     Comment: 11/04/2013 "mixed drinks or wine a few times/year"    Allergies:  Allergies  Allergen Reactions  . Banana Anaphylaxis    Swelling of throat  . Penicillins Other (See Comments)    Unknown childhood rxn  . Latex Rash    Prescriptions prior to admission  Medication Sig Dispense Refill Last Dose  . ALPRAZolam (XANAX) 0.25 MG tablet Take 0.25 mg by mouth at bedtime as needed for sleep.   two weeks  . benzoyl peroxide (BREVOXYL) 4 % lotion Apply 1 application  topically 2 (two) times daily.    10/17/2014 at Unknown time  . cyclobenzaprine (FLEXERIL) 10 MG tablet Take 10 mg by mouth daily as needed for muscle spasms.    10/16/2014 at Unknown time  . levothyroxine (SYNTHROID, LEVOTHROID) 112 MCG tablet TAKE ONE TABLET BY MOUTH ONCE DAILY 30 tablet 5 10/16/2014 at Unknown time  . traZODone (DESYREL) 50 MG tablet Take 50 mg by mouth at bedtime.   Past Week at Unknown time  . calcium-vitamin D (OSCAL WITH D) 500-200 MG-UNIT per tablet Take 2 tablets by mouth 2 (two) times daily before lunch and supper. (Patient not taking: Reported on 10/17/2014) 50 tablet 2 Not Taking    ROS Physical Exam   Blood pressure 125/92, pulse 84, temperature 98.6 F (37 C), temperature source Oral, resp. rate 16, height 5' (1.524 m), weight 65.772 kg (145 lb), SpO2 100 %.  Physical Exam  MAU Course  Procedures  Results for orders placed or performed during the hospital encounter of 10/17/14 (from the past 24 hour(s))  Urinalysis, Routine w reflex microscopic     Status: Abnormal   Collection Time: 10/17/14  8:18 PM  Result Value Ref Range   Color, Urine YELLOW YELLOW  APPearance CLEAR CLEAR   Specific Gravity, Urine 1.020 1.005 - 1.030   pH 7.5 5.0 - 8.0   Glucose, UA NEGATIVE NEGATIVE mg/dL   Hgb urine dipstick NEGATIVE NEGATIVE   Bilirubin Urine NEGATIVE NEGATIVE   Ketones, ur NEGATIVE NEGATIVE mg/dL   Protein, ur 30 (A) NEGATIVE mg/dL   Urobilinogen, UA 0.2 0.0 - 1.0 mg/dL   Nitrite NEGATIVE NEGATIVE   Leukocytes, UA SMALL (A) NEGATIVE  Urine microscopic-add on     Status: Abnormal   Collection Time: 10/17/14  8:18 PM  Result Value Ref Range   Squamous Epithelial / LPF RARE RARE   WBC, UA 7-10 <3 WBC/hpf   RBC / HPF 3-6 <3 RBC/hpf   Bacteria, UA FEW (A) RARE  Wet prep, genital     Status: Abnormal   Collection Time: 10/17/14  9:06 PM  Result Value Ref Range   Yeast Wet Prep HPF POC NONE SEEN NONE SEEN   Trich, Wet Prep NONE SEEN NONE SEEN   Clue Cells  Wet Prep HPF POC NONE SEEN NONE SEEN   WBC, Wet Prep HPF POC FEW (A) NONE SEEN     Assessment and Plan   1. Dysuria    DC home UC, GC/CT, HIV pending Will call patient if UC is + Return to MAU as needed  Follow-up Information    Follow up with [facility] [place].   Why:  If symptoms worsen   Contact information:   [address] [facility] [address] [phone]       Tawnya Crook 10/17/2014, 8:59 PM

## 2014-10-18 ENCOUNTER — Telehealth (HOSPITAL_COMMUNITY): Payer: Self-pay | Admitting: *Deleted

## 2014-10-18 LAB — POCT PREGNANCY, URINE: Preg Test, Ur: NEGATIVE

## 2014-10-18 LAB — GC/CHLAMYDIA PROBE AMP (~~LOC~~) NOT AT ARMC
CHLAMYDIA, DNA PROBE: NEGATIVE
NEISSERIA GONORRHEA: NEGATIVE

## 2014-10-19 ENCOUNTER — Emergency Department (HOSPITAL_COMMUNITY)
Admission: EM | Admit: 2014-10-19 | Discharge: 2014-10-19 | Disposition: A | Discharge status: Home / Self Care | Payer: Medicaid Other | Source: Home / Self Care | Attending: Emergency Medicine | Admitting: Emergency Medicine

## 2014-10-19 ENCOUNTER — Encounter (HOSPITAL_COMMUNITY): Payer: Self-pay | Admitting: *Deleted

## 2014-10-19 DIAGNOSIS — N3000 Acute cystitis without hematuria: Secondary | ICD-10-CM

## 2014-10-19 DIAGNOSIS — N390 Urinary tract infection, site not specified: Secondary | ICD-10-CM | POA: Insufficient documentation

## 2014-10-19 LAB — POCT URINALYSIS DIP (DEVICE)
Glucose, UA: 250 mg/dL — AB
Ketones, ur: 15 mg/dL — AB
Nitrite: POSITIVE — AB
Protein, ur: >=300 mg/dL — AB
Specific Gravity, Urine: 1.015 (ref 1.005–1.030)
Urobilinogen, UA: >=8 mg/dL (ref 0.0–1.0)
pH: 8.5 — ABNORMAL HIGH (ref 5.0–8.0)

## 2014-10-19 LAB — URINALYSIS, ROUTINE W REFLEX MICROSCOPIC
Glucose, UA: NEGATIVE mg/dL
HGB URINE DIPSTICK: NEGATIVE
KETONES UR: 15 mg/dL — AB
Nitrite: POSITIVE — AB
PH: 7 (ref 5.0–8.0)
PROTEIN: 100 mg/dL — AB
SPECIFIC GRAVITY, URINE: 1.025 (ref 1.005–1.030)
Urobilinogen, UA: 4 mg/dL — ABNORMAL HIGH (ref 0.0–1.0)

## 2014-10-19 LAB — URINE CULTURE: Colony Count: 30000

## 2014-10-19 LAB — URINE MICROSCOPIC-ADD ON

## 2014-10-19 LAB — HIV ANTIBODY (ROUTINE TESTING W REFLEX)
HIV 1/O/2 Abs-Index Value: <1 (ref <1.00)
HIV-1/HIV-2 Ab: NONREACTIVE

## 2014-10-19 MED ORDER — SULFAMETHOXAZOLE-TRIMETHOPRIM 800-160 MG PO TABS: 1.0000 | ORAL_TABLET | Freq: Two times a day (BID) | ORAL | Status: DC

## 2014-10-19 MED ORDER — HYDROCODONE-ACETAMINOPHEN 5-325 MG PO TABS: ORAL_TABLET | ORAL | Status: DC

## 2014-10-19 NOTE — ED Notes (Signed)
C/o dysuria onset 1 week ago.  Taking AZO and drinking cranberry juice.  She went to the hospital Sunday, but did not give antibiotics.  Voiding frequently in small amounts.

## 2014-10-19 NOTE — Discharge Instructions (Signed)

## 2014-10-19 NOTE — Telephone Encounter (Signed)
Has Bactrim prescription already at home. Wants to know if culture was positive. Still having dysuria. Instructed to take Bactrim as prescribed. Use AZO when necessary.

## 2014-10-19 NOTE — ED Provider Notes (Signed)
Chief Complaint   Urinary Tract Infection   History of Present Illness   Jean Rowe is a 29 year old female who has had a one-week history of dysuria, frequency of small amounts, urgency, bladder pain and pressure, chills, and some vaginal discharge. She denies any fever, nausea, or vomiting. She's had multiple urinary tract infections in the past and has been hospitalized at least once for acute pyelonephritis. She was seen 3 days ago at [facility] hospital where she had a urine culture done. It's not growing out 30,000 colonies of Klebsiella which is sensitive to all antibiotics. She was not given any antibiotics. She did have a pelvic exam and pregnancy test. Her pregnancy test was negative. Her DNA probes came back negative for gonorrhea and Chlamydia.  Review of Systems   Other than as noted above, the patient denies any of the following symptoms: General:  No fevers or chills. GI:  No abdominal pain, back pain, nausea, or vomiting. GU:  No hematuria or incontinence. GYN:  No discharge, itching, vulvar pain or lesions, pelvic pain, or abnormal vaginal bleeding.  PMFSH   Past medical history, family history, social history, meds, and allergies were reviewed.  She is allergic to penicillin. She takes liver thyroxine, cyclobenzaprine, and trazodone.  Physical Examination     Vital signs:  BP 112/76 mmHg  Pulse 94  Temp(Src) 98.5 F (36.9 C) (Oral)  Resp 16  SpO2 98% Gen:  Alert, oriented, in no distress. Lungs:  Clear to auscultation, no wheezes, rales or rhonchi. Heart:  Regular rhythm, no gallop or murmer. Abdomen:  Flat and soft. There was slight suprapubic pain to palpation.  No guarding, or rebound.  No hepato-splenomegaly or mass.  Bowel sounds were normally active.  No hernia. Back:  No CVA tenderness.  Skin:  Clear, warm and dry.  Labs   Results for orders placed or performed during the hospital encounter of 10/19/14  POCT urinalysis dip (device)  Result  Value Ref Range   Glucose, UA 250 (A) NEGATIVE mg/dL   Bilirubin Urine MODERATE (A) NEGATIVE   Ketones, ur 15 (A) NEGATIVE mg/dL   Specific Gravity, Urine 1.015 1.005 - 1.030   Hgb urine dipstick TRACE (A) NEGATIVE   pH 8.5 (H) 5.0 - 8.0   Protein, ur >=300 (A) NEGATIVE mg/dL   Urobilinogen, UA >=4.0 0.0 - 1.0 mg/dL   Nitrite POSITIVE (A) NEGATIVE   Leukocytes, UA LARGE (A) NEGATIVE     A urine culture was not obtained, since we already have results of the one done on Sunday.  Assessment   The encounter diagnosis was Acute cystitis without hematuria.   No evidence of pyelonephritis.    Plan   1.  Meds:  The following meds were prescribed:   Discharge Medication List as of 10/19/2014  6:52 PM    START taking these medications   Details  HYDROcodone-acetaminophen (NORCO/VICODIN) 5-325 MG per tablet 1 to 2 tabs every 4 to 6 hours as needed for pain., Print    sulfamethoxazole-trimethoprim (BACTRIM DS,SEPTRA DS) 800-160 MG per tablet Take 1 tablet by mouth 2 (two) times daily., Starting 10/19/2014, Until Discontinued, Normal        2.  Patient Education/Counseling:  The patient was given appropriate handouts, self care instructions, and instructed in symptomatic relief. The patient was told to avoid intercourse for 10 days, get extra fluids, and return for a follow up with her primary care doctor at the completion of treatment for a repeat UA and culture.  3.  Follow up:  The patient was told to follow up here if no better in 3 to 4 days, or sooner if becoming worse in any way, and given some red flag symptoms such as fever, persistent vomiting, or severe flank or abdominal pain which would prompt immediate return.     Reuben Likesavid C Yancy Knoble, MD 10/19/14 2107

## 2014-10-26 ENCOUNTER — Other Ambulatory Visit (HOSPITAL_COMMUNITY)
Admission: RE | Admit: 2014-10-26 | Discharge: 2014-10-26 | Disposition: A | Discharge status: Home / Self Care | Payer: Medicaid Other | Source: Ambulatory Visit | Attending: Family Medicine | Admitting: Family Medicine

## 2014-10-26 ENCOUNTER — Encounter (HOSPITAL_COMMUNITY): Payer: Self-pay | Admitting: Emergency Medicine

## 2014-10-26 ENCOUNTER — Emergency Department (INDEPENDENT_AMBULATORY_CARE_PROVIDER_SITE_OTHER)
Admission: EM | Admit: 2014-10-26 | Discharge: 2014-10-26 | Disposition: A | Discharge status: Home / Self Care | Payer: Medicaid Other | Source: Home / Self Care | Attending: Family Medicine | Admitting: Family Medicine

## 2014-10-26 DIAGNOSIS — N3001 Acute cystitis with hematuria: Secondary | ICD-10-CM

## 2014-10-26 DIAGNOSIS — N760 Acute vaginitis: Secondary | ICD-10-CM | POA: Insufficient documentation

## 2014-10-26 DIAGNOSIS — Z113 Encounter for screening for infections with a predominantly sexual mode of transmission: Secondary | ICD-10-CM | POA: Diagnosis not present

## 2014-10-26 LAB — POCT URINALYSIS DIP (DEVICE)
Glucose, UA: 250 mg/dL — AB
Ketones, ur: 15 mg/dL — AB
Nitrite: POSITIVE — AB
Protein, ur: 100 mg/dL — AB
Specific Gravity, Urine: >=1.03 (ref 1.005–1.030)
Urobilinogen, UA: 2 mg/dL — ABNORMAL HIGH (ref 0.0–1.0)
pH: 6 (ref 5.0–8.0)

## 2014-10-26 LAB — POCT PREGNANCY, URINE: Preg Test, Ur: NEGATIVE

## 2014-10-26 MED ORDER — CIPROFLOXACIN HCL 500 MG PO TABS: 500.0000 mg | ORAL_TABLET | Freq: Two times a day (BID) | ORAL | Status: DC

## 2014-10-26 NOTE — Discharge Instructions (Signed)
Thank you for coming in today. Take Cipro twice daily for one week I will call if anything comes back positive  If your belly pain worsens, or you have high fever, bad vomiting, blood in your stool or black tarry stool go to the Emergency Room.    Urinary Tract Infection Urinary tract infections (UTIs) can develop anywhere along your urinary tract. Your urinary tract is your body's drainage system for removing wastes and extra water. Your urinary tract includes two kidneys, two ureters, a bladder, and a urethra. Your kidneys are a pair of bean-shaped organs. Each kidney is about the size of your fist. They are located below your ribs, one on each side of your spine. CAUSES Infections are caused by microbes, which are microscopic organisms, including fungi, viruses, and bacteria. These organisms are so small that they can only be seen through a microscope. Bacteria are the microbes that most commonly cause UTIs. SYMPTOMS  Symptoms of UTIs may vary by age and gender of the patient and by the location of the infection. Symptoms in young women typically include a frequent and intense urge to urinate and a painful, burning feeling in the bladder or urethra during urination. Older women and men are more likely to be tired, shaky, and weak and have muscle aches and abdominal pain. A fever may mean the infection is in your kidneys. Other symptoms of a kidney infection include pain in your back or sides below the ribs, nausea, and vomiting. DIAGNOSIS To diagnose a UTI, your caregiver will ask you about your symptoms. Your caregiver also will ask to provide a urine sample. The urine sample will be tested for bacteria and white blood cells. White blood cells are made by your body to help fight infection. TREATMENT  Typically, UTIs can be treated with medication. Because most UTIs are caused by a bacterial infection, they usually can be treated with the use of antibiotics. The choice of antibiotic and length of  treatment depend on your symptoms and the type of bacteria causing your infection. HOME CARE INSTRUCTIONS  If you were prescribed antibiotics, take them exactly as your caregiver instructs you. Finish the medication even if you feel better after you have only taken some of the medication.  Drink enough water and fluids to keep your urine clear or pale yellow.  Avoid caffeine, tea, and carbonated beverages. They tend to irritate your bladder.  Empty your bladder often. Avoid holding urine for long periods of time.  Empty your bladder before and after sexual intercourse.  After a bowel movement, women should cleanse from front to back. Use each tissue only once. SEEK MEDICAL CARE IF:   You have back pain.  You develop a fever.  Your symptoms do not begin to resolve within 3 days. SEEK IMMEDIATE MEDICAL CARE IF:   You have severe back pain or lower abdominal pain.  You develop chills.  You have nausea or vomiting.  You have continued burning or discomfort with urination. MAKE SURE YOU:   Understand these instructions.  Will watch your condition.  Will get help right away if you are not doing well or get worse. Document Released: 06/20/2005 Document Revised: 03/11/2012 Document Reviewed: 10/19/2011 [facility] Patient Information 2015 [employer] [place] [place]. This information is not intended to replace advice given to you by your health care provider. Make sure you discuss any questions you have with your health care provider.

## 2014-10-26 NOTE — ED Provider Notes (Signed)
Jean Rowe is a 29 y.o. female who presents to Urgent Care today for Urinary frequency urgency and dysuria present for several weeks. Patient was initially seen at [facility] on January 24. A pelvic exam and urine culture were obtained. Urine culture grew Klebsiella sensitive to Bactrim and Cipro. She was seen at this location on the 26th where she was started on Bactrim and response to the positive urine culture. Since then her symptoms have not improved. She has also developed new onset of vaginal discharge. She has tried taking AZO over-the-counter which helps. She has continued her course of Bactrim. No fevers or chills.   Past Medical History  Diagnosis Date  . Thyroid disease   . Neurocardiogenic syncope   . 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 Anxiety     panic attacks  . History of kidney infection   . Acne   . Heart murmur   . Orthopnoea   . Hypothyroidism   . Daily headache    Past Surgical History  Procedure Laterality Date  . Thyroidectomy  11/04/2013  . Insert / replace / remove pacemaker  09/2004  . Dilation and curettage of uterus  2013  . Thyroidectomy N/A 11/04/2013    Procedure: THYROIDECTOMY;  Surgeon: Ernestene MentionHaywood M Ingram, MD;  [facility];  Service: General;  Laterality: N/A;   History  Substance Use Topics  . Smoking status: Current Every Day Smoker -- .5 years    Types: Cigars  . Smokeless tobacco: Never Used     Comment: 11/04/2013 "smoke 3 black N milds/day"  . Alcohol Use: Yes     Comment: 11/04/2013 "mixed drinks or wine once a month   ROS as above Medications: No current facility-administered medications for this encounter.   Current Outpatient Prescriptions  Medication Sig Dispense Refill  . levothyroxine (SYNTHROID, LEVOTHROID) 112 MCG tablet TAKE ONE TABLET BY MOUTH ONCE DAILY 30 tablet 5  . sulfamethoxazole-trimethoprim (BACTRIM DS,SEPTRA DS) 800-160 MG per tablet Take 1 tablet by mouth 2 (two) times daily. 20 tablet 0   . ALPRAZolam (XANAX) 0.25 MG tablet Take 0.25 mg by mouth at bedtime as needed for sleep.    . benzoyl peroxide (BREVOXYL) 4 % lotion Apply 1 application topically 2 (two) times daily.     . calcium-vitamin D (OSCAL WITH D) 500-200 MG-UNIT per tablet Take 2 tablets by mouth 2 (two) times daily before lunch and supper. (Patient not taking: Reported on 10/17/2014) 50 tablet 2  . ciprofloxacin (CIPRO) 500 MG tablet Take 1 tablet (500 mg total) by mouth 2 (two) times daily. 14 tablet 0  . cyclobenzaprine (FLEXERIL) 10 MG tablet Take 10 mg by mouth daily as needed for muscle spasms.     Marland Kitchen. HYDROcodone-acetaminophen (NORCO/VICODIN) 5-325 MG per tablet 1 to 2 tabs every 4 to 6 hours as needed for pain. 20 tablet 0  . traZODone (DESYREL) 50 MG tablet Take 50 mg by mouth at bedtime.     Allergies  Allergen Reactions  . Banana Anaphylaxis    Swelling of throat  . Penicillins Other (See Comments)    Unknown childhood rxn  . Latex Rash     Exam:  BP 110/79 mmHg  Pulse 95  Temp(Src) 98 F (36.7 C) (Oral)  Resp 12  SpO2 100% Gen: Well NAD HEENT: EOMI,  MMM Lungs: Normal work of breathing. CTABL Heart: RRR no MRG Abd: NABS, Soft. Nondistended, Nontender no CV angle tenderness to percussion Exts: Brisk capillary refill,  warm and well perfused.  GYN: Normal external genitalia. Vaginal canal with thin white discharge. Cervix is normal-appearing. Normal IUD strings visible. No adnexal tenderness or masses palpated. No cervical motion tenderness.  Results for orders placed or performed during the hospital encounter of 10/26/14 (from the past 24 hour(s))  POCT urinalysis dip (device)     Status: Abnormal   Collection Time: 10/26/14  4:23 PM  Result Value Ref Range   Glucose, UA 250 (A) NEGATIVE mg/dL   Bilirubin Urine SMALL (A) NEGATIVE   Ketones, ur 15 (A) NEGATIVE mg/dL   Specific Gravity, Urine >=1.030 1.005 - 1.030   Hgb urine dipstick SMALL (A) NEGATIVE   pH 6.0 5.0 - 8.0   Protein, ur 100  (A) NEGATIVE mg/dL   Urobilinogen, UA 2.0 (H) 0.0 - 1.0 mg/dL   Nitrite POSITIVE (A) NEGATIVE   Leukocytes, UA TRACE (A) NEGATIVE  Pregnancy, urine POC     Status: None   Collection Time: 10/26/14  4:43 PM  Result Value Ref Range   Preg Test, Ur NEGATIVE NEGATIVE   No results found.  Assessment and Plan: 29 y.o. female with probable UTI. Culture pending. Treat with Cipro. Vaginal cytology also pending. Will call patient with results. Recommend follow-up with OB/GYN.  Discussed warning signs or symptoms. Please see discharge instructions. Patient expresses understanding.     Rodolph Bong, MD 10/26/14 [phone]

## 2014-10-26 NOTE — ED Notes (Signed)
Pt reports not feeling any better and is starting to have bladder spasms.  Denies any other symptoms.

## 2014-10-27 LAB — CERVICOVAGINAL ANCILLARY ONLY
Chlamydia: NEGATIVE
Neisseria Gonorrhea: NEGATIVE
WET PREP (BD AFFIRM): NEGATIVE
Wet Prep (BD Affirm): NEGATIVE
Wet Prep (BD Affirm): NEGATIVE

## 2014-10-27 LAB — URINE CULTURE
CULTURE: NO GROWTH
Colony Count: NO GROWTH
SPECIAL REQUESTS: NORMAL

## 2014-11-11 ENCOUNTER — Encounter: Payer: Self-pay | Admitting: *Deleted

## 2015-01-10 ENCOUNTER — Other Ambulatory Visit: Payer: Medicaid Other

## 2015-01-13 ENCOUNTER — Ambulatory Visit: Payer: Medicaid Other | Admitting: Endocrinology

## 2015-01-20 ENCOUNTER — Other Ambulatory Visit: Payer: Self-pay | Admitting: Endocrinology

## 2015-01-26 ENCOUNTER — Encounter: Payer: Self-pay | Admitting: *Deleted

## 2015-01-31 ENCOUNTER — Emergency Department (HOSPITAL_COMMUNITY)
Admission: EM | Admit: 2015-01-31 | Discharge: 2015-01-31 | Disposition: A | Discharge status: Home / Self Care | Payer: Medicaid Other | Attending: Emergency Medicine | Admitting: Emergency Medicine

## 2015-01-31 DIAGNOSIS — Z79899 Other long term (current) drug therapy: Secondary | ICD-10-CM | POA: Insufficient documentation

## 2015-01-31 DIAGNOSIS — F419 Anxiety disorder, unspecified: Secondary | ICD-10-CM | POA: Insufficient documentation

## 2015-01-31 DIAGNOSIS — K649 Unspecified hemorrhoids: Secondary | ICD-10-CM

## 2015-01-31 DIAGNOSIS — Z950 Presence of cardiac pacemaker: Secondary | ICD-10-CM | POA: Insufficient documentation

## 2015-01-31 DIAGNOSIS — Z91040 Latex allergy status: Secondary | ICD-10-CM | POA: Insufficient documentation

## 2015-01-31 DIAGNOSIS — R011 Cardiac murmur, unspecified: Secondary | ICD-10-CM | POA: Insufficient documentation

## 2015-01-31 DIAGNOSIS — Z872 Personal history of diseases of the skin and subcutaneous tissue: Secondary | ICD-10-CM | POA: Insufficient documentation

## 2015-01-31 DIAGNOSIS — Z720 Tobacco use: Secondary | ICD-10-CM | POA: Insufficient documentation

## 2015-01-31 DIAGNOSIS — E039 Hypothyroidism, unspecified: Secondary | ICD-10-CM | POA: Insufficient documentation

## 2015-01-31 DIAGNOSIS — F410 Panic disorder [episodic paroxysmal anxiety] without agoraphobia: Secondary | ICD-10-CM | POA: Insufficient documentation

## 2015-01-31 DIAGNOSIS — Z880 Allergy status to penicillin: Secondary | ICD-10-CM | POA: Insufficient documentation

## 2015-01-31 MED ORDER — DOCUSATE SODIUM 100 MG PO CAPS: 100.0000 mg | ORAL_CAPSULE | Freq: Two times a day (BID) | ORAL | Status: DC

## 2015-01-31 MED ORDER — HYDROCORTISONE 2.5 % RE CREA: TOPICAL_CREAM | RECTAL | Status: DC

## 2015-01-31 NOTE — ED Notes (Signed)
Pt states has a "hemorhoid that is hanging out" -- pain with walking, moving, no rectal bleeding,

## 2015-01-31 NOTE — Discharge Instructions (Signed)

## 2015-01-31 NOTE — ED Provider Notes (Signed)
CSN: [id number]     Arrival date & time 01/31/15  [id number] History   First MD Initiated Contact with Patient 01/31/15 [phone]     Chief Complaint  Patient presents with  . Hemorrhoids     (Consider location/radiation/quality/duration/timing/severity/associated sxs/prior Treatment) HPI Comments: 29 year old female, history of pregnancy related hemorrhoids, they resolved spontaneously in the past, states that over the last several days she has had increased pain with a hemorrhoid, states that it hurts to sit, she has had diarrhea and has been wiping frequently and has had occasional blood on the paper. She denies abdominal discomfort, nausea vomiting fevers chills chest pain shortness of breath or any other complaints. She has taken no medications prior to arrival, has no history of hemorrhoidal surgery.  The history is provided by the patient.    Past Medical History  Diagnosis Date  . Thyroid disease   . Neurocardiogenic syncope   . 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 Anxiety     panic attacks  . History of kidney infection   . Acne   . Heart murmur   . Orthopnoea   . Hypothyroidism   . Daily headache   . Neurocardiogenic syncope   . Panic attacks   . Symptomatic bradycardia    Past Surgical History  Procedure Laterality Date  . Thyroidectomy  11/04/2013  . Insert / replace / remove pacemaker  09/2004  . Dilation and curettage of uterus  2013  . Thyroidectomy N/A 11/04/2013    Procedure: THYROIDECTOMY;  Surgeon: Ernestene MentionHaywood M Ingram, MD;  [facility];  Service: General;  Laterality: N/A;   Family History  Problem Relation Age of Onset  . Thyroid disease Mother   . Heart disease Mother   . Thyroid disease Maternal Uncle   . Thyroid disease Maternal Grandmother   . Heart failure Maternal Grandmother   . Hypertension Maternal Grandmother   . Hypertension Maternal Grandfather    History  Substance Use Topics  . Smoking status: Current Every Day Smoker -- .5 years   Types: Cigars  . Smokeless tobacco: Never Used     Comment: 11/04/2013 "smoke 3 black N milds/day"  . Alcohol Use: Yes     Comment: 11/04/2013 "mixed drinks or wine once a month   OB History    Gravida Para Term Preterm AB TAB SAB Ectopic Multiple Living   2 2        2      Review of Systems  All other systems reviewed and are negative.     Allergies  Banana; Penicillins; and Latex  Home Medications   Prior to Admission medications   Medication Sig Start Date End Date Taking? Authorizing Provider  ALPRAZolam Prudy Feeler(XANAX) 0.25 MG tablet Take 0.25 mg by mouth at bedtime as needed for sleep.   Yes Historical Provider, MD  cyclobenzaprine (FLEXERIL) 10 MG tablet Take 10 mg by mouth daily as needed for muscle spasms.    Yes Historical Provider, MD  levothyroxine (SYNTHROID, LEVOTHROID) 112 MCG tablet TAKE ONE TABLET BY MOUTH ONCE DAILY 01/21/15  Yes Reather LittlerAjay Kumar, MD  traZODone (DESYREL) 50 MG tablet Take 50 mg by mouth at bedtime.   Yes Historical Provider, MD  benzoyl peroxide (BREVOXYL) 4 % lotion Apply 1 application topically 2 (two) times daily.     Historical Provider, MD  docusate sodium (COLACE) 100 MG capsule Take 1 capsule (100 mg total) by mouth every 12 (twelve) hours. 01/31/15   Eber HongBrian Janeshia Ciliberto, MD  hydrocortisone (ANUSOL-HC)  2.5 % rectal cream Apply rectally 2 times daily 01/31/15   Eber HongBrian Deakin Lacek, MD   BP 144/101 mmHg  Pulse 100  Temp(Src) 98.1 F (36.7 C) (Oral)  Resp 16  SpO2 100% Physical Exam  Constitutional: She appears well-developed and well-nourished.  HENT:  Head: Normocephalic and atraumatic.  Eyes: Conjunctivae are normal. Right eye exhibits no discharge. Left eye exhibits no discharge.  Pulmonary/Chest: Effort normal. No respiratory distress.  Abdominal: Soft. There is no tenderness.  Genitourinary:  Chaperone present for exam, small hemorrhoid at 9:00, soft, nontender, nonthrombosed, no fissures  Neurological: She is alert. Coordination normal.  Skin: Skin is warm  and dry. No rash noted. She is not diaphoretic. No erythema.  Psychiatric: She has a normal mood and affect.  Nursing note and vitals reviewed.   ED Course  Procedures (including critical care time) Labs Review Labs Reviewed - No data to display  Imaging Review No results found.    MDM   Final diagnoses:  Hemorrhoids, unspecified hemorrhoid type    Well-appearing, small nonthrombosed hemorrhoid, vital signs normal, stable for discharge with hemorrhoidal medications, patient has been given the indications for return and has expressed her understanding.    Eber HongBrian Garnell Begeman, MD 01/31/15 1019

## 2015-02-10 ENCOUNTER — Encounter: Payer: Medicaid Other | Admitting: Cardiovascular Disease

## 2015-02-25 ENCOUNTER — Other Ambulatory Visit: Payer: Self-pay | Admitting: Endocrinology

## 2015-03-02 ENCOUNTER — Other Ambulatory Visit: Payer: Self-pay | Admitting: *Deleted

## 2015-03-02 ENCOUNTER — Emergency Department (HOSPITAL_COMMUNITY)
Admission: EM | Admit: 2015-03-02 | Discharge: 2015-03-02 | Disposition: A | Discharge status: Home / Self Care | Payer: Medicaid Other | Attending: Emergency Medicine | Admitting: Emergency Medicine

## 2015-03-02 ENCOUNTER — Encounter (HOSPITAL_COMMUNITY): Payer: Self-pay | Admitting: *Deleted

## 2015-03-02 ENCOUNTER — Telehealth: Payer: Self-pay | Admitting: Endocrinology

## 2015-03-02 DIAGNOSIS — Z872 Personal history of diseases of the skin and subcutaneous tissue: Secondary | ICD-10-CM | POA: Insufficient documentation

## 2015-03-02 DIAGNOSIS — R011 Cardiac murmur, unspecified: Secondary | ICD-10-CM | POA: Insufficient documentation

## 2015-03-02 DIAGNOSIS — Z792 Long term (current) use of antibiotics: Secondary | ICD-10-CM | POA: Insufficient documentation

## 2015-03-02 DIAGNOSIS — Z880 Allergy status to penicillin: Secondary | ICD-10-CM | POA: Insufficient documentation

## 2015-03-02 DIAGNOSIS — E039 Hypothyroidism, unspecified: Secondary | ICD-10-CM | POA: Insufficient documentation

## 2015-03-02 DIAGNOSIS — Z3202 Encounter for pregnancy test, result negative: Secondary | ICD-10-CM | POA: Insufficient documentation

## 2015-03-02 DIAGNOSIS — N390 Urinary tract infection, site not specified: Secondary | ICD-10-CM | POA: Insufficient documentation

## 2015-03-02 DIAGNOSIS — Z720 Tobacco use: Secondary | ICD-10-CM | POA: Insufficient documentation

## 2015-03-02 DIAGNOSIS — Z87448 Personal history of other diseases of urinary system: Secondary | ICD-10-CM | POA: Insufficient documentation

## 2015-03-02 DIAGNOSIS — Z950 Presence of cardiac pacemaker: Secondary | ICD-10-CM | POA: Insufficient documentation

## 2015-03-02 DIAGNOSIS — Z91040 Latex allergy status: Secondary | ICD-10-CM | POA: Insufficient documentation

## 2015-03-02 DIAGNOSIS — F419 Anxiety disorder, unspecified: Secondary | ICD-10-CM | POA: Insufficient documentation

## 2015-03-02 LAB — URINE MICROSCOPIC-ADD ON

## 2015-03-02 LAB — CBC WITH DIFFERENTIAL/PLATELET
BASOS PCT: 0 % (ref 0–1)
Basophils Absolute: 0 10*3/uL (ref 0.0–0.1)
Eosinophils Absolute: 0.1 10*3/uL (ref 0.0–0.7)
Eosinophils Relative: 2 % (ref 0–5)
HEMATOCRIT: 39.7 % (ref 36.0–46.0)
HEMOGLOBIN: 13.8 g/dL (ref 12.0–15.0)
Lymphocytes Relative: 27 % (ref 12–46)
Lymphs Abs: 1.4 10*3/uL (ref 0.7–4.0)
MCH: 31.9 pg (ref 26.0–34.0)
MCHC: 34.8 g/dL (ref 30.0–36.0)
MCV: 91.7 fL (ref 78.0–100.0)
MONO ABS: 0.5 10*3/uL (ref 0.1–1.0)
MONOS PCT: 10 % (ref 3–12)
NEUTROS ABS: 3.1 10*3/uL (ref 1.7–7.7)
Neutrophils Relative %: 61 % (ref 43–77)
Platelets: 249 10*3/uL (ref 150–400)
RBC: 4.33 MIL/uL (ref 3.87–5.11)
RDW: 12 % (ref 11.5–15.5)
WBC: 5 10*3/uL (ref 4.0–10.5)

## 2015-03-02 LAB — URINALYSIS, ROUTINE W REFLEX MICROSCOPIC
BILIRUBIN URINE: NEGATIVE
Glucose, UA: NEGATIVE mg/dL
Hgb urine dipstick: NEGATIVE
Ketones, ur: 40 mg/dL — AB
Nitrite: NEGATIVE
PH: 7 (ref 5.0–8.0)
Protein, ur: NEGATIVE mg/dL
SPECIFIC GRAVITY, URINE: 1.026 (ref 1.005–1.030)
Urobilinogen, UA: 1 mg/dL (ref 0.0–1.0)

## 2015-03-02 LAB — COMPREHENSIVE METABOLIC PANEL
ALK PHOS: 74 U/L (ref 38–126)
ALT: 18 U/L (ref 14–54)
AST: 19 U/L (ref 15–41)
Albumin: 4.1 g/dL (ref 3.5–5.0)
Anion gap: 9 (ref 5–15)
BUN: 7 mg/dL (ref 6–20)
CO2: 25 mmol/L (ref 22–32)
CREATININE: 0.81 mg/dL (ref 0.44–1.00)
Calcium: 8.8 mg/dL — ABNORMAL LOW (ref 8.9–10.3)
Chloride: 104 mmol/L (ref 101–111)
GFR calc Af Amer: >60 mL/min (ref >60)
GFR calc non Af Amer: >60 mL/min (ref >60)
Glucose, Bld: 100 mg/dL — ABNORMAL HIGH (ref 65–99)
POTASSIUM: 3.2 mmol/L — AB (ref 3.5–5.1)
Sodium: 138 mmol/L (ref 135–145)
TOTAL PROTEIN: 7.6 g/dL (ref 6.5–8.1)
Total Bilirubin: 0.4 mg/dL (ref 0.3–1.2)

## 2015-03-02 LAB — RAPID STREP SCREEN (MED CTR MEBANE ONLY): Streptococcus, Group A Screen (Direct): NEGATIVE

## 2015-03-02 LAB — LIPASE, BLOOD: Lipase: 14 U/L — ABNORMAL LOW (ref 22–51)

## 2015-03-02 LAB — WET PREP, GENITAL
Clue Cells Wet Prep HPF POC: NONE SEEN
Trich, Wet Prep: NONE SEEN
YEAST WET PREP: NONE SEEN

## 2015-03-02 LAB — PREGNANCY, URINE: PREG TEST UR: NEGATIVE

## 2015-03-02 MED ORDER — CEPHALEXIN 500 MG PO CAPS: 500.0000 mg | ORAL_CAPSULE | Freq: Four times a day (QID) | ORAL | Status: DC

## 2015-03-02 MED ORDER — LEVOTHYROXINE SODIUM 112 MCG PO TABS: 112.0000 ug | ORAL_TABLET | Freq: Every day | ORAL | Status: DC

## 2015-03-02 NOTE — Telephone Encounter (Signed)
Pt needs levothyroxine called in  

## 2015-03-02 NOTE — Telephone Encounter (Signed)
I spoke with patient to let her know she needed to be seen for more refills, appointment was made for tomorrow 06/09 but patient needs to have labs first then be seen.  Can you call and schedule please?

## 2015-03-02 NOTE — Discharge Instructions (Signed)

## 2015-03-02 NOTE — ED Provider Notes (Signed)
CSN: [id number]     Arrival date & time 03/02/15  1041 History   None    Chief Complaint  Patient presents with  . Abdominal Pain     (Consider location/radiation/quality/duration/timing/severity/associated sxs/prior Treatment) Patient is a 29 y.o. female presenting with abdominal pain. The history is provided by the patient. No language interpreter was used.  Abdominal Pain Pain location:  LLQ Pain quality: aching and fullness   Pain radiates to:  Does not radiate Pain severity:  Moderate Onset quality:  Gradual Duration:  2 days Timing:  Constant Progression:  Worsening Chronicity:  New Context: not recent illness   Relieved by:  Nothing Worsened by:  Nothing tried Ineffective treatments:  None tried Associated symptoms: no vaginal discharge and no vomiting    Pt worried about std's request gc, ct, hiv and rpr Past Medical History  Diagnosis Date  . Thyroid disease   . Neurocardiogenic syncope   . 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 Anxiety     panic attacks  . History of kidney infection   . Acne   . Heart murmur   . Orthopnoea   . Hypothyroidism   . Daily headache   . Neurocardiogenic syncope   . Panic attacks   . Symptomatic bradycardia    Past Surgical History  Procedure Laterality Date  . Thyroidectomy  11/04/2013  . Insert / replace / remove pacemaker  09/2004  . Dilation and curettage of uterus  2013  . Thyroidectomy N/A 11/04/2013    Procedure: THYROIDECTOMY;  Surgeon: Ernestene MentionHaywood M Ingram, MD;  [facility];  Service: General;  Laterality: N/A;   Family History  Problem Relation Age of Onset  . Thyroid disease Mother   . Heart disease Mother   . Thyroid disease Maternal Uncle   . Thyroid disease Maternal Grandmother   . Heart failure Maternal Grandmother   . Hypertension Maternal Grandmother   . Hypertension Maternal Grandfather    History  Substance Use Topics  . Smoking status: Current Every Day Smoker -- .5 years    Types: Cigars   . Smokeless tobacco: Never Used     Comment: 11/04/2013 "smoke 3 black N milds/day"  . Alcohol Use: Yes     Comment: 11/04/2013 "mixed drinks or wine once a month   OB History    Gravida Para Term Preterm AB TAB SAB Ectopic Multiple Living   2 2        2      Review of Systems  Gastrointestinal: Positive for abdominal pain. Negative for vomiting.  Genitourinary: Negative for vaginal discharge.  All other systems reviewed and are negative.     Allergies  Banana; Penicillins; and Latex  Home Medications   Prior to Admission medications   Medication Sig Start Date End Date Taking? Authorizing Provider  benzoyl peroxide (BREVOXYL) 4 % lotion Apply 1 application topically 2 (two) times daily.    Yes Historical Provider, MD  cyclobenzaprine (FLEXERIL) 10 MG tablet Take 10 mg by mouth daily as needed for muscle spasms.    Yes Historical Provider, MD  docusate sodium (COLACE) 100 MG capsule Take 1 capsule (100 mg total) by mouth every 12 (twelve) hours. 01/31/15  Yes Eber HongBrian Miller, MD  HYDROcodone-acetaminophen (NORCO/VICODIN) 5-325 MG per tablet Take 1 tablet by mouth every 6 (six) hours as needed for moderate pain.   Yes Historical Provider, MD  levothyroxine (SYNTHROID, LEVOTHROID) 112 MCG tablet TAKE ONE TABLET BY MOUTH ONCE DAILY 01/21/15  Yes Ajay  Lucianne Muss, MD  PREDNISONE PO Take 1 tablet by mouth once.   Yes Historical Provider, MD  traZODone (DESYREL) 50 MG tablet Take 50 mg by mouth at bedtime.   Yes Historical Provider, MD   BP 130/89 mmHg  Pulse 99  Temp(Src) 99.3 F (37.4 C) (Oral)  Resp 16  Ht 5' (1.524 m)  Wt 135 lb (61.236 kg)  BMI 26.37 kg/m2  SpO2 100% Physical Exam  Constitutional: She is oriented to person, place, and time. She appears well-developed and well-nourished.  HENT:  Head: Normocephalic and atraumatic.  Right Ear: External ear normal.  Left Ear: External ear normal.  Mouth/Throat: Oropharynx is clear and moist.  Eyes: Conjunctivae and EOM are normal.  Pupils are equal, round, and reactive to light.  Neck: Normal range of motion.  Cardiovascular: Normal rate and normal heart sounds.   Pulmonary/Chest: Effort normal.  Abdominal: She exhibits no distension.  Musculoskeletal: Normal range of motion.  Neurological: She is alert and oriented to person, place, and time.  Skin: Skin is warm.  Psychiatric: She has a normal mood and affect.  Nursing note and vitals reviewed.   ED Course  Procedures (including critical care time) Labs Review Labs Reviewed  CBC WITH DIFFERENTIAL/PLATELET  COMPREHENSIVE METABOLIC PANEL  LIPASE, BLOOD  URINALYSIS, ROUTINE W REFLEX MICROSCOPIC (NOT AT [facility])  PREGNANCY, URINE   Results for orders placed or performed during the hospital encounter of 03/02/15  Rapid strep screen  Result Value Ref Range   Streptococcus, Group A Screen (Direct) NEGATIVE NEGATIVE  Wet prep, genital  Result Value Ref Range   Yeast Wet Prep HPF POC NONE SEEN NONE SEEN   Trich, Wet Prep NONE SEEN NONE SEEN   Clue Cells Wet Prep HPF POC NONE SEEN NONE SEEN   WBC, Wet Prep HPF POC FEW (A) NONE SEEN  CBC WITH DIFFERENTIAL  Result Value Ref Range   WBC 5.0 4.0 - 10.5 K/uL   RBC 4.33 3.87 - 5.11 MIL/uL   Hemoglobin 13.8 12.0 - 15.0 g/dL   HCT 40.9 81.1 - 91.4 %   MCV 91.7 78.0 - 100.0 fL   MCH 31.9 26.0 - 34.0 pg   MCHC 34.8 30.0 - 36.0 g/dL   RDW 78.2 95.6 - 21.3 %   Platelets 249 150 - 400 K/uL   Neutrophils Relative % 61 43 - 77 %   Neutro Abs 3.1 1.7 - 7.7 K/uL   Lymphocytes Relative 27 12 - 46 %   Lymphs Abs 1.4 0.7 - 4.0 K/uL   Monocytes Relative 10 3 - 12 %   Monocytes Absolute 0.5 0.1 - 1.0 K/uL   Eosinophils Relative 2 0 - 5 %   Eosinophils Absolute 0.1 0.0 - 0.7 K/uL   Basophils Relative 0 0 - 1 %   Basophils Absolute 0.0 0.0 - 0.1 K/uL  Comprehensive metabolic panel  Result Value Ref Range   Sodium 138 135 - 145 mmol/L   Potassium 3.2 (L) 3.5 - 5.1 mmol/L   Chloride 104 101 - 111 mmol/L   CO2 25 22 - 32  mmol/L   Glucose, Bld 100 (H) 65 - 99 mg/dL   BUN 7 6 - 20 mg/dL   Creatinine, Ser 0.86 0.44 - 1.00 mg/dL   Calcium 8.8 (L) 8.9 - 10.3 mg/dL   Total Protein 7.6 6.5 - 8.1 g/dL   Albumin 4.1 3.5 - 5.0 g/dL   AST 19 15 - 41 U/L   ALT 18 14 - 54 U/L  Alkaline Phosphatase 74 38 - 126 U/L   Total Bilirubin 0.4 0.3 - 1.2 mg/dL   GFR calc non Af Amer >60 >60 mL/min   GFR calc Af Amer >60 >60 mL/min   Anion gap 9 5 - 15  Lipase, blood  Result Value Ref Range   Lipase 14 (L) 22 - 51 U/L  Urinalysis, Routine w reflex microscopic (not at [facility])  Result Value Ref Range   Color, Urine YELLOW YELLOW   APPearance CLOUDY (A) CLEAR   Specific Gravity, Urine 1.026 1.005 - 1.030   pH 7.0 5.0 - 8.0   Glucose, UA NEGATIVE NEGATIVE mg/dL   Hgb urine dipstick NEGATIVE NEGATIVE   Bilirubin Urine NEGATIVE NEGATIVE   Ketones, ur 40 (A) NEGATIVE mg/dL   Protein, ur NEGATIVE NEGATIVE mg/dL   Urobilinogen, UA 1.0 0.0 - 1.0 mg/dL   Nitrite NEGATIVE NEGATIVE   Leukocytes, UA TRACE (A) NEGATIVE  Pregnancy, urine  Result Value Ref Range   Preg Test, Ur NEGATIVE NEGATIVE  Urine microscopic-add on  Result Value Ref Range   Squamous Epithelial / LPF MANY (A) RARE   WBC, UA 3-6 <3 WBC/hpf   Bacteria, UA MANY (A) RARE   Urine-Other MUCOUS PRESENT    No results found.   Imaging Review No results found.   EKG Interpretation None      MDM   Final diagnoses:  UTI (lower urinary tract infection)    Keflex See your Physicain for recheck Gc/ct/rpr and HIV pending    Elson Areas, PA-C 03/02/15 1409  Lonia Skinner [place], PA-C 03/02/15 1424  Samuel Jester, DO 03/05/15 1156

## 2015-03-02 NOTE — ED Notes (Signed)
Pt reports abdominal pain to left side, sore throat, no fevers. Diarrhea.

## 2015-03-03 ENCOUNTER — Ambulatory Visit: Payer: Medicaid Other | Admitting: Endocrinology

## 2015-03-03 ENCOUNTER — Other Ambulatory Visit (INDEPENDENT_AMBULATORY_CARE_PROVIDER_SITE_OTHER): Payer: BLUE CROSS/BLUE SHIELD

## 2015-03-03 DIAGNOSIS — E890 Postprocedural hypothyroidism: Secondary | ICD-10-CM

## 2015-03-03 LAB — GC/CHLAMYDIA PROBE AMP (~~LOC~~) NOT AT ARMC
Chlamydia: NEGATIVE
NEISSERIA GONORRHEA: NEGATIVE

## 2015-03-03 LAB — URINE CULTURE
COLONY COUNT: NO GROWTH
Culture: NO GROWTH
Special Requests: NORMAL

## 2015-03-03 LAB — TSH: TSH: 2.75 u[IU]/mL (ref 0.35–4.50)

## 2015-03-03 LAB — HIV ANTIBODY (ROUTINE TESTING W REFLEX): HIV Screen 4th Generation wRfx: NONREACTIVE

## 2015-03-03 LAB — RPR: RPR: NONREACTIVE

## 2015-03-04 ENCOUNTER — Telehealth: Payer: Self-pay | Admitting: *Deleted

## 2015-03-04 LAB — CULTURE, GROUP A STREP: STREP A CULTURE: NEGATIVE

## 2015-03-04 NOTE — Telephone Encounter (Signed)
Pt is having a white tongue and would like to get a medication for this

## 2015-03-07 ENCOUNTER — Encounter: Payer: Self-pay | Admitting: Endocrinology

## 2015-03-07 ENCOUNTER — Ambulatory Visit (INDEPENDENT_AMBULATORY_CARE_PROVIDER_SITE_OTHER): Payer: BLUE CROSS/BLUE SHIELD | Admitting: Endocrinology

## 2015-03-07 VITALS — BP 120/80 | HR 101 | Temp 98.7°F | Resp 16 | Wt 132.0 lb

## 2015-03-07 DIAGNOSIS — R630 Anorexia: Secondary | ICD-10-CM

## 2015-03-07 DIAGNOSIS — E890 Postprocedural hypothyroidism: Secondary | ICD-10-CM | POA: Diagnosis not present

## 2015-03-07 MED ORDER — POTASSIUM CHLORIDE CRYS ER 20 MEQ PO TBCR: 20.0000 meq | EXTENDED_RELEASE_TABLET | Freq: Every day | ORAL | Status: DC

## 2015-03-07 MED ORDER — NYSTATIN 100000 UNIT/ML MT SUSP: 5.0000 mL | Freq: Four times a day (QID) | OROMUCOSAL | Status: DC

## 2015-03-07 MED ORDER — LEVOTHYROXINE SODIUM 112 MCG PO TABS: 112.0000 ug | ORAL_TABLET | Freq: Every day | ORAL | Status: DC

## 2015-03-07 NOTE — Progress Notes (Signed)
Jean Rowe           Reason for Appointment: Follow up of postsurgical hypothyroidism      History of Present Illness:   She was initially seen in consultation for her goiter in 06/2013 At that time she was having symptomatic enlargement of her thyroid. She had difficulty with swallowing food and also even her own saliva. Apparently sometimes with force her food to go down as it is difficult to swallow. She was also choking on liquids at times. She had a pressure sensation in her neck at times and also difficulty sleeping flat on her back. Because of significant thyroid enlargement clinically especially on the right side and on CT scan she was referred for thyroidectomy.  Since her thyroidectomy she did not have any difficulty swallowing and also  no pressure sensation in her neck or choking She initially was on 75 mcg of thyroxine supplement after her surgery on 11/04/13  RECENT history:  She has been maintained on 112 g of levothyroxine with consistent control of her thyroid and stable thyroid levels She has been very compliant with taking this in the morning Does not complain of any unusual fatigue although has not been generally feeling well in the last month because of other problems She has lost weight from intercurrent issues this year  Wt Readings from Last 3 Encounters:  03/07/15 132 lb (59.875 kg)  03/02/15 135 lb (61.236 kg)  10/17/14 145 lb (65.772 kg)     Lab Results  Component Value Date   FREET4 1.05 07/14/2014   FREET4 0.82 09/01/2013   TSH 2.75 03/03/2015   TSH 0.80 07/14/2014        Medication List       This list is accurate as of: 03/07/15  2:19 PM.  Always use your most recent med list.               benzoyl peroxide 4 % lotion  Commonly known as:  BREVOXYL  Apply 1 application topically 2 (two) times daily.     cephALEXin 500 MG capsule  Commonly known as:  KEFLEX  Take 1 capsule (500 mg total) by mouth 4 (four) times daily.     cyclobenzaprine 10 MG tablet  Commonly known as:  FLEXERIL  Take 10 mg by mouth daily as needed for muscle spasms.     docusate sodium 100 MG capsule  Commonly known as:  COLACE  Take 1 capsule (100 mg total) by mouth every 12 (twelve) hours.     HYDROcodone-acetaminophen 5-325 MG per tablet  Commonly known as:  NORCO/VICODIN  Take 1 tablet by mouth every 6 (six) hours as needed for moderate pain.     levothyroxine 112 MCG tablet  Commonly known as:  SYNTHROID, LEVOTHROID  Take 1 tablet (112 mcg total) by mouth daily.     PREDNISONE PO  Take 1 tablet by mouth once.     traZODone 50 MG tablet  Commonly known as:  DESYREL  Take 50 mg by mouth at bedtime.        Allergies:  Allergies  Allergen Reactions  . Banana Anaphylaxis    Swelling of throat  . Penicillins Other (See Comments)    Unknown childhood rxn  . Latex Rash    Past Medical History  Diagnosis Date  . Thyroid disease   . Neurocardiogenic syncope   . 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 Anxiety     panic attacks  .  History of kidney infection   . Acne   . Heart murmur   . Orthopnoea   . Hypothyroidism   . Daily headache   . Neurocardiogenic syncope   . Panic attacks   . Symptomatic bradycardia     Past Surgical History  Procedure Laterality Date  . Thyroidectomy  11/04/2013  . Insert / replace / remove pacemaker  09/2004  . Dilation and curettage of uterus  2013  . Thyroidectomy N/A 11/04/2013    Procedure: THYROIDECTOMY;  Surgeon: Ernestene Mention, MD;  Location: [facility] OR;  Service: General;  Laterality: N/A;    Family History  Problem Relation Age of Onset  . Thyroid disease Mother   . Heart disease Mother   . Thyroid disease Maternal Uncle   . Thyroid disease Maternal Grandmother   . Heart failure Maternal Grandmother   . Hypertension Maternal Grandmother   . Hypertension Maternal Grandfather    Most of her family members have had surgery for thyroid enlargement  Social  History:  reports that she has been smoking Cigars.  She has never used smokeless tobacco. She reports that she drinks alcohol. She reports that she does not use illicit drugs.   Review of Systems:  She is complaining of significantly decreased appetite and abdominal pain for the last month along with some weight loss  She was told to have a UTI in the emergency room but review of labs does not indicate this and her culture was negative also  No history of hypertension     Examination:   BP 120/80 mmHg  Pulse 101  Temp(Src) 98.7 F (37.1 C) (Oral)  Resp 16  Wt 132 lb (59.875 kg)  SpO2 95%  Her tongue slightly coated and also has some whitish areas on her cheek mucosa Neck: She has a well-healed scar of thyroidectomy, no local mass felt Biceps reflexes show a normal relaxation  Assessment/Plan:  Postsurgical hypothyroidism   Her TSH is consistent despite her weight loss and she can continue the same dose   Discussed that her current symptoms aren't related to hypothyroidism  She will follow-up in 6 months.  HYPOKALEMIA: She had a low potassium of 3.2 in the ER about 6 days ago and did not get any supplements.  She does not like to eat bananas. Will send her a 10 day prescription for potassium and she will follow-up with PCP  Coated tongue: This may be oral candidiasis and will prescribe her Mycostatin oral suspension  Decreased appetite, abdominal discomfort: She will call her PCP for appointment  Stop cephalexin as she does not have a UTI and this will worsen her oral candidiasis  Jakorian Marengo 06/24/2013, 11:25 AM

## 2015-03-08 ENCOUNTER — Other Ambulatory Visit: Payer: Self-pay | Admitting: Family Medicine

## 2015-03-08 DIAGNOSIS — R102 Pelvic and perineal pain: Secondary | ICD-10-CM

## 2015-03-11 ENCOUNTER — Ambulatory Visit
Admission: RE | Admit: 2015-03-11 | Discharge: 2015-03-11 | Disposition: A | Discharge status: Home / Self Care | Payer: BLUE CROSS/BLUE SHIELD | Source: Ambulatory Visit | Attending: Family Medicine | Admitting: Family Medicine

## 2015-03-11 DIAGNOSIS — R102 Pelvic and perineal pain: Secondary | ICD-10-CM

## 2015-06-03 ENCOUNTER — Encounter (HOSPITAL_COMMUNITY): Payer: Self-pay | Admitting: Emergency Medicine

## 2015-06-03 ENCOUNTER — Emergency Department (HOSPITAL_COMMUNITY)
Admission: EM | Admit: 2015-06-03 | Discharge: 2015-06-04 | Disposition: A | Discharge status: Home / Self Care | Payer: Self-pay | Attending: Emergency Medicine | Admitting: Emergency Medicine

## 2015-06-03 ENCOUNTER — Emergency Department (HOSPITAL_COMMUNITY): Payer: Self-pay

## 2015-06-03 ENCOUNTER — Emergency Department (HOSPITAL_COMMUNITY): Payer: BLUE CROSS/BLUE SHIELD

## 2015-06-03 DIAGNOSIS — Z720 Tobacco use: Secondary | ICD-10-CM | POA: Insufficient documentation

## 2015-06-03 DIAGNOSIS — E039 Hypothyroidism, unspecified: Secondary | ICD-10-CM | POA: Insufficient documentation

## 2015-06-03 DIAGNOSIS — R011 Cardiac murmur, unspecified: Secondary | ICD-10-CM | POA: Insufficient documentation

## 2015-06-03 DIAGNOSIS — R002 Palpitations: Secondary | ICD-10-CM | POA: Insufficient documentation

## 2015-06-03 DIAGNOSIS — Z79899 Other long term (current) drug therapy: Secondary | ICD-10-CM | POA: Insufficient documentation

## 2015-06-03 DIAGNOSIS — F419 Anxiety disorder, unspecified: Secondary | ICD-10-CM | POA: Insufficient documentation

## 2015-06-03 DIAGNOSIS — R079 Chest pain, unspecified: Secondary | ICD-10-CM

## 2015-06-03 DIAGNOSIS — R0602 Shortness of breath: Secondary | ICD-10-CM | POA: Insufficient documentation

## 2015-06-03 DIAGNOSIS — Z880 Allergy status to penicillin: Secondary | ICD-10-CM | POA: Insufficient documentation

## 2015-06-03 DIAGNOSIS — K219 Gastro-esophageal reflux disease without esophagitis: Secondary | ICD-10-CM

## 2015-06-03 DIAGNOSIS — R1011 Right upper quadrant pain: Secondary | ICD-10-CM

## 2015-06-03 DIAGNOSIS — Z3202 Encounter for pregnancy test, result negative: Secondary | ICD-10-CM | POA: Insufficient documentation

## 2015-06-03 DIAGNOSIS — Z950 Presence of cardiac pacemaker: Secondary | ICD-10-CM | POA: Insufficient documentation

## 2015-06-03 DIAGNOSIS — Z91040 Latex allergy status: Secondary | ICD-10-CM | POA: Insufficient documentation

## 2015-06-03 HISTORY — DX: Personal history of other diseases of the digestive system: Z87.19

## 2015-06-03 LAB — LIPASE, BLOOD: LIPASE: 19 U/L — AB (ref 22–51)

## 2015-06-03 LAB — CBC
HEMATOCRIT: 36.9 % (ref 36.0–46.0)
Hemoglobin: 12.7 g/dL (ref 12.0–15.0)
MCH: 32.9 pg (ref 26.0–34.0)
MCHC: 34.4 g/dL (ref 30.0–36.0)
MCV: 95.6 fL (ref 78.0–100.0)
PLATELETS: 237 10*3/uL (ref 150–400)
RBC: 3.86 MIL/uL — AB (ref 3.87–5.11)
RDW: 12.3 % (ref 11.5–15.5)
WBC: 6.3 10*3/uL (ref 4.0–10.5)

## 2015-06-03 LAB — URINALYSIS, ROUTINE W REFLEX MICROSCOPIC
Bilirubin Urine: NEGATIVE
GLUCOSE, UA: NEGATIVE mg/dL
Hgb urine dipstick: NEGATIVE
KETONES UR: NEGATIVE mg/dL
LEUKOCYTES UA: NEGATIVE
Nitrite: NEGATIVE
PH: 8 (ref 5.0–8.0)
Protein, ur: NEGATIVE mg/dL
SPECIFIC GRAVITY, URINE: 1.021 (ref 1.005–1.030)
Urobilinogen, UA: 0.2 mg/dL (ref 0.0–1.0)

## 2015-06-03 LAB — COMPREHENSIVE METABOLIC PANEL
ALT: 14 U/L (ref 14–54)
AST: 21 U/L (ref 15–41)
Albumin: 3.7 g/dL (ref 3.5–5.0)
Alkaline Phosphatase: 55 U/L (ref 38–126)
Anion gap: 4 — ABNORMAL LOW (ref 5–15)
BUN: 5 mg/dL — ABNORMAL LOW (ref 6–20)
CHLORIDE: 107 mmol/L (ref 101–111)
CO2: 25 mmol/L (ref 22–32)
CREATININE: 0.88 mg/dL (ref 0.44–1.00)
Calcium: 8.7 mg/dL — ABNORMAL LOW (ref 8.9–10.3)
GFR calc non Af Amer: >60 mL/min (ref >60)
Glucose, Bld: 85 mg/dL (ref 65–99)
Potassium: 3.8 mmol/L (ref 3.5–5.1)
SODIUM: 136 mmol/L (ref 135–145)
Total Bilirubin: 0.5 mg/dL (ref 0.3–1.2)
Total Protein: 6.3 g/dL — ABNORMAL LOW (ref 6.5–8.1)

## 2015-06-03 LAB — I-STAT BETA HCG BLOOD, ED (MC, WL, AP ONLY): I-stat hCG, quantitative: <5 m[IU]/mL (ref <5)

## 2015-06-03 LAB — I-STAT TROPONIN, ED: TROPONIN I, POC: 0 ng/mL (ref 0.00–0.08)

## 2015-06-03 MED ORDER — SODIUM CHLORIDE 0.9 % IV BOLUS (SEPSIS)
1000.0000 mL | Freq: Once | INTRAVENOUS | Status: AC
  Administered 2015-06-03: 1000 mL via INTRAVENOUS

## 2015-06-03 MED ORDER — GI COCKTAIL ~~LOC~~
30.0000 mL | Freq: Once | ORAL | Status: AC
  Administered 2015-06-03: 30 mL via ORAL
  Filled 2015-06-03: qty 30

## 2015-06-03 MED ORDER — FENTANYL CITRATE (PF) 100 MCG/2ML IJ SOLN
100.0000 ug | Freq: Once | INTRAMUSCULAR | Status: AC
  Administered 2015-06-03: 100 ug via INTRAVENOUS
  Filled 2015-06-03: qty 2

## 2015-06-03 MED ORDER — DICYCLOMINE HCL 10 MG PO CAPS
10.0000 mg | ORAL_CAPSULE | Freq: Once | ORAL | Status: AC
  Administered 2015-06-03: 10 mg via ORAL
  Filled 2015-06-03: qty 1

## 2015-06-03 MED ORDER — OMEPRAZOLE 20 MG PO CPDR: 20.0000 mg | DELAYED_RELEASE_CAPSULE | Freq: Every day | ORAL | Status: DC

## 2015-06-03 MED ORDER — DICYCLOMINE HCL 20 MG PO TABS: 20.0000 mg | ORAL_TABLET | Freq: Two times a day (BID) | ORAL | Status: DC

## 2015-06-03 MED ORDER — ONDANSETRON HCL 4 MG/2ML IJ SOLN
4.0000 mg | Freq: Once | INTRAMUSCULAR | Status: AC
  Administered 2015-06-03: 4 mg via INTRAVENOUS
  Filled 2015-06-03: qty 2

## 2015-06-03 NOTE — ED Notes (Signed)
PA Dansie at bedside  

## 2015-06-03 NOTE — ED Notes (Signed)
Pt. Called out asking for discharge papers stating she is ready to go. Nurse notified.

## 2015-06-03 NOTE — ED Notes (Signed)
Patient transported to Ultrasound 

## 2015-06-03 NOTE — ED Provider Notes (Signed)
CSN: [id number]     Arrival date & time 06/03/15  1138 History   First MD Initiated Contact with Patient 06/03/15 1537     Chief Complaint  Patient presents with  . Chest Pain  . Generalized Body Aches   Jean Rowe is a 29 y.o. female with a history of cardiogenic syncope with a pacemaker, anxiety, heart murmur, and IBS who presents to the emergency department complaining of substernal chest pain beginning yesterday and has been constant. She currently complains of 8 out of 10 sharp substernal chest pain that is nonradiating. The patient reports that last night she awoke with it out of 10 substernal chest pain that radiated to her left arm and she felt numbness and tingling in her left hand. She reports this has since resolved. She reports associated nausea, burping, belching and acid reflux symptoms with her pain. Patient reports taking some Xanax because she thought it was anxiety without relief. Patient also reports she's been having chronic generalized body aches for the past 5 months. She also reports she's been having chronic slight shortness of breath which is not new and has been ongoing for the past several months. She reports she has palpitations intermittently due to her neurocardiogenic and pacemaker. She reports this is not new. She reports that over the past 4 months she has been having right upper quadrant abdominal pain after eating with associated nausea and diarrhea after eating. She has not had any ultrasounds of her right upper quadrant. The patient denies personal or close family history of MI. The patient denies personal or close family history of DVTs or PEs. Patient denies recent long travel, leg swelling, or endogenous estrogen use. Patient denies recent surgery or hemoptysis. She denies syncope, fevers, dizziness, vomiting, hematochezia, weakness, cough, wheezing.   (Consider location/radiation/quality/duration/timing/severity/associated sxs/prior Treatment) HPI  Past  Medical History  Diagnosis Date  . Thyroid disease   . Neurocardiogenic syncope   . 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 Anxiety     panic attacks  . History of kidney infection   . Acne   . Heart murmur   . Orthopnoea   . Hypothyroidism   . Daily headache   . Neurocardiogenic syncope   . Panic attacks   . Symptomatic bradycardia   . History of IBS    Past Surgical History  Procedure Laterality Date  . Thyroidectomy  11/04/2013  . Insert / replace / remove pacemaker  09/2004  . Dilation and curettage of uterus  2013  . Thyroidectomy N/A 11/04/2013    Procedure: THYROIDECTOMY;  Surgeon: Ernestene Mention, MD;  Location: [facility];  Service: General;  Laterality: N/A;   Family History  Problem Relation Age of Onset  . Thyroid disease Mother   . Heart disease Mother   . Thyroid disease Maternal Uncle   . Thyroid disease Maternal Grandmother   . Heart failure Maternal Grandmother   . Hypertension Maternal Grandmother   . Hypertension Maternal Grandfather    Social History  Substance Use Topics  . Smoking status: Current Every Day Smoker -- .5 years    Types: Cigars  . Smokeless tobacco: Never Used     Comment: 11/04/2013 "smoke 3 black N milds/day"  . Alcohol Use: Yes     Comment: 11/04/2013 "mixed drinks or wine once a month   OB History    Gravida Para Term Preterm AB TAB SAB Ectopic Multiple Living   2 2  2     Review of Systems  Constitutional: Negative for fever and chills.  HENT: Negative for congestion and sore throat.   Eyes: Negative for visual disturbance.  Respiratory: Positive for shortness of breath. Negative for cough, chest tightness and wheezing.   Cardiovascular: Positive for chest pain and palpitations. Negative for leg swelling.  Gastrointestinal: Positive for nausea, abdominal pain and diarrhea. Negative for vomiting and blood in stool.  Genitourinary: Negative for dysuria, urgency, frequency, hematuria, vaginal bleeding, difficulty  urinating and pelvic pain.  Musculoskeletal: Positive for myalgias. Negative for back pain and neck pain.  Skin: Negative for rash.  Neurological: Negative for dizziness, syncope, weakness, numbness and headaches.      Allergies  Banana; Other; Latex; and Penicillins  Home Medications   Prior to Admission medications   Medication Sig Start Date End Date Taking? Authorizing Provider  ALPRAZolam (XANAX) 0.25 MG tablet Take 0.25 mg by mouth 2 (two) times daily as needed for anxiety.   Yes Historical Provider, MD  benzoyl peroxide (BREVOXYL) 4 % lotion Apply 1 application topically at bedtime.    Yes Historical Provider, MD  levothyroxine (SYNTHROID, LEVOTHROID) 112 MCG tablet Take 1 tablet (112 mcg total) by mouth daily. 03/07/15  Yes Reather Littler, MD  potassium chloride SA (K-DUR,KLOR-CON) 20 MEQ tablet Take 1 tablet (20 mEq total) by mouth daily. 03/07/15  Yes Reather Littler, MD  traZODone (DESYREL) 50 MG tablet Take 50 mg by mouth at bedtime.   Yes Historical Provider, MD  cyclobenzaprine (FLEXERIL) 10 MG tablet Take 10 mg by mouth daily as needed for muscle spasms.     Historical Provider, MD  dicyclomine (BENTYL) 20 MG tablet Take 1 tablet (20 mg total) by mouth 2 (two) times daily. 06/03/15   Everlene Farrier, PA-C  HYDROcodone-acetaminophen (NORCO/VICODIN) 5-325 MG per tablet Take 1 tablet by mouth every 6 (six) hours as needed for moderate pain.    Historical Provider, MD  nystatin (MYCOSTATIN) 100000 UNIT/ML suspension Take 5 mLs (500,000 Units total) by mouth 4 (four) times daily. 03/07/15   Reather Littler, MD  omeprazole (PRILOSEC) 20 MG capsule Take 1 capsule (20 mg total) by mouth daily. 06/03/15   Everlene Farrier, PA-C   BP 124/76 mmHg  Pulse 95  Temp(Src) 98.7 F (37.1 C) (Oral)  Resp 22  Ht 5' (1.524 m)  Wt 141 lb (63.957 kg)  BMI 27.54 kg/m2  SpO2 100% Physical Exam  Constitutional: She is oriented to person, place, and time. She appears well-developed and well-nourished. No distress.   Nontoxic appearing.  HENT:  Head: Normocephalic and atraumatic.  Mouth/Throat: Oropharynx is clear and moist.  Eyes: Conjunctivae are normal. Pupils are equal, round, and reactive to light. Right eye exhibits no discharge. Left eye exhibits no discharge.  Neck: Normal range of motion. Neck supple. No JVD present. No tracheal deviation present.  Cardiovascular: Normal rate, regular rhythm, normal heart sounds and intact distal pulses.  Exam reveals no gallop and no friction rub.   Bilateral radial, posterior tibialis and dorsalis pedis pulses are intact.   Pulmonary/Chest: Effort normal and breath sounds normal. No respiratory distress. She has no wheezes. She has no rales. She exhibits tenderness.  Lungs are clear to auscultation bilaterally. Substernal chest is tender to palpation and reproduces her chest pain.  Abdominal: Soft. Bowel sounds are normal. She exhibits no distension and no mass. There is tenderness. There is no rebound.  Abdomen is soft. Bowel sounds are present. Patient has right upper quadrant abdominal tenderness to  palpation with positive Murphy's sign.  Musculoskeletal: She exhibits no edema or tenderness.  No lower extremity edema or tenderness.  Lymphadenopathy:    She has no cervical adenopathy.  Neurological: She is alert and oriented to person, place, and time. No cranial nerve deficit. Coordination normal.  The patient is alert and oriented 3. Cranial nerves are intact. Sensation is intact her bilateral upper and lower extremities. She has good and equal grip strengths bilaterally. She has 5 out of 5 strength in her bilateral upper and lower extremities.  Skin: Skin is warm and dry. No rash noted. She is not diaphoretic. No erythema. No pallor.  Psychiatric: She has a normal mood and affect. Her behavior is normal.  Nursing note and vitals reviewed.   ED Course  Procedures (including critical care time) Labs Review Labs Reviewed  LIPASE, BLOOD - Abnormal;  Notable for the following:    Lipase 19 (*)    All other components within normal limits  COMPREHENSIVE METABOLIC PANEL - Abnormal; Notable for the following:    BUN 5 (*)    Calcium 8.7 (*)    Total Protein 6.3 (*)    Anion gap 4 (*)    All other components within normal limits  CBC - Abnormal; Notable for the following:    RBC 3.86 (*)    All other components within normal limits  URINALYSIS, ROUTINE W REFLEX MICROSCOPIC (NOT AT [facility])  I-STAT BETA HCG BLOOD, ED ([facility], [facility], [facility] ONLY)  I-STAT TROPOININ, ED    Imaging Review Dg Chest 2 View  06/03/2015   CLINICAL DATA:  Chest and abdominal pain with shortness of breath.  EXAM: CHEST  2 VIEW  COMPARISON:  10/26/2013  FINDINGS: Stable appearance of the left dual chamber cardiac pacemaker. Both lungs are clear without airspace disease or pulmonary edema. Heart and mediastinum are within normal limits. No acute bone abnormality.  IMPRESSION: No active cardiopulmonary disease.   Electronically Signed   By: Richarda Overlie M.D.   On: 06/03/2015 12:58   US Abdomen Complete  06/03/2015   CLINICAL DATA:  Right upper quadrant pain, nausea, and diarrhea  EXAM: ULTRASOUND ABDOMEN COMPLETE  COMPARISON:  None.  FINDINGS: Gallbladder: No gallstones or wall thickening visualized. No sonographic Murphy sign noted.  Common bile duct: Diameter: 2.5 mm  Liver: No focal lesion identified. Within normal limits in parenchymal echogenicity.  IVC: No abnormality visualized.  Pancreas: Visualized portion unremarkable.  Spleen: Size and appearance within normal limits.  Right Kidney: Length: 12 cm. Echogenicity within normal limits. No mass or hydronephrosis visualized.  Left Kidney: Length: 11 cm. Echogenicity within normal limits. No mass or hydronephrosis visualized.  Abdominal aorta: No aneurysm visualized.  Other findings: None.  IMPRESSION: Normal abdomen ultrasound   Electronically Signed   By: Esperanza Heir M.D.   On: 06/03/2015 17:38   I have personally reviewed and  evaluated these images and lab results as part of my medical decision-making.   EKG Interpretation   Date/Time:  Friday June 03 2015 11:43:58 EDT Ventricular Rate:  81 PR Interval:  130 QRS Duration: 76 QT Interval:  354 QTC Calculation: 411 R Axis:   65 Text Interpretation:  Normal sinus rhythm T wave abnormality, consider  inferior ischemia Abnormal ECG TWI in III and aVF new from prior Confirmed  by [facility] MD, ERIN ([id number]) on 06/03/2015 8:46:36 PM      Filed Vitals:   06/03/15 1945 06/03/15 2000 06/03/15 2015 06/03/15 2030  BP: 124/74 126/92 127/93 124/76  Pulse: 67 76 71 95  Temp:      TempSrc:      Resp: 22 17 21 22   Height:      Weight:      SpO2: 100% 100% 100% 100%     MDM   Meds given in ED:  Medications  dicyclomine (BENTYL) capsule 10 mg (not administered)  fentaNYL (SUBLIMAZE) injection 100 mcg (not administered)  sodium chloride 0.9 % bolus 1,000 mL (0 mLs Intravenous Stopped 06/03/15 1722)  ondansetron (ZOFRAN) injection 4 mg (4 mg Intravenous Given 06/03/15 1617)  gi cocktail (Maalox,Lidocaine,Donnatal) (30 mLs Oral Given 06/03/15 1617)    New Prescriptions   DICYCLOMINE (BENTYL) 20 MG TABLET    Take 1 tablet (20 mg total) by mouth 2 (two) times daily.   OMEPRAZOLE (PRILOSEC) 20 MG CAPSULE    Take 1 capsule (20 mg total) by mouth daily.    Final diagnoses:  Chest pain, unspecified chest pain type  RUQ abdominal pain  Gastroesophageal reflux disease, esophagitis presence not specified   Patient presented with constant chest pain since last night with associated acid reflux symptoms and abdominal pain for several months.  Chest pain is reproducible on palpation. CMP is unremarkable. Normal liver enzymes. Urinalysis is unremarkable. Negative pregnancy test. No leukocytosis on CBC. No syncope. Pain improved with GI cocktail in ED. abdominal ultrasound is unremarkable. Patient is to be discharged with recommendation to follow up with PCP in regards to  today's hospital visit. Chest pain is not likely of cardiac or pulmonary etiology due to presentation, perc negative, VSS, no tracheal deviation, no JVD or new murmur, RRR, breath sounds equal bilaterally, EKG without acute abnormalities, negative troponin, and negative CXR. HEART score is 2. Patient has been advised to return to the ED if chest pain becomes exertional, associated with diaphoresis or nausea, radiates to left jaw/arm, worsens or becomes concerning in any way. Patient appears reliable for follow up and is agreeable to discharge. We'll discharge with prescriptions for omeprazole and Bentyl. Patient's pain might be related to her acid reflux symptoms. I advised the patient to follow-up with their primary care provider this week. I advised the patient to return to the emergency department with new or worsening symptoms or new concerns. The patient verbalized understanding and agreement with plan.    This patient was discussed with Dr. Dalene Seltzer who agrees with assessment and plan.      Everlene Farrier, PA-C 06/03/15 2050  Alvira Monday, MD 06/07/15 2117

## 2015-06-03 NOTE — Discharge Instructions (Signed)
Chest Pain (Nonspecific) °It is often hard to give a specific diagnosis for the cause of chest pain. There is always a chance that your pain could be related to something serious, such as a heart attack or a blood clot in the lungs. You need to follow up with your health care provider for further evaluation. °CAUSES  °· Heartburn. °· Pneumonia or bronchitis. °· Anxiety or stress. °· Inflammation around your heart (pericarditis) or lung (pleuritis or pleurisy). °· A blood clot in the lung. °· A collapsed lung (pneumothorax). It can develop suddenly on its own (spontaneous pneumothorax) or from trauma to the chest. °· Shingles infection (herpes zoster virus). °The chest wall is composed of bones, muscles, and cartilage. Any of these can be the source of the pain. °· The bones can be bruised by injury. °· The muscles or cartilage can be strained by coughing or overwork. °· The cartilage can be affected by inflammation and become sore (costochondritis). °DIAGNOSIS  °Lab tests or other studies may be needed to find the cause of your pain. Your health care provider may have you take a test called an ambulatory electrocardiogram (ECG). An ECG records your heartbeat patterns over a 24-hour period. You may also have other tests, such as: °· Transthoracic echocardiogram (TTE). During echocardiography, sound waves are used to evaluate how blood flows through your heart. °· Transesophageal echocardiogram (TEE). °· Cardiac monitoring. This allows your health care provider to monitor your heart rate and rhythm in real time. °· Holter monitor. This is a portable device that records your heartbeat and can help diagnose heart arrhythmias. It allows your health care provider to track your heart activity for several days, if needed. °· Stress tests by exercise or by giving medicine that makes the heart beat faster. °TREATMENT  °· Treatment depends on what may be causing your chest pain. Treatment may include: °· Acid blockers for  heartburn. °· Anti-inflammatory medicine. °· Pain medicine for inflammatory conditions. °· Antibiotics if an infection is present. °· You may be advised to change lifestyle habits. This includes stopping smoking and avoiding alcohol, caffeine, and chocolate. °· You may be advised to keep your head raised (elevated) when sleeping. This reduces the chance of acid going backward from your stomach into your esophagus. °Most of the time, nonspecific chest pain will improve within 2-3 days with rest and mild pain medicine.  °HOME CARE INSTRUCTIONS  °· If antibiotics were prescribed, take them as directed. Finish them even if you start to feel better. °· For the next few days, avoid physical activities that bring on chest pain. Continue physical activities as directed. °· Do not use any tobacco products, including cigarettes, chewing tobacco, or electronic cigarettes. °· Avoid drinking alcohol. °· Only take medicine as directed by your health care provider. °· Follow your health care provider's suggestions for further testing if your chest pain does not go away. °· Keep any follow-up appointments you made. If you do not go to an appointment, you could develop lasting (chronic) problems with pain. If there is any problem keeping an appointment, call to reschedule. °SEEK MEDICAL CARE IF:  °· Your chest pain does not go away, even after treatment. °· You have a rash with blisters on your chest. °· You have a fever. °SEEK IMMEDIATE MEDICAL CARE IF:  °· You have increased chest pain or pain that spreads to your arm, neck, jaw, back, or abdomen. °· You have shortness of breath. °· You have an increasing cough, or you cough   up blood.  You have severe back or abdominal pain.  You feel nauseous or vomit.  You have severe weakness.  You faint.  You have chills. This is an emergency. Do not wait to see if the pain will go away. Get medical help at once. Call your local emergency services (911 in [place] Do not drive  yourself to the hospital. MAKE SURE YOU:   Understand these instructions.  Will watch your condition.  Will get help right away if you are not doing well or get worse. Document Released: 06/20/2005 Document Revised: 09/15/2013 Document Reviewed: 04/15/2008 [facility] Patient Information 2015 [place], [place]. This information is not intended to replace advice given to you by your health care provider. Make sure you discuss any questions you have with your health care provider.  Abdominal Pain Many things can cause abdominal pain. Usually, abdominal pain is not caused by a disease and will improve without treatment. It can often be observed and treated at home. Your health care provider will do a physical exam and possibly order blood tests and X-rays to help determine the seriousness of your pain. However, in many cases, more time must pass before a clear cause of the pain can be found. Before that point, your health care provider may not know if you need more testing or further treatment. HOME CARE INSTRUCTIONS  Monitor your abdominal pain for any changes. The following actions may help to alleviate any discomfort you are experiencing:  Only take over-the-counter or prescription medicines as directed by your health care provider.  Do not take laxatives unless directed to do so by your health care provider.  Try a clear liquid diet (broth, tea, or water) as directed by your health care provider. Slowly move to a bland diet as tolerated. SEEK MEDICAL CARE IF:  You have unexplained abdominal pain.  You have abdominal pain associated with nausea or diarrhea.  You have pain when you urinate or have a bowel movement.  You experience abdominal pain that wakes you in the night.  You have abdominal pain that is worsened or improved by eating food.  You have abdominal pain that is worsened with eating fatty foods.  You have a fever. SEEK IMMEDIATE MEDICAL CARE IF:   Your pain does not go  away within 2 hours.  You keep throwing up (vomiting).  Your pain is felt only in portions of the abdomen, such as the right side or the left lower portion of the abdomen.  You pass bloody or black tarry stools. MAKE SURE YOU:  Understand these instructions.   Will watch your condition.   Will get help right away if you are not doing well or get worse.  Document Released: 06/20/2005 Document Revised: 09/15/2013 Document Reviewed: 05/20/2013 [facility] Patient Information 2015 [place], [place]. This information is not intended to replace advice given to you by your health care provider. Make sure you discuss any questions you have with your health care provider. Gastroesophageal Reflux Disease, Adult Gastroesophageal reflux disease (GERD) happens when acid from your stomach flows up into the esophagus. When acid comes in contact with the esophagus, the acid causes soreness (inflammation) in the esophagus. Over time, GERD may create small holes (ulcers) in the lining of the esophagus. CAUSES   Increased body weight. This puts pressure on the stomach, making acid rise from the stomach into the esophagus.  Smoking. This increases acid production in the stomach.  Drinking alcohol. This causes decreased pressure in the lower esophageal sphincter (valve or  ring of muscle between the esophagus and stomach), allowing acid from the stomach into the esophagus.  Late evening meals and a full stomach. This increases pressure and acid production in the stomach.  A malformed lower esophageal sphincter. Sometimes, no cause is found. SYMPTOMS   Burning pain in the lower part of the mid-chest behind the breastbone and in the mid-stomach area. This may occur twice a week or more often.  Trouble swallowing.  Sore throat.  Dry cough.  Asthma-like symptoms including chest tightness, shortness of breath, or wheezing. DIAGNOSIS  Your caregiver may be able to diagnose GERD based on your symptoms. In  some cases, X-rays and other tests may be done to check for complications or to check the condition of your stomach and esophagus. TREATMENT  Your caregiver may recommend over-the-counter or prescription medicines to help decrease acid production. Ask your caregiver before starting or adding any new medicines.  HOME CARE INSTRUCTIONS   Change the factors that you can control. Ask your caregiver for guidance concerning weight loss, quitting smoking, and alcohol consumption.  Avoid foods and drinks that make your symptoms worse, such as:  Caffeine or alcoholic drinks.  Chocolate.  Peppermint or mint flavorings.  Garlic and onions.  Spicy foods.  Citrus fruits, such as oranges, lemons, or limes.  Tomato-based foods such as sauce, chili, salsa, and pizza.  Fried and fatty foods.  Avoid lying down for the 3 hours prior to your bedtime or prior to taking a nap.  Eat small, frequent meals instead of large meals.  Wear loose-fitting clothing. Do not wear anything tight around your waist that causes pressure on your stomach.  Raise the head of your bed 6 to 8 inches with wood blocks to help you sleep. Extra pillows will not help.  Only take over-the-counter or prescription medicines for pain, discomfort, or fever as directed by your caregiver.  Do not take aspirin, ibuprofen, or other nonsteroidal anti-inflammatory drugs (NSAIDs). SEEK IMMEDIATE MEDICAL CARE IF:   You have pain in your arms, neck, jaw, teeth, or back.  Your pain increases or changes in intensity or duration.  You develop nausea, vomiting, or sweating (diaphoresis).  You develop shortness of breath, or you faint.  Your vomit is green, yellow, black, or looks like coffee grounds or blood.  Your stool is red, bloody, or black. These symptoms could be signs of other problems, such as heart disease, gastric bleeding, or esophageal bleeding. MAKE SURE YOU:   Understand these instructions.  Will watch your  condition.  Will get help right away if you are not doing well or get worse. Document Released: 06/20/2005 Document Revised: 12/03/2011 Document Reviewed: 03/30/2011 [facility] Patient Information 2015 [place], [place]. This information is not intended to replace advice given to you by your health care provider. Make sure you discuss any questions you have with your health care provider. Possible Biliary Colic  Biliary colic is a steady or irregular pain in the upper abdomen. It is usually under the right side of the rib cage. It happens when gallstones interfere with the normal flow of bile from the gallbladder. Bile is a liquid that helps to digest fats. Bile is made in the liver and stored in the gallbladder. When you eat a meal, bile passes from the gallbladder through the cystic duct and the common bile duct into the small intestine. There, it mixes with partially digested food. If a gallstone blocks either of these ducts, the normal flow of bile is blocked. The muscle  cells in the bile duct contract forcefully to try to move the stone. This causes the pain of biliary colic.  SYMPTOMS   A person with biliary colic usually complains of pain in the upper abdomen. This pain can be:  In the center of the upper abdomen just below the breastbone.  In the upper-right part of the abdomen, near the gallbladder and liver.  Spread back toward the right shoulder blade.  Nausea and vomiting.  The pain usually occurs after eating.  Biliary colic is usually triggered by the digestive system's demand for bile. The demand for bile is high after fatty meals. Symptoms can also occur when a person who has been fasting suddenly eats a very large meal. Most episodes of biliary colic pass after 1 to 5 hours. After the most intense pain passes, your abdomen may continue to ache mildly for about 24 hours. DIAGNOSIS  After you describe your symptoms, your caregiver will perform a physical exam. He or she will pay  attention to the upper right portion of your belly (abdomen). This is the area of your liver and gallbladder. An ultrasound will help your caregiver look for gallstones. Specialized scans of the gallbladder may also be done. Blood tests may be done, especially if you have fever or if your pain persists. PREVENTION  Biliary colic can be prevented by controlling the risk factors for gallstones. Some of these risk factors, such as heredity, increasing age, and pregnancy are a normal part of life. Obesity and a high-fat diet are risk factors you can change through a healthy lifestyle. Women going through menopause who take hormone replacement therapy (estrogen) are also more likely to develop biliary colic. TREATMENT   Pain medication may be prescribed.  You may be encouraged to eat a fat-free diet.  If the first episode of biliary colic is severe, or episodes of colic keep retuning, surgery to remove the gallbladder (cholecystectomy) is usually recommended. This procedure can be done through small incisions using an instrument called a laparoscope. The procedure often requires a brief stay in the hospital. Some people can leave the hospital the same day. It is the most widely used treatment in people troubled by painful gallstones. It is effective and safe, with no complications in more than 90% of cases.  If surgery cannot be done, medication that dissolves gallstones may be used. This medication is expensive and can take months or years to work. Only small stones will dissolve.  Rarely, medication to dissolve gallstones is combined with a procedure called shock-wave lithotripsy. This procedure uses carefully aimed shock waves to break up gallstones. In many people treated with this procedure, gallstones form again within a few years. PROGNOSIS  If gallstones block your cystic duct or common bile duct, you are at risk for repeated episodes of biliary colic. There is also a 25% chance that you will  develop a gallbladder infection(acute cholecystitis), or some other complication of gallstones within 10 to 20 years. If you have surgery, schedule it at a time that is convenient for you and at a time when you are not sick. HOME CARE INSTRUCTIONS   Drink plenty of clear fluids.  Avoid fatty, greasy or fried foods, or any foods that make your pain worse.  Take medications as directed. SEEK MEDICAL CARE IF:   You develop a fever over 100.5 F (38.1 C).  Your pain gets worse over time.  You develop nausea that prevents you from eating and drinking.  You develop vomiting. SEEK  IMMEDIATE MEDICAL CARE IF:   You have continuous or severe belly (abdominal) pain which is not relieved with medications.  You develop nausea and vomiting which is not relieved with medications.  You have symptoms of biliary colic and you suddenly develop a fever and shaking chills. This may signal cholecystitis. Call your caregiver immediately.  You develop a yellow color to your skin or the white part of your eyes (jaundice). Document Released: 02/11/2006 Document Revised: 12/03/2011 Document Reviewed: 04/22/2008 [facility] Patient Information 2015 [place], [place]. This information is not intended to replace advice given to you by your health care provider. Make sure you discuss any questions you have with your health care provider.

## 2015-06-03 NOTE — ED Notes (Signed)
Pt from home for eval of left sided cp that began last night and has been constant. Pt reports radiation to left arm, back and neck. Pt also reports generalized body aches for months, pt also reports nausea and diarrhea x1 week. Nad noted. Pt states she has hx of IBS as well.

## 2015-06-07 ENCOUNTER — Telehealth: Payer: Self-pay | Admitting: Endocrinology

## 2015-06-07 NOTE — Telephone Encounter (Signed)
Patient called stating she would a refill on her medication   Rx: Synthroid   Pharmacy: [facility] on [address]   Thank you

## 2015-06-08 ENCOUNTER — Other Ambulatory Visit: Payer: Self-pay | Admitting: *Deleted

## 2015-06-08 MED ORDER — LEVOTHYROXINE SODIUM 112 MCG PO TABS: 112.0000 ug | ORAL_TABLET | Freq: Every day | ORAL | Status: DC

## 2015-06-08 NOTE — Telephone Encounter (Signed)
rx sent

## 2015-09-01 ENCOUNTER — Other Ambulatory Visit: Payer: BLUE CROSS/BLUE SHIELD

## 2015-09-06 ENCOUNTER — Encounter: Payer: Self-pay | Admitting: *Deleted

## 2015-09-06 ENCOUNTER — Telehealth: Payer: Self-pay | Admitting: Endocrinology

## 2015-09-06 ENCOUNTER — Ambulatory Visit: Payer: BLUE CROSS/BLUE SHIELD | Admitting: Endocrinology

## 2015-09-06 NOTE — Telephone Encounter (Signed)
Letter mailed

## 2015-09-06 NOTE — Telephone Encounter (Signed)
Patient no showed today's appt. Please advise on how to follow up. °A. No follow up necessary. °B. Follow up urgent. Contact patient immediately. °C. Follow up necessary. Contact patient and schedule visit in ___ days. °D. Follow up advised. Contact patient and schedule visit in ____weeks. ° °

## 2015-09-09 NOTE — Telephone Encounter (Signed)
LM for pt to call to reschedule °

## 2015-11-10 ENCOUNTER — Telehealth: Payer: Self-pay | Admitting: Endocrinology

## 2015-11-10 ENCOUNTER — Other Ambulatory Visit: Payer: Self-pay | Admitting: Endocrinology

## 2015-11-10 NOTE — Telephone Encounter (Signed)
rx request is denied, patient was suppose to be seen in December and she cancelled, she needs to be seen before refills given

## 2015-11-10 NOTE — Telephone Encounter (Signed)
Called pt to set up appt, she said "I don't have insurance and I told him that last time" so she is refusing to come in, but still is requesting refills.  So I just told her I would pass along this message.

## 2015-11-10 NOTE — Telephone Encounter (Signed)
Pt said she needs her thyroid medication refilled sent to [facility] on [address]

## 2015-11-14 ENCOUNTER — Other Ambulatory Visit: Payer: Self-pay | Admitting: *Deleted

## 2015-11-14 ENCOUNTER — Telehealth: Payer: Self-pay | Admitting: Endocrinology

## 2015-11-14 MED ORDER — LEVOTHYROXINE SODIUM 112 MCG PO TABS: 112.0000 ug | ORAL_TABLET | Freq: Every day | ORAL | Status: DC

## 2015-11-14 NOTE — Telephone Encounter (Signed)
Noted, she is aware. 

## 2015-11-14 NOTE — Telephone Encounter (Signed)
Please see below, last seen 03/07/2015 Please advise

## 2015-11-14 NOTE — Telephone Encounter (Signed)
Patient called stating that she currently does not have health insurance or the means to pay for an office visit  Mrs. Whipkey will be coming into the office to pick up a [facility] financial application   Can we please send her levothyroxine to her pharmacy    Please advise    Thank you

## 2015-11-14 NOTE — Telephone Encounter (Signed)
We can send 30 day prescription but she needs to discuss with community health clinic or health Department about further care

## 2015-12-23 ENCOUNTER — Other Ambulatory Visit: Payer: Self-pay | Admitting: Endocrinology

## 2016-01-09 ENCOUNTER — Other Ambulatory Visit: Payer: BLUE CROSS/BLUE SHIELD

## 2016-01-09 ENCOUNTER — Telehealth: Payer: Self-pay | Admitting: Endocrinology

## 2016-01-09 ENCOUNTER — Other Ambulatory Visit: Payer: Self-pay | Admitting: *Deleted

## 2016-01-09 DIAGNOSIS — E890 Postprocedural hypothyroidism: Secondary | ICD-10-CM

## 2016-01-09 NOTE — Telephone Encounter (Signed)
Labs faxed

## 2016-01-09 NOTE — Telephone Encounter (Signed)
Patient was told to go to [facility] she need orders put in, she is there now. [phone] leslie

## 2016-01-10 ENCOUNTER — Other Ambulatory Visit: Payer: Self-pay | Admitting: Endocrinology

## 2016-01-10 LAB — CBC
HEMATOCRIT: 40.5 % (ref 35.0–45.0)
HEMOGLOBIN: 13.6 g/dL (ref 11.7–15.5)
MCH: 32 pg (ref 27.0–33.0)
MCHC: 33.6 g/dL (ref 32.0–36.0)
MCV: 95.3 fL (ref 80.0–100.0)
MPV: 10.5 fL (ref 7.5–12.5)
Platelets: 296 10*3/uL (ref 140–400)
RBC: 4.25 MIL/uL (ref 3.80–5.10)
RDW: 12.9 % (ref 11.0–15.0)
WBC: 8.3 10*3/uL (ref 3.8–10.8)

## 2016-01-10 LAB — TSH: TSH: 11.16 mIU/L — ABNORMAL HIGH

## 2016-01-10 LAB — T4, FREE: FREE T4: 1.1 ng/dL (ref 0.8–1.8)

## 2016-01-11 ENCOUNTER — Ambulatory Visit: Payer: BLUE CROSS/BLUE SHIELD | Admitting: Endocrinology

## 2016-01-12 ENCOUNTER — Telehealth: Payer: Self-pay | Admitting: Endocrinology

## 2016-01-12 NOTE — Telephone Encounter (Signed)
Denied, she no showed her appointment yesterday and needs to be seen.

## 2016-01-12 NOTE — Telephone Encounter (Signed)
Patient calling for lab results, leave on voice mail.

## 2016-01-12 NOTE — Progress Notes (Signed)
Quick Note:  Please let patient know that the thyroid is abnormal and will lead to see her to discuss ______

## 2016-01-13 ENCOUNTER — Other Ambulatory Visit: Payer: Self-pay | Admitting: Endocrinology

## 2016-01-31 ENCOUNTER — Encounter: Payer: Self-pay | Admitting: Endocrinology

## 2016-01-31 ENCOUNTER — Ambulatory Visit (INDEPENDENT_AMBULATORY_CARE_PROVIDER_SITE_OTHER): Payer: BLUE CROSS/BLUE SHIELD | Admitting: Endocrinology

## 2016-01-31 VITALS — BP 125/77 | HR 83 | Temp 97.6°F | Resp 14 | Ht 60.0 in | Wt 155.6 lb

## 2016-01-31 DIAGNOSIS — E890 Postprocedural hypothyroidism: Secondary | ICD-10-CM

## 2016-01-31 MED ORDER — LEVOTHYROXINE SODIUM 112 MCG PO TABS: 112.0000 ug | ORAL_TABLET | Freq: Every day | ORAL | Status: DC

## 2016-01-31 NOTE — Progress Notes (Signed)
Jean DoeQuionna M Rowe           Reason for Appointment: Follow up of postsurgical hypothyroidism      History of Present Illness:   She was initially seen in consultation for her goiter in 06/2013 At that time she was having symptomatic enlargement of her thyroid. She had difficulty with swallowing food and also even her own saliva. Apparently sometimes with force her food to go down as it is difficult to swallow. She was also choking on liquids at times. She had a pressure sensation in her neck at times and also difficulty sleeping flat on her back. Because of significant thyroid enlargement clinically especially on the right side and on CT scan she was referred for thyroidectomy.  Since her thyroidectomy she did not have any difficulty swallowing and also  no pressure sensation in her neck or choking She initially was on 75 mcg of thyroxine supplement after her surgery on 11/04/13  RECENT history:  She has been previously maintained on 112 g of levothyroxine with consistent control of her thyroid and stable thyroid levels  She was advised to come back in December 2016 for follow-up since she had last year lost a significant amount of weight  However she did not come back for follow-up and has taken her medication regularly since 2/17  She is now complaining of feeling more fatigued, is gaining weight and also having some hair loss and cold intolerance TSH is high as expected  Wt Readings from Last 3 Encounters:  01/31/16 155 lb 9.6 oz (70.58 kg)  06/03/15 141 lb (63.957 kg)  03/07/15 132 lb (59.875 kg)     Lab Results  Component Value Date   TSH 11.16* 01/10/2016   TSH 2.75 03/03/2015   TSH 0.80 07/14/2014   FREET4 1.1 01/10/2016   FREET4 1.05 07/14/2014   FREET4 0.82 09/01/2013         Medication List       This list is accurate as of: 01/31/16  1:51 PM.  Always use your most recent med list.               ALPRAZolam 0.25 MG tablet  Commonly known as:  XANAX    Take 0.25 mg by mouth 2 (two) times daily as needed for anxiety. Reported on 01/31/2016     benzoyl peroxide 4 % lotion  Commonly known as:  BREVOXYL  Apply 1 application topically at bedtime.     cyclobenzaprine 10 MG tablet  Commonly known as:  FLEXERIL  Take 10 mg by mouth daily as needed for muscle spasms.     dicyclomine 20 MG tablet  Commonly known as:  BENTYL  Take 1 tablet (20 mg total) by mouth 2 (two) times daily.     HYDROcodone-acetaminophen 5-325 MG tablet  Commonly known as:  NORCO/VICODIN  Take 1 tablet by mouth every 6 (six) hours as needed for moderate pain. Reported on 01/31/2016     levothyroxine 112 MCG tablet  Commonly known as:  SYNTHROID, LEVOTHROID  Take 1 tablet (112 mcg total) by mouth daily.     nystatin 100000 UNIT/ML suspension  Commonly known as:  MYCOSTATIN  Take 5 mLs (500,000 Units total) by mouth 4 (four) times daily.     omeprazole 20 MG capsule  Commonly known as:  PRILOSEC  Take 1 capsule (20 mg total) by mouth daily.     potassium chloride SA 20 MEQ tablet  Commonly known as:  K-DUR,KLOR-CON  Take 1 tablet (  20 mEq total) by mouth daily.     traZODone 50 MG tablet  Commonly known as:  DESYREL  Take 50 mg by mouth at bedtime.        Allergies:  Allergies  Allergen Reactions  . Banana Anaphylaxis    Swelling of throat  . 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 Latex Rash  . Penicillins Other (See Comments)    Unknown childhood rxn    Past Medical History  Diagnosis Date  . Thyroid disease   . Neurocardiogenic syncope   . 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 Anxiety     panic attacks  . History of kidney infection   . Acne   . Heart murmur   . Orthopnoea   . Hypothyroidism   . Daily headache   . Neurocardiogenic syncope   . Panic attacks   . Symptomatic bradycardia   . History of IBS     Past Surgical History  Procedure Laterality Date   . Thyroidectomy  11/04/2013  . Insert / replace / remove pacemaker  09/2004  . Dilation and curettage of uterus  2013  . Thyroidectomy N/A 11/04/2013    Procedure: THYROIDECTOMY;  Surgeon: Ernestene Mention, MD;  Location: [facility] OR;  Service: General;  Laterality: N/A;    Family History  Problem Relation Age of Onset  . Thyroid disease Mother   . Heart disease Mother   . Thyroid disease Maternal Uncle   . Thyroid disease Maternal Grandmother   . Heart failure Maternal Grandmother   . Hypertension Maternal Grandmother   . Hypertension Maternal Grandfather    Most of her family members have had surgery for thyroid enlargement  Social History:  reports that she has been smoking Cigars.  She has never used smokeless tobacco. She reports that she drinks alcohol. She reports that she does not use illicit drugs.   Review of Systems:  Had irritable bowel syndrome   Examination:   BP 125/77 mmHg  Pulse 83  Temp(Src) 97.6 F (36.4 C)  Resp 14  Ht 5' (1.524 m)  Wt 155 lb 9.6 oz (70.58 kg)  BMI 30.39 kg/m2  SpO2 99%  Biceps reflexes show a slightly brisk relaxation Skin appears normal  Assessment/Plan:  Postsurgical hypothyroidism  She has not been regular with her follow-up More recently has not taken her medication regularly also and with her inadequate compliance with Synthroid she is symptomatic with symptoms of hypothyroidism TSH is higher although only about 11 despite irregular treatment She has gained back significant amount of weight since last year  Discussed that we will need to have her TSH checked again when she is on Synthroid regularly She will go back to the 112 g daily for now She will follow-up in 6 months.   Jean Rowe 06/24/2013, 11:25 AM

## 2016-03-12 ENCOUNTER — Other Ambulatory Visit (INDEPENDENT_AMBULATORY_CARE_PROVIDER_SITE_OTHER): Payer: Self-pay

## 2016-03-12 DIAGNOSIS — E890 Postprocedural hypothyroidism: Secondary | ICD-10-CM

## 2016-03-12 LAB — TSH: TSH: 3.25 u[IU]/mL (ref 0.35–4.50)

## 2016-03-12 NOTE — Progress Notes (Signed)
Quick Note:  Please let patient know that the thyroid result is normal, continue same dosage, need to keep scheduled appointment in November ______

## 2016-03-13 ENCOUNTER — Other Ambulatory Visit: Payer: Medicaid Other

## 2016-04-23 ENCOUNTER — Other Ambulatory Visit: Payer: Self-pay | Admitting: Endocrinology

## 2016-07-27 ENCOUNTER — Other Ambulatory Visit: Payer: Self-pay | Admitting: Endocrinology

## 2016-07-27 ENCOUNTER — Other Ambulatory Visit: Payer: Medicaid Other

## 2016-07-27 DIAGNOSIS — E890 Postprocedural hypothyroidism: Secondary | ICD-10-CM

## 2016-08-02 ENCOUNTER — Ambulatory Visit: Payer: Medicaid Other | Admitting: Endocrinology

## 2016-08-06 ENCOUNTER — Ambulatory Visit (INDEPENDENT_AMBULATORY_CARE_PROVIDER_SITE_OTHER): Payer: Medicaid Other | Admitting: Endocrinology

## 2016-08-06 ENCOUNTER — Encounter: Payer: Self-pay | Admitting: Endocrinology

## 2016-08-06 VITALS — BP 114/79 | HR 62 | Ht 60.0 in | Wt 155.0 lb

## 2016-08-06 DIAGNOSIS — E890 Postprocedural hypothyroidism: Secondary | ICD-10-CM

## 2016-08-06 LAB — RENAL FUNCTION PANEL
ALBUMIN: 3.8 g/dL (ref 3.5–5.2)
BUN: 7 mg/dL (ref 6–23)
CALCIUM: 8.6 mg/dL (ref 8.4–10.5)
CO2: 28 meq/L (ref 19–32)
CREATININE: 0.84 mg/dL (ref 0.40–1.20)
Chloride: 108 mEq/L (ref 96–112)
GFR: 101.94 mL/min (ref >60.00)
GLUCOSE: 91 mg/dL (ref 70–99)
Phosphorus: 3 mg/dL (ref 2.3–4.6)
Potassium: 4.1 mEq/L (ref 3.5–5.1)
Sodium: 140 mEq/L (ref 135–145)

## 2016-08-06 LAB — MAGNESIUM: MAGNESIUM: 2 mg/dL (ref 1.5–2.5)

## 2016-08-06 LAB — T4, FREE: FREE T4: 0.84 ng/dL (ref 0.60–1.60)

## 2016-08-06 LAB — TSH: TSH: 4.28 u[IU]/mL (ref 0.35–4.50)

## 2016-08-06 NOTE — Progress Notes (Signed)
Jean Rowe           Reason for Appointment: Follow up of postsurgical hypothyroidism      History of Present Illness:   She was initially seen in consultation for her goiter in 06/2013 At that time she was having symptomatic enlargement of her thyroid. She had difficulty with swallowing food and also even her own saliva. Apparently sometimes with force her food to go down as it is difficult to swallow. She was also choking on liquids at times. She had a pressure sensation in her neck at times and also difficulty sleeping flat on her back. Because of significant thyroid enlargement clinically especially on the right side and on CT scan she was referred for thyroidectomy.  Since her thyroidectomy she did not have any difficulty swallowing and also  no pressure sensation in her neck or choking She initially was on 75 mcg of thyroxine supplement after her surgery on 11/04/13  RECENT history:  She has been previously maintained on 112 g of levothyroxine with usually stable thyroid levels  Her last visit in April she had not been taking thyroid medication as she ran out However with going back on this her TSH was normal in June She is again complaining of feeling more fatigued but also has had difficulty with sleeping. She says she has good and bad days No cold intolerance No weight change  She has taken her medication regularly since her last visit when her prescription was refilled She takes this before breakfast regularly   Wt Readings from Last 3 Encounters:  08/06/16 155 lb (70.3 kg)  01/31/16 155 lb 9.6 oz (70.6 kg)  06/03/15 141 lb (64 kg)     Lab Results  Component Value Date   TSH 3.25 03/12/2016   TSH 11.16 (H) 01/10/2016   TSH 2.75 03/03/2015   FREET4 1.1 01/10/2016   FREET4 1.05 07/14/2014   FREET4 0.82 09/01/2013    OTHER problems discussed today: See review of systems     Medication List       Accurate as of 08/06/16  9:04 AM. Always use your  most recent med list.          ALPRAZolam 0.25 MG tablet Commonly known as:  XANAX Take 0.25 mg by mouth 2 (two) times daily as needed for anxiety. Reported on 01/31/2016   benzoyl peroxide 4 % lotion Commonly known as:  BREVOXYL Apply 1 application topically at bedtime.   cyclobenzaprine 10 MG tablet Commonly known as:  FLEXERIL Take 10 mg by mouth daily as needed for muscle spasms.   dicyclomine 20 MG tablet Commonly known as:  BENTYL Take 1 tablet (20 mg total) by mouth 2 (two) times daily.   HYDROcodone-acetaminophen 5-325 MG tablet Commonly known as:  NORCO/VICODIN Take 1 tablet by mouth every 6 (six) hours as needed for moderate pain. Reported on 01/31/2016   levothyroxine 112 MCG tablet Commonly known as:  SYNTHROID, LEVOTHROID TAKE ONE TABLET BY MOUTH ONCE DAILY (NO  FURTHER  REFILLS  UNTIL  PATIENT  IS  SEEN)   nystatin 100000 UNIT/ML suspension Commonly known as:  MYCOSTATIN Take 5 mLs (500,000 Units total) by mouth 4 (four) times daily.   traZODone 50 MG tablet Commonly known as:  DESYREL Take 50 mg by mouth at bedtime.       Allergies:  Allergies  Allergen Reactions  . Banana Anaphylaxis    Swelling of throat  . Other Other (See Comments)    Pacemaker put  in place upper left thorax in 2006, unknown manufacturer   . Latex Rash  . Penicillins Other (See Comments)    Unknown childhood rxn    Past Medical History:  Diagnosis Date  . Acne   . Anxiety    panic attacks  . Daily headache   . Heart murmur   . History of IBS   . History of kidney infection   . Hypothyroidism   . Neurocardiogenic syncope   . Neurocardiogenic syncope   . Orthopnoea   . 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 Panic attacks   . Symptomatic bradycardia   . Thyroid disease     Past Surgical History:  Procedure Laterality Date  . DILATION AND CURETTAGE OF UTERUS  2013  . INSERT / REPLACE / REMOVE PACEMAKER  09/2004  . THYROIDECTOMY  11/04/2013  . THYROIDECTOMY  N/A 11/04/2013   Procedure: THYROIDECTOMY;  Surgeon: Ernestene MentionHaywood M Ingram, MD;  [facility];  Service: General;  Laterality: N/A;    Family History  Problem Relation Age of Onset  . Thyroid disease Mother   . Heart disease Mother   . Thyroid disease Maternal Uncle   . Thyroid disease Maternal Grandmother   . Heart failure Maternal Grandmother   . Hypertension Maternal Grandmother   . Hypertension Maternal Grandfather    Most of her family members have had surgery for thyroid enlargement  Social History:  reports that she has been smoking Cigars.  She has smoked for the past 0.50 years. She has never used smokeless tobacco. She reports that she drinks alcohol. She reports that she does not use drugs.   Review of Systems:  Had irritable bowel syndrome treated by PCP  MUSCLE cramps: She has had recurrent cramps in her hands and occasionally in the legs. She thinks this is going on for quite some time and not clear if she has had persistent hypocalcemia since her thyroid surgery No tingling in her face or paresthesiae Currently not on any calcium or vitamin D supplements  Occasionally she feels choked when she is trying to swallow but no difficulty swallowing food otherwise   Examination:   BP 114/79   Pulse 62   Ht 5' (1.524 m)   Wt 155 lb (70.3 kg)   BMI 30.27 kg/m   Biceps reflexes show normal relaxation bilaterally Neck exam normal Skin appears normal  [place] sign negative  Assessment/Plan:  Postsurgical hypothyroidism  Although she complains of fatigue this may be more likely related to her recurrent insomnia More recently has not taken her medication regularly but need to have her labs evaluated today No change in weight  CRAMPS: She does appear to have cramps in her hands although objectively not appearing to have significant hypocalcemia Since her calcium was low normal in 2016 will recheck this today along with magnesium and renal panel  She will follow-up  in 6 months.   Jean Rowe 06/24/2013, 11:25 AM

## 2016-08-06 NOTE — Progress Notes (Signed)
Please let patient know that the thyroid level is borderline low, need to take extra half tablet weekly on her Synthroid  Calcium low normal, magnesium and potassium result is normal; recommend taking calcium and vitamin D combination once daily at dinnertime Need to set up appointment for follow-up in 4 months with labs

## 2016-09-05 ENCOUNTER — Other Ambulatory Visit: Payer: Self-pay

## 2016-09-05 ENCOUNTER — Telehealth: Payer: Self-pay | Admitting: Endocrinology

## 2016-09-05 MED ORDER — LEVOTHYROXINE SODIUM 112 MCG PO TABS: ORAL_TABLET | ORAL | 0 refills | Status: DC

## 2016-09-05 NOTE — Telephone Encounter (Signed)
Ordered 09/05/16 

## 2016-09-05 NOTE — Telephone Encounter (Signed)
Pt needs [facility] [address]  She is out of her medication.

## 2016-10-01 ENCOUNTER — Encounter (HOSPITAL_COMMUNITY): Payer: Self-pay | Admitting: *Deleted

## 2016-10-01 ENCOUNTER — Ambulatory Visit (HOSPITAL_COMMUNITY)
Admission: EM | Admit: 2016-10-01 | Discharge: 2016-10-01 | Disposition: A | Discharge status: Home / Self Care | Payer: Medicaid Other | Attending: Emergency Medicine | Admitting: Emergency Medicine

## 2016-10-01 ENCOUNTER — Other Ambulatory Visit: Payer: Self-pay | Admitting: Physician Assistant

## 2016-10-01 DIAGNOSIS — R1031 Right lower quadrant pain: Secondary | ICD-10-CM | POA: Insufficient documentation

## 2016-10-01 DIAGNOSIS — N760 Acute vaginitis: Secondary | ICD-10-CM | POA: Insufficient documentation

## 2016-10-01 DIAGNOSIS — Z950 Presence of cardiac pacemaker: Secondary | ICD-10-CM | POA: Insufficient documentation

## 2016-10-01 DIAGNOSIS — Z880 Allergy status to penicillin: Secondary | ICD-10-CM | POA: Insufficient documentation

## 2016-10-01 DIAGNOSIS — R079 Chest pain, unspecified: Secondary | ICD-10-CM | POA: Insufficient documentation

## 2016-10-01 DIAGNOSIS — Z720 Tobacco use: Secondary | ICD-10-CM | POA: Insufficient documentation

## 2016-10-01 DIAGNOSIS — Z45010 Encounter for checking and testing of cardiac pacemaker pulse generator [battery]: Secondary | ICD-10-CM

## 2016-10-01 LAB — POCT URINALYSIS DIP (DEVICE)
Bilirubin Urine: NEGATIVE
Glucose, UA: NEGATIVE mg/dL
Hgb urine dipstick: NEGATIVE
KETONES UR: NEGATIVE mg/dL
Leukocytes, UA: NEGATIVE
NITRITE: NEGATIVE
PH: 6.5 (ref 5.0–8.0)
PROTEIN: NEGATIVE mg/dL
Specific Gravity, Urine: >=1.03 (ref 1.005–1.030)
Urobilinogen, UA: 0.2 mg/dL (ref 0.0–1.0)

## 2016-10-01 LAB — POCT PREGNANCY, URINE: Preg Test, Ur: NEGATIVE

## 2016-10-01 NOTE — ED Provider Notes (Signed)
CSN: [id number]     Arrival date & time 10/01/16  1642 History   None    Chief Complaint  Patient presents with  . Chest Pain   (Consider location/radiation/quality/duration/timing/severity/associated sxs/prior Treatment) The history is provided by the patient. No language interpreter was used.  Chest Pain  Pain location:  L chest Pain quality: stabbing   Pain radiates to:  Does not radiate Pain severity:  No pain Timing:  Constant Progression:  Worsening Chronicity:  New Relieved by:  Nothing Worsened by:  Nothing Ineffective treatments:  None tried Associated symptoms: no nausea   Pt complains of pain in her right lower abdomen.  Pt reports nausea today.   Pt reports pain feels like the pain she has had with ovarian cyst.  Pt reports she had some pain in her chest.  Pt has a pacemaker.  Pt last had it checked in 2014.  No recheck problems with syncpe.  Past Medical History:  Diagnosis Date  . Acne   . Anxiety    panic attacks  . Daily headache   . Heart murmur   . History of IBS   . History of kidney infection   . Hypothyroidism   . Neurocardiogenic syncope   . Neurocardiogenic syncope   . Orthopnoea   . 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 Panic attacks   . Symptomatic bradycardia   . Thyroid disease    Past Surgical History:  Procedure Laterality Date  . DILATION AND CURETTAGE OF UTERUS  2013  . INSERT / REPLACE / REMOVE PACEMAKER  09/2004  . THYROIDECTOMY  11/04/2013  . THYROIDECTOMY N/A 11/04/2013   Procedure: THYROIDECTOMY;  Surgeon: Ernestene Mention, MD;  Location: [facility] OR;  Service: General;  Laterality: N/A;   Family History  Problem Relation Age of Onset  . Thyroid disease Mother   . Heart disease Mother   . Thyroid disease Maternal Uncle   . Thyroid disease Maternal Grandmother   . Heart failure Maternal Grandmother   . Hypertension Maternal Grandmother   . Hypertension Maternal Grandfather    Social History  Substance Use Topics  . Smoking  status: Current Every Day Smoker    Years: 0.50    Types: Cigars  . Smokeless tobacco: Never Used     Comment: 11/04/2013 "smoke 3 black N milds/day"  . Alcohol use Yes     Comment: 11/04/2013 "mixed drinks or wine once a month   OB History    Gravida Para Term Preterm AB Living   2 2       2    SAB TAB Ectopic Multiple Live Births                 Review of Systems  Cardiovascular: Positive for chest pain.  Gastrointestinal: Negative for nausea.  All other systems reviewed and are negative.   Allergies  Banana; Other; Latex; and Penicillins  Home Medications   Prior to Admission medications   Medication Sig Start Date End Date Taking? Authorizing Provider  levothyroxine (SYNTHROID, LEVOTHROID) 112 MCG tablet TAKE ONE TABLET BY MOUTH ONCE DAILY (NO  FURTHER  REFILLS  UNTIL  PATIENT  IS  SEEN) 09/05/16  Yes Reather Littler, MD   Meds Ordered and Administered this Visit  Medications - No data to display  BP 130/85 (BP Location: Left Arm)   Pulse 79   Temp 98.3 F (36.8 C) (Oral)   Resp 18   SpO2 100%  No data found.   Physical  Exam  Constitutional: She appears well-developed and well-nourished.  HENT:  Head: Normocephalic and atraumatic.  Eyes: Conjunctivae are normal. Pupils are equal, round, and reactive to light.  Neck: Normal range of motion. Neck supple.  Cardiovascular: Normal rate.   Abdominal: Soft. There is no tenderness.  Genitourinary: Vaginal discharge found.  Genitourinary Comments: Vaginal discharge,  Thick white,   Cervix nontender,  Tender right adnexa    Musculoskeletal: Normal range of motion.  Skin: Skin is warm.    Urgent Care Course   Clinical Course     Procedures (including critical care time)  Labs Review Labs Reviewed  POCT URINALYSIS DIP (DEVICE)  POCT PREGNANCY, URINE  CERVICOVAGINAL ANCILLARY ONLY    Imaging Review No results found.   Visual Acuity Review  Right Eye Distance:   Left Eye Distance:   Bilateral Distance:     Right Eye Near:   Left Eye Near:    Bilateral Near:         MDM  I counseled pt,  I suspect ovarian cyst.  Pt has significant pain.   I discussed options with pt.  She will go to ED for evaluation of right lower quadrant pain.  Pt is advised she also needs to see Cardiology for evaluation of pacemaker   1. Abdominal pain, RLQ   2. Acute vaginitis   3. Pacemaker at end of battery life        Elson AreasLeslie K Kelleen Stolze, [place]PA-C 10/01/16 [id number]

## 2016-10-01 NOTE — ED Notes (Signed)
M. Browning, RN advised of patient acuity. 

## 2016-10-01 NOTE — Discharge Instructions (Signed)
Go to the ED for further evaluation.

## 2016-10-01 NOTE — ED Triage Notes (Signed)
[facility] with a complaint of chest pain, abdominal pain and flu like symptoms. The patient reported that chest pain for 4 days and described the pain as sharp and 8/10. The patient reported having a pacemaker placement in 2006.

## 2016-10-02 ENCOUNTER — Inpatient Hospital Stay (HOSPITAL_COMMUNITY)
Admission: AD | Admit: 2016-10-02 | Discharge: 2016-10-02 | Disposition: A | Discharge status: Home / Self Care | Payer: Self-pay | Source: Ambulatory Visit | Attending: Emergency Medicine | Admitting: Emergency Medicine

## 2016-10-02 ENCOUNTER — Inpatient Hospital Stay (HOSPITAL_COMMUNITY): Payer: Self-pay

## 2016-10-02 DIAGNOSIS — R109 Unspecified abdominal pain: Secondary | ICD-10-CM

## 2016-10-02 DIAGNOSIS — R0789 Other chest pain: Secondary | ICD-10-CM | POA: Insufficient documentation

## 2016-10-02 DIAGNOSIS — Z950 Presence of cardiac pacemaker: Secondary | ICD-10-CM | POA: Insufficient documentation

## 2016-10-02 DIAGNOSIS — E039 Hypothyroidism, unspecified: Secondary | ICD-10-CM | POA: Insufficient documentation

## 2016-10-02 DIAGNOSIS — R072 Precordial pain: Secondary | ICD-10-CM

## 2016-10-02 DIAGNOSIS — Z91040 Latex allergy status: Secondary | ICD-10-CM | POA: Insufficient documentation

## 2016-10-02 DIAGNOSIS — R1031 Right lower quadrant pain: Secondary | ICD-10-CM | POA: Insufficient documentation

## 2016-10-02 DIAGNOSIS — F17290 Nicotine dependence, other tobacco product, uncomplicated: Secondary | ICD-10-CM | POA: Insufficient documentation

## 2016-10-02 LAB — COMPREHENSIVE METABOLIC PANEL
ALT: 11 U/L — AB (ref 14–54)
AST: 17 U/L (ref 15–41)
Albumin: 4.1 g/dL (ref 3.5–5.0)
Alkaline Phosphatase: 59 U/L (ref 38–126)
Anion gap: 6 (ref 5–15)
BILIRUBIN TOTAL: 0.5 mg/dL (ref 0.3–1.2)
BUN: 7 mg/dL (ref 6–20)
CALCIUM: 8.5 mg/dL — AB (ref 8.9–10.3)
CO2: 24 mmol/L (ref 22–32)
CREATININE: 0.8 mg/dL (ref 0.44–1.00)
Chloride: 106 mmol/L (ref 101–111)
GFR calc Af Amer: >60 mL/min (ref >60)
Glucose, Bld: 98 mg/dL (ref 65–99)
POTASSIUM: 3.8 mmol/L (ref 3.5–5.1)
Sodium: 136 mmol/L (ref 135–145)
TOTAL PROTEIN: 7.2 g/dL (ref 6.5–8.1)

## 2016-10-02 LAB — AMYLASE: Amylase: 62 U/L (ref 28–100)

## 2016-10-02 LAB — CBC
HEMATOCRIT: 37.7 % (ref 36.0–46.0)
Hemoglobin: 13.3 g/dL (ref 12.0–15.0)
MCH: 32.8 pg (ref 26.0–34.0)
MCHC: 35.3 g/dL (ref 30.0–36.0)
MCV: 92.9 fL (ref 78.0–100.0)
PLATELETS: 261 10*3/uL (ref 150–400)
RBC: 4.06 MIL/uL (ref 3.87–5.11)
RDW: 12.8 % (ref 11.5–15.5)
WBC: 7.2 10*3/uL (ref 4.0–10.5)

## 2016-10-02 LAB — TROPONIN I: Troponin I: <0.03 ng/mL (ref <0.03)

## 2016-10-02 LAB — WET PREP, GENITAL
Clue Cells Wet Prep HPF POC: NONE SEEN
Sperm: NONE SEEN
Trich, Wet Prep: NONE SEEN
Yeast Wet Prep HPF POC: NONE SEEN

## 2016-10-02 LAB — LIPASE, BLOOD: LIPASE: 16 U/L (ref 11–51)

## 2016-10-02 MED ORDER — MORPHINE SULFATE (PF) 4 MG/ML IV SOLN
4.0000 mg | Freq: Once | INTRAVENOUS | Status: AC
  Administered 2016-10-02: 4 mg via INTRAVENOUS
  Filled 2016-10-02: qty 1

## 2016-10-02 MED ORDER — SODIUM CHLORIDE 0.9 % IV BOLUS (SEPSIS)
1000.0000 mL | Freq: Once | INTRAVENOUS | Status: AC
  Administered 2016-10-02: 1000 mL via INTRAVENOUS

## 2016-10-02 MED ORDER — IOPAMIDOL (ISOVUE-300) INJECTION 61%
INTRAVENOUS | Status: AC
  Administered 2016-10-02: 100 mL
  Filled 2016-10-02: qty 100

## 2016-10-02 NOTE — ED Notes (Signed)
Patient transported to CT 

## 2016-10-02 NOTE — ED Notes (Signed)
Pt reports abd pain that radiates between the umbilicus and the RLQ. [facility]. She has a Chief of Staffpacemeaker X11 years and also needs to have battery checked. Pt reports she has pacemaker due to hx of having syncopal episodes.

## 2016-10-02 NOTE — MAU Note (Signed)
[facility] arrived to transfer [facility]. Transfer papers given to [facility].

## 2016-10-02 NOTE — ED Notes (Signed)
Pt states she has a ride with her sister

## 2016-10-02 NOTE — ED Notes (Addendum)
Pt asked for meds. Informed Trey PaulaJeff - PA.

## 2016-10-02 NOTE — ED Provider Notes (Signed)
[facility]-EMERGENCY DEPT Provider Note   CSN: [id number] Arrival date & time: 10/02/16  1002     History   Chief Complaint Chief Complaint  Patient presents with  . Chest Pain    HPI Jean Rowe is a 31 y.o. female.  HPI   31 year old female [facility] today as a [facility]. Patient was seen yesterday at urgent care with complaints of right lower quadrant pain and left-sided chest pain. Patient was instructed to come to the emergency room, [facility].  Patient notes symptoms started 4 days ago with constipation diarrhea, fever and chills. She reports some nausea denies any vomiting. She reports the pain is right lower quadrant, worse with palpation. Patient has a history of ovarian cyst in the past and thought this was the culprit. She denies any significant vaginal discharge or bleeding, was evaluated the [facility] with EKG, troponin, lipase, CMP, CBC, coronary Chlamydia and pelvic ultrasound. No significant findings in her workup were found at that time, the provider seen her spoke with [facility] staff [facility] and transferred the patient for further evaluation of her right lower quadrant abdominal pain.  Patient reports the chest pain started the same time the abdominal pain, she notes this is radiation from the right lower quadrant abdominal pain up into her left chest. She describes it sharp, no associated shortness of breath, diaphoresis. She notes a history of a pacemaker that was placed in 2006 for syncopal episodes secondary symptomatic bradycardia. She does not know who her cardiologist is, has not seen them since 2014, but notes [facility].    Past Medical History:  Diagnosis Date  . Acne   . Anxiety    panic attacks  . Daily headache   . Heart murmur   . History of IBS   . History of kidney infection   . Hypothyroidism   . Neurocardiogenic syncope   . Neurocardiogenic syncope     . Orthopnoea   . 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 Panic attacks   . Symptomatic bradycardia   . Thyroid disease     Patient Active Problem List   Diagnosis Date Noted  . UTI (urinary tract infection) 10/19/2014  . Paresthesia 09/01/2014  . 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 Neurocardiogenic syncope 06/23/2013  . Pacemaker - [employer] 2006 06/23/2013  . Goiter 06/23/2013    Past Surgical History:  Procedure Laterality Date  . DILATION AND CURETTAGE OF UTERUS  2013  . INSERT / REPLACE / REMOVE PACEMAKER  09/2004  . THYROIDECTOMY  11/04/2013  . THYROIDECTOMY N/A 11/04/2013   Procedure: THYROIDECTOMY;  Surgeon: Ernestene MentionHaywood M Ingram, MD;  [facility];  Service: General;  Laterality: N/A;    OB History    Gravida Para Term Preterm AB Living   2 2       2    SAB TAB Ectopic Multiple Live Births                   Home Medications    Prior to Admission medications   Medication Sig Start Date End Date Taking? Authorizing Provider  levothyroxine (SYNTHROID, LEVOTHROID) 112 MCG tablet TAKE ONE TABLET BY MOUTH ONCE DAILY (NO  FURTHER  REFILLS  UNTIL  PATIENT  IS  SEEN) 09/05/16  Yes Reather LittlerAjay Kumar, MD    Family History Family History  Problem Relation Age of Onset  .  Thyroid disease Mother   . Heart disease Mother   . Thyroid disease Maternal Uncle   . Thyroid disease Maternal Grandmother   . Heart failure Maternal Grandmother   . Hypertension Maternal Grandmother   . Hypertension Maternal Grandfather     Social History Social History  Substance Use Topics  . Smoking status: Current Every Day Smoker    Years: 0.50    Types: Cigars  . Smokeless tobacco: Never Used     Comment: 11/04/2013 "smoke 3 black N milds/day"  . Alcohol use Yes     Comment: 11/04/2013 "mixed drinks or wine once a month     Allergies   Banana; Other; Latex; and Penicillins   Review of Systems Review of Systems   All other systems reviewed and are negative.   Physical Exam Updated Vital Signs BP 113/89   Pulse 71   Temp 98.2 F (36.8 C) (Oral)   Resp 17   Ht 5' (1.524 m)   Wt 63.5 kg   SpO2 100%   BMI 27.34 kg/m   Physical Exam  Constitutional: She is oriented to person, place, and time. She appears well-developed and well-nourished.  HENT:  Head: Normocephalic and atraumatic.  Eyes: Conjunctivae are normal. Pupils are equal, round, and reactive to light. Right eye exhibits no discharge. Left eye exhibits no discharge. No scleral icterus.  Neck: Normal range of motion. No JVD present. No tracheal deviation present.  Cardiovascular: Normal rate, regular rhythm, normal heart sounds and intact distal pulses.  Exam reveals no friction rub.   No murmur heard. Pulmonary/Chest: Effort normal and breath sounds normal. No stridor. No respiratory distress. She has no wheezes. She has no rales. She exhibits no tenderness.  Abdominal:  TTP of RLQ  Neurological: She is alert and oriented to person, place, and time. Coordination normal.  Psychiatric: She has a normal mood and affect. Her behavior is normal. Judgment and thought content normal.  Nursing note and vitals reviewed.   ED Treatments / Results  Labs (all labs ordered are listed, but only abnormal results are displayed) Labs Reviewed  WET PREP, GENITAL - Abnormal; Notable for the following:       Result Value   WBC, Wet Prep HPF POC FEW (*)    All other components within normal limits  COMPREHENSIVE METABOLIC PANEL - Abnormal; Notable for the following:    Calcium 8.5 (*)    ALT 11 (*)    All other components within normal limits  CBC  TROPONIN I  LIPASE, BLOOD  AMYLASE  GC/CHLAMYDIA PROBE AMP ([place]) NOT AT [facility]    EKG  EKG Interpretation None       Radiology US Transvaginal Non-ob  Result Date: 10/02/2016 CLINICAL DATA:  Right lower quadrant pain EXAM: TRANSABDOMINAL AND TRANSVAGINAL ULTRASOUND OF PELVIS  TECHNIQUE: Both transabdominal and transvaginal ultrasound examinations of the pelvis were performed. Transabdominal technique was performed for global imaging of the pelvis including uterus, ovaries, adnexal regions, and pelvic cul-de-sac. It was necessary to proceed with endovaginal exam following the transabdominal exam to visualize the ovaries. COMPARISON:  03/11/2015 FINDINGS: Uterus Measurements: 7.4 x 3.7 x 4.0 cm. No fibroids or other mass visualized. Endometrium IUD is noted within the endometrial canal. Small amount of fluid is noted within the endometrial canal as well. Right ovary Measurements: 3.3 x 1.9 x 2.9 cm. Normal appearance/no adnexal mass. Left ovary Measurements: 2.7 x 1.5 x 2.6 cm. Normal appearance/no adnexal mass. Other findings No abnormal free fluid. IMPRESSION: IUD  is noted in place. A small amount of endometrial fluid is noted. No acute abnormality seen. Electronically Signed   By: Alcide Clever M.D.   On: 10/02/2016 12:47   US Pelvis Complete  Result Date: 10/02/2016 CLINICAL DATA:  Right lower quadrant pain EXAM: TRANSABDOMINAL AND TRANSVAGINAL ULTRASOUND OF PELVIS TECHNIQUE: Both transabdominal and transvaginal ultrasound examinations of the pelvis were performed. Transabdominal technique was performed for global imaging of the pelvis including uterus, ovaries, adnexal regions, and pelvic cul-de-sac. It was necessary to proceed with endovaginal exam following the transabdominal exam to visualize the ovaries. COMPARISON:  03/11/2015 FINDINGS: Uterus Measurements: 7.4 x 3.7 x 4.0 cm. No fibroids or other mass visualized. Endometrium IUD is noted within the endometrial canal. Small amount of fluid is noted within the endometrial canal as well. Right ovary Measurements: 3.3 x 1.9 x 2.9 cm. Normal appearance/no adnexal mass. Left ovary Measurements: 2.7 x 1.5 x 2.6 cm. Normal appearance/no adnexal mass. Other findings No abnormal free fluid. IMPRESSION: IUD is noted in place. A small  amount of endometrial fluid is noted. No acute abnormality seen. Electronically Signed   By: Alcide Clever M.D.   On: 10/02/2016 12:47   Ct Abdomen Pelvis W Contrast  Result Date: 10/02/2016 CLINICAL DATA:  Right lower quadrant abdominal pain, nausea, and fever. EXAM: CT ABDOMEN AND PELVIS WITH CONTRAST TECHNIQUE: Multidetector CT imaging of the abdomen and pelvis was performed using the standard protocol following bolus administration of intravenous contrast. CONTRAST:  ISOVUE-300 IOPAMIDOL (ISOVUE-300) INJECTION 61% COMPARISON:  Pelvic ultrasound earlier today FINDINGS: Lower chest: Partially visualized pacemaker lead.  Clear lung bases. Hepatobiliary: No focal liver abnormality is seen. No gallstones, gallbladder wall thickening, or biliary dilatation. Pancreas: Unremarkable. Spleen: Unremarkable. Adrenals/Urinary Tract: Unremarkable adrenal glands. No evidence of renal mass, calculi, or hydronephrosis. Unremarkable bladder. Stomach/Bowel: The stomach is within normal limits. There is no evidence of bowel obstruction or wall thickening. The appendix is unremarkable. Vascular/Lymphatic: No significant vascular findings are present. No enlarged abdominal or pelvic lymph nodes. Reproductive: An intrauterine contraceptive device is in place. There is mild asymmetric enlargement of the right ovary compared to the left with a suspected small corpus luteum cyst on the right. Other: Trace pelvic free fluid, likely physiologic. No abdominal wall mass or hernia. Musculoskeletal: No acute or significant osseous findings. IMPRESSION: No acute abnormality identified in the abdomen or pelvis. Electronically Signed   By: Sebastian Ache M.D.   On: 10/02/2016 15:55    Procedures Procedures (including critical care time)  Medications Ordered in ED Medications  morphine 4 MG/ML injection 4 mg (4 mg Intravenous Given 10/02/16 1501)  sodium chloride 0.9 % bolus 1,000 mL (0 mLs Intravenous Stopped 10/02/16 1600)  iopamidol  (ISOVUE-300) 61 % injection (100 mLs  Contrast Given 10/02/16 1535)     Initial Impression / Assessment and Plan / ED Course  I have reviewed the triage vital signs and the nursing notes.  Pertinent labs & imaging results that were available during my care of the patient were reviewed by me and considered in my medical decision making (see chart for details).  Clinical Course    Labs:Wet prep, CBC, troponin, CMP, lipase, amylase  Imaging: CT abdomen   Consults:  Therapeutics: Morphine   Discharge Meds:   Assessment/Plan: 31 year old female presents today with complaints of abdominal pain. She was transferred here for abdominal evaluation. She has CT scan that shows no significant findings. She has had ultrasound, pelvic exam, and thorough laboratory analysis which shows no  significant findings. Patient's chest pain is radiation from her abdomen, this is intermittent, and not present at the time of final disposition. Patient does have a pacemaker, she has no syncopal episodes, no signs of cardiac dysfunction that would require further evaluation and management here in the ED. Patient will be discharged home with close follow-up with cardiology, close follow-up with her primary care for reevaluation further management. She is given strict return precautions, she verbalized understanding and agreement to today's plan had no further questions concerns the time discharge.    Final Clinical Impressions(s) / ED Diagnoses   Final diagnoses:  RLQ abdominal pain  Atypical chest pain    New Prescriptions Discharge Medication List as of 10/02/2016  5:06 PM       Eyvonne Mechanic, PA-C 10/02/16 2023    Mancel Bale, MD 10/03/16 [phone]

## 2016-10-02 NOTE — Discharge Instructions (Signed)
Please follow-up with her cardiologist for ongoing management of your pacemaker. Please follow-up with her primary care for reevaluation of her ongoing abdominal discomfort. Please return the emergency room immediately for develop any new or worsening signs or symptoms.

## 2016-10-02 NOTE — MAU Provider Note (Signed)
History     CSN: [id number]  Arrival date and time: 10/02/16 1002   First Provider Initiated Contact with Patient 10/02/16 1021      Chief Complaint  Patient presents with  . Chest Pain   Patient is a 31 year old G2 P2 who presents today for ongoing abdominal and chest pain. She has a history of pacemaker placement in 2006 due to syncope. She presented to urgent care last night and had a workup of her chest pain including EKG which showed normal sinus rhythm but no pacemaker spikes. She reports continued abdominal pain and she believes this as well as causing her chest hurt. The abdominal pain is worse in the lower abdomen mostly in the right lower quadrant and suprapubic areas. Urinalysis done yesterday evening was negative. She was informed they did not see her IUD strings when she was seen in urgent care last night. She reports the abdominal pain has been going on for several days. She does have a history of ovarian cysts which she may believe is causing this pain. She does have some mild nausea and decreased appetite but no vomiting. She denies any vaginal bleeding. She denies any fevers or chills.    OB History    Gravida Para Term Preterm AB Living   2 2       2    SAB TAB Ectopic Multiple Live Births                  Past Medical History:  Diagnosis Date  . Acne   . Anxiety    panic attacks  . Daily headache   . Heart murmur   . History of IBS   . History of kidney infection   . Hypothyroidism   . Neurocardiogenic syncope   . Neurocardiogenic syncope   . Orthopnoea   . 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 Panic attacks   . Symptomatic bradycardia   . Thyroid disease     Past Surgical History:  Procedure Laterality Date  . DILATION AND CURETTAGE OF UTERUS  2013  . INSERT / REPLACE / REMOVE PACEMAKER  09/2004  . THYROIDECTOMY  11/04/2013  . THYROIDECTOMY N/A 11/04/2013   Procedure: THYROIDECTOMY;  Surgeon: Ernestene Mention, MD;  Location: [facility];  Service:  General;  Laterality: N/A;    Family History  Problem Relation Age of Onset  . Thyroid disease Mother   . Heart disease Mother   . Thyroid disease Maternal Uncle   . Thyroid disease Maternal Grandmother   . Heart failure Maternal Grandmother   . Hypertension Maternal Grandmother   . Hypertension Maternal Grandfather     Social History  Substance Use Topics  . Smoking status: Current Every Day Smoker    Years: 0.50    Types: Cigars  . Smokeless tobacco: Never Used     Comment: 11/04/2013 "smoke 3 black N milds/day"  . Alcohol use Yes     Comment: 11/04/2013 "mixed drinks or wine once a month    Allergies:  Allergies  Allergen Reactions  . Banana Anaphylaxis    Swelling of throat  . 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 Latex Rash  . Penicillins Other (See Comments)    Has patient had a PCN reaction causing immediate rash, facial/tongue/throat swelling, SOB or lightheadedness with hypotension: unknown Has patient had a PCN reaction causing severe rash involving mucus membranes or skin necrosis:unknownHas  patient had a PCN reaction that required hospitalization unknown Has patient had a PCN reaction occurring within the last 10 years: unknown If all of the above answers are "NO", then may proceed with Cephalosporin use.     Prescriptions Prior to Admission  Medication Sig Dispense Refill Last Dose  . levothyroxine (SYNTHROID, LEVOTHROID) 112 MCG tablet TAKE ONE TABLET BY MOUTH ONCE DAILY (NO  FURTHER  REFILLS  UNTIL  PATIENT  IS  SEEN) 90 tablet 0 10/02/2016 at Unknown time    Review of Systems  Constitutional: Negative for activity change, appetite change, chills and diaphoresis.  HENT: Negative for congestion and rhinorrhea.   Respiratory: Negative for apnea, cough, chest tightness and shortness of breath.   Cardiovascular: Positive for chest pain. Negative for palpitations and leg swelling.   Gastrointestinal: Positive for abdominal pain and nausea. Negative for abdominal distention, constipation, diarrhea and vomiting.  Genitourinary: Negative for difficulty urinating, dysuria, flank pain and frequency.  Neurological: Negative for dizziness and weakness.   Physical Exam   Blood pressure 126/85, pulse 84, temperature 99 F (37.2 C), resp. rate 18, SpO2 100 %.  Physical Exam  Vitals reviewed. Constitutional: She is oriented to person, place, and time. She appears well-developed and well-nourished.  HENT:  Head: Normocephalic and atraumatic.  Cardiovascular: Normal rate, regular rhythm, normal heart sounds and intact distal pulses.   Respiratory: Effort normal and breath sounds normal. No respiratory distress. She has no wheezes.  GI: Soft. Bowel sounds are normal. She exhibits no distension. There is no rebound and no guarding.  Tenderness in the right lower quadrant and suprapubic areas  Genitourinary:  Genitourinary Comments: Significant thin white vaginal discharge, no cervical motion tenderness, right adnexal tenderness.  Musculoskeletal: Normal range of motion. She exhibits no edema.  Neurological: She is alert and oriented to person, place, and time.  Skin: Skin is warm and dry. No erythema.  Psychiatric: She has a normal mood and affect. Her behavior is normal.    MAU Course  Procedures  MDM In MA U patient underwent evaluation including EKG which I reviewed myself and showed normal sinus rhythm at approximately 85 bpm. There were no ST segment changes and no T wave inversions.  Plan she had troponins as well as lipase amylase CMP and CBC checked all appeared to be within normal limits.  GC/CT and wet prep were sent and a pelvic ultrasound was ordered to evaluate for ovarian cysts.  Pelvic ultrasound reviewed and within normal limits. Discussed with patient that I do not believe she has a GU source of her pain and given her cardiac history believe she should be  [facility]. Patient is in agreement. D[facility] [place] who states Dr. Rush Landmarkegeler will accept the patient.   Assessment and Plan  #1: Abdominal pain, not GU in nature possibly GI: LFTs as well as lipase and amylase within normal limits, no white blood cell count #2: Chest pain unclear etiology EKG with normal sinus rhythm, patient does have history of pacemaker however [facility].  Ernestina Pennaicholas Allahna Husband 10/02/2016, 11:25 AM

## 2016-10-02 NOTE — MAU Note (Addendum)
Pt started having chest pain on Thursday. Went to urgent care. Urgent care told her to come here. She hasn't been to the cardiologist or called them.

## 2016-10-03 LAB — GC/CHLAMYDIA PROBE AMP (~~LOC~~) NOT AT ARMC
CHLAMYDIA, DNA PROBE: NEGATIVE
Neisseria Gonorrhea: NEGATIVE

## 2016-10-03 LAB — CERVICOVAGINAL ANCILLARY ONLY
Chlamydia: NEGATIVE
Neisseria Gonorrhea: NEGATIVE
WET PREP (BD AFFIRM): NEGATIVE

## 2016-10-22 ENCOUNTER — Encounter: Payer: Self-pay | Admitting: Cardiovascular Disease

## 2016-10-22 ENCOUNTER — Ambulatory Visit (INDEPENDENT_AMBULATORY_CARE_PROVIDER_SITE_OTHER): Payer: Self-pay | Admitting: Cardiovascular Disease

## 2016-10-22 VITALS — BP 127/82 | HR 72 | Ht 60.0 in | Wt 149.2 lb

## 2016-10-22 DIAGNOSIS — Z950 Presence of cardiac pacemaker: Secondary | ICD-10-CM

## 2016-10-22 DIAGNOSIS — E890 Postprocedural hypothyroidism: Secondary | ICD-10-CM

## 2016-10-22 DIAGNOSIS — R55 Syncope and collapse: Secondary | ICD-10-CM

## 2016-10-22 NOTE — Progress Notes (Signed)
Cardiology Office Note    Date:  10/22/2016   ID:  Jean Rowe, DOB [birth year], MRN [id number]  PCP:  Ailene Ravel, MD  Cardiologist:   Thurmon Fair, MD   Chief Complaint  Patient presents with  . Follow-up    pt was in ER with chest pain and dizziness 2 weeks ago    History of Present Illness:  Jean Rowe is a 31 y.o. 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 This is her first clinical visit and device check since 2014. She has not had syncopal events in the interim, but she has occasional dizzy spells following characteristic triggers. She is able to recognize the syncopal prodrome and sit down or lay down to avoid full blown syncope.  Her pacemaker is functioning normally. She has roughly one year of remaining battery longevity. There have been only 4 episodes of mode switch in the last 3 years, without available electrograms. She does not require routine atrial or ventricular pacing. There are frequent episodes of sudden bradycardia response occurring several times a week. Many of them occur at night and are probably related to sinus arrhythmia, rather than true presyncope. Lead parameters remain excellent. Checked today atrial sensing P waves 3.7 mV, impedance 380 ohm, atrial threshold 0.2 V is 0.5 ms pulse width. Ventricular lead sensed R waves 9.5 mV, impedance 410 ohms, threshold 1.0 V is 0.5 ms pulse width. Heart rate histograms are normal. Pacing occurs less than 1% of the time.  She denies palpitations or syncope. She has had some sharp shooting left-sided chest discomfort, associated with abdominal pain as well, that is clearly nonexertional and sounds convincingly musculoskeletal. She was evaluated for this in the emergency room on January 9 with benign findings.  Past  Medical History:  Diagnosis Date  . Acne   . Anxiety    panic attacks  . Daily headache   . Heart murmur   . History of IBS   . History of kidney infection   . Hypothyroidism   . Neurocardiogenic syncope   . Neurocardiogenic syncope   . Orthopnoea   . 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 Panic attacks   . Symptomatic bradycardia   . Thyroid disease     Past Surgical History:  Procedure Laterality Date  . DILATION AND CURETTAGE OF UTERUS  2013  . INSERT / REPLACE / REMOVE PACEMAKER  09/2004  . THYROIDECTOMY  11/04/2013  . THYROIDECTOMY N/A 11/04/2013   Procedure: THYROIDECTOMY;  Surgeon: Ernestene Mention, MD;  Location: [facility] OR;  Service: General;  Laterality: N/A;    Current Medications: Outpatient Medications Prior to Visit  Medication Sig Dispense Refill  . levothyroxine (SYNTHROID, LEVOTHROID) 112 MCG tablet TAKE ONE TABLET BY MOUTH ONCE DAILY (NO  FURTHER  REFILLS  UNTIL  PATIENT  IS  SEEN) 90 tablet 0   No facility-administered medications prior to visit.      Allergies:   Banana; Other; Latex; and Penicillins   Social History   Social History  . Marital status: Single    Spouse name: N/A  . Number of children: N/A  . Years of education: N/A   Social History Main Topics  . Smoking status: Current Every Day Smoker    Years: 0.50    Types: Cigars  . Smokeless tobacco: Never Used  Comment: 11/04/2013 "smoke 3 black N milds/day"  . Alcohol use Yes     Comment: 11/04/2013 "mixed drinks or wine once a month  . Drug use: No     Comment: none for 6 mos. since 03/2014  . Sexual activity: Yes    Birth control/ protection: IUD   Other Topics Concern  . None   Social History Narrative  . None     Family History:  The patient's family history includes Heart disease in her mother; Heart failure in her maternal grandmother; Hypertension in her maternal grandfather and maternal grandmother; Thyroid disease in her maternal grandmother, maternal uncle, and  mother.   ROS:   Please see the history of present illness.    ROS All other systems reviewed and are negative.   PHYSICAL EXAM:   VS:  BP 127/82 (BP Location: Left Arm, Patient Position: Sitting, Cuff Size: Normal)   Pulse 72   Ht 5' (1.524 m)   Wt 67.7 kg (149 lb 3.2 oz)   BMI 29.14 kg/m    GEN: Well nourished, well developed, in no acute distress  HEENT: normal  Neck: no JVD, carotid bruits, or masses Cardiac: RRR; no murmurs, rubs, or gallops,no edema , healthy left subclavian pacemaker site Respiratory:  clear to auscultation bilaterally, normal work of breathing GI: soft, nontender, nondistended, + BS MS: no deformity or atrophy  Skin: warm and dry, no rash Neuro:  Alert and Oriented x 3, Strength and sensation are intact Psych: euthymic mood, full affect  Wt Readings from Last 3 Encounters:  10/22/16 67.7 kg (149 lb 3.2 oz)  10/02/16 63.5 kg (140 lb)  08/06/16 70.3 kg (155 lb)      Studies/Labs Reviewed:   EKG:  EKG is ordered today.  The ekg ordered today demonstrates Sinus rhythm with profound sinus arrhythmia both use  Recent Labs: 08/06/2016: Magnesium 2.0; TSH 4.28 10/02/2016: ALT 11; BUN 7; Creatinine, Ser 0.80; Hemoglobin 13.3; Platelets 261; Potassium 3.8; Sodium 136    ASSESSMENT:    1. Neurocardiogenic syncope   2. Pacemaker - [employer] 2006   3. Postsurgical hypothyroidism      PLAN:  In order of problems listed above:  1. Neurocardiogenic syncope: This has resolved completely following pacemaker implantation, after she unsuccessfully tried treatment with fludrocortisone and beta blockers. She still has some mild near syncopal events likely related to vasodepressor effect. Discussed the importance of staying well hydrated, eating a relatively higher sodium diet, voiding known triggers, avoiding prolonged orthostasis without moving, and close attention to prodromal symptoms and immediately seeking a supine position to avoid full  blown syncope. 2. PPM: Normal pacemaker function, but device entering its last anticipated year of service. Discussed the importance of routine checks every 3 months. She will lose sudden bradycardia response intervention when the device reaches ERI. If her lead parameters remain as they are currently, she will only need a generator change out. Her next device will be capable of remote monitoring and hopefully this will improve compliance with follow-up. 3. Hypothyroidism: On supplement with recent normal TSH    Medication Adjustments/Labs and Tests Ordered: Current medicines are reviewed at length with the patient today.  Concerns regarding medicines are outlined above.  Medication changes, Labs and Tests ordered today are listed in the Patient Instructions below. Patient Instructions  Dr Royann Shivers recommends that you continue on your current medications as directed. Please refer to the Current Medication list given to you today.  Your physician recommends that you schedule  an appointment [facility] in 3 months for a pacemaker check. Our device clinic is located at [address], [address].  Dr Royann Shiversroitoru recommends that you schedule a follow-up appointment in 1 year. You will receive a reminder letter in the mail two months in advance. If you don't receive a letter, please call our office to schedule the follow-up appointment.  If you need a refill on your cardiac medications before your next appointment, please call your pharmacy.   Dr C recommends that you drink plenty of fluids and eat a moderately salty diet to help your blood pressure.    Signed, Thurmon FairMihai Aymee Fomby, MD  10/22/2016 12:08 PM    [facility] [address], [place], [place]  [address] Phone: [phone]) [phone]; Fax: [phone]) [phone]

## 2016-10-22 NOTE — Patient Instructions (Addendum)
Dr Royann Shiversroitoru recommends that you continue on your current medications as directed. Please refer to the Current Medication list given to you today.  Your physician recommends that you schedule an appointment [facility] in 3 months for a pacemaker check. Our device clinic is located at [address], [address].  Dr Royann Shiversroitoru recommends that you schedule a follow-up appointment in 1 year. You will receive a reminder letter in the mail two months in advance. If you don't receive a letter, please call our office to schedule the follow-up appointment.  If you need a refill on your cardiac medications before your next appointment, please call your pharmacy.   Dr C recommends that you drink plenty of fluids and eat a moderately salty diet to help your blood pressure.

## 2016-10-26 ENCOUNTER — Encounter: Payer: Self-pay | Admitting: Cardiovascular Disease

## 2016-11-01 LAB — CUP PACEART INCLINIC DEVICE CHECK
Date Time Interrogation Session: 20180208172554
Implantable Lead Implant Date: 20060119
Implantable Lead Location: 753859
Implantable Lead Model: 4469
Implantable Lead Serial Number: 439053
Implantable Lead Serial Number: 457210
Implantable Pulse Generator Implant Date: 20060119
MDC IDC LEAD IMPLANT DT: 20060119
MDC IDC LEAD LOCATION: 753860
MDC IDC SET LEADCHNL RA PACING AMPLITUDE: 2.5 V
MDC IDC SET LEADCHNL RV PACING AMPLITUDE: 2.5 V
MDC IDC SET LEADCHNL RV PACING PULSEWIDTH: 0.5 ms
MDC IDC SET LEADCHNL RV SENSING SENSITIVITY: 2.5 mV
Pulse Gen Model: 1291
Pulse Gen Serial Number: 118658

## 2016-11-30 ENCOUNTER — Encounter (HOSPITAL_COMMUNITY): Payer: Self-pay | Admitting: Emergency Medicine

## 2016-11-30 ENCOUNTER — Emergency Department (HOSPITAL_COMMUNITY)
Admission: EM | Admit: 2016-11-30 | Discharge: 2016-11-30 | Disposition: A | Discharge status: Home / Self Care | Payer: Self-pay | Attending: Emergency Medicine | Admitting: Emergency Medicine

## 2016-11-30 ENCOUNTER — Emergency Department (HOSPITAL_COMMUNITY): Payer: Self-pay

## 2016-11-30 DIAGNOSIS — E039 Hypothyroidism, unspecified: Secondary | ICD-10-CM | POA: Insufficient documentation

## 2016-11-30 DIAGNOSIS — F17290 Nicotine dependence, other tobacco product, uncomplicated: Secondary | ICD-10-CM | POA: Insufficient documentation

## 2016-11-30 DIAGNOSIS — Y939 Activity, unspecified: Secondary | ICD-10-CM | POA: Insufficient documentation

## 2016-11-30 DIAGNOSIS — W268XXA Contact with other sharp object(s), not elsewhere classified, initial encounter: Secondary | ICD-10-CM | POA: Insufficient documentation

## 2016-11-30 DIAGNOSIS — Y929 Unspecified place or not applicable: Secondary | ICD-10-CM | POA: Insufficient documentation

## 2016-11-30 DIAGNOSIS — Z950 Presence of cardiac pacemaker: Secondary | ICD-10-CM | POA: Insufficient documentation

## 2016-11-30 DIAGNOSIS — Y999 Unspecified external cause status: Secondary | ICD-10-CM | POA: Insufficient documentation

## 2016-11-30 DIAGNOSIS — Z23 Encounter for immunization: Secondary | ICD-10-CM | POA: Insufficient documentation

## 2016-11-30 DIAGNOSIS — S61411A Laceration without foreign body of right hand, initial encounter: Secondary | ICD-10-CM | POA: Insufficient documentation

## 2016-11-30 MED ORDER — TETANUS-DIPHTH-ACELL PERTUSSIS 5-2.5-18.5 LF-MCG/0.5 IM SUSP
0.5000 mL | Freq: Once | INTRAMUSCULAR | Status: AC
Start: 2016-11-30 — End: 2016-11-30
  Administered 2016-11-30: 0.5 mL via INTRAMUSCULAR
  Filled 2016-11-30: qty 0.5

## 2016-11-30 MED ORDER — ACETAMINOPHEN 500 MG PO TABS
500.0000 mg | ORAL_TABLET | Freq: Once | ORAL | Status: AC
  Administered 2016-11-30: 500 mg via ORAL
  Filled 2016-11-30: qty 1

## 2016-11-30 MED ORDER — LIDOCAINE HCL (PF) 1 % IJ SOLN
5.0000 mL | Freq: Once | INTRAMUSCULAR | Status: AC
Start: 2016-11-30 — End: 2016-11-30
  Administered 2016-11-30: 5 mL
  Filled 2016-11-30: qty 5

## 2016-11-30 NOTE — ED Triage Notes (Signed)
Pt here for evaluation of hand laceration present to right hand. Reports cutting her hand on mason jar glass while washing dishes.

## 2016-11-30 NOTE — Discharge Instructions (Signed)
Please follow-up with primary care doctor or return in 6-7 days for suture removal. Please antibiotic ointment applied. Keep wound clean and dry. Wash with mild soap and water. Do not soak in a tub. Motrin and Tylenol for pain and swelling. Wear the brace to keep hand immobile for wound healing. Watch for signs of infection including discharge, worsening pain, redness, worsening swelling, fevers return to the ED if she develops any these symptoms.

## 2016-11-30 NOTE — Progress Notes (Signed)
Orthopedic Tech Progress Note Patient Details:  Jean DoeQuionna M Rowe 1986/01/25 [id number]  Ortho Devices Type of Ortho Device: Thumb velcro splint Ortho Device/Splint Location: RUE Ortho Device/Splint Interventions: Ordered, Application   Jean Rowe, Jean Rowe 11/30/2016, 3:09 PM

## 2016-11-30 NOTE — ED Provider Notes (Signed)
[facility]-EMERGENCY DEPT Provider Note   CSN: [id number] Arrival date & time: 11/30/16  1225  By signing my name below, I, Marnette Burgess Long, attest that this documentation has been prepared under the direction and in the presence of [employer], PA-C. Electronically Signed: Marnette Burgess Long, Scribe. 11/30/2016. 1:40 PM.    History   Chief Complaint Chief Complaint  Patient presents with  . Hand Injury   The history is provided by the patient. No language interpreter was used.    HPI Comments:  Jean Rowe is a 31 y.o. female with a PMHx of Heart Murmur, Symptomatic Bradycardia,  Orthopnea, IBS, Pacemaker, and Hypothyroidism, who presents to the Emergency Department for evaluation of a right hand laceration in the right purlicue space s/p cutting it on a broken mason jar while washing dishes an hour and half ago. Pt has associated symptoms of 7/10 right hand pain. Bleeding is controlled at this time and covered with a bandage from triage. She did not take anything PTA for relief of her pain. Pt denies feverParesthesias, weakness and any other complaints at this time. Pt is a current every day smoker. TDAP is out of date.    Past Medical History:  Diagnosis Date  . Acne   . Anxiety    panic attacks  . Daily headache   . Heart murmur   . History of IBS   . History of kidney infection   . Hypothyroidism   . Neurocardiogenic syncope   . Neurocardiogenic syncope   . Orthopnoea   . 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 Panic attacks   . Symptomatic bradycardia   . Thyroid disease     Patient Active Problem List   Diagnosis Date Noted  . UTI (urinary tract infection) 10/19/2014  . Paresthesia 09/01/2014  . 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 Neurocardiogenic syncope 06/23/2013  . Pacemaker - [employer] 2006 06/23/2013  . Goiter 06/23/2013    Past Surgical History:  Procedure Laterality  Date  . DILATION AND CURETTAGE OF UTERUS  2013  . INSERT / REPLACE / REMOVE PACEMAKER  09/2004  . THYROIDECTOMY  11/04/2013  . THYROIDECTOMY N/A 11/04/2013   Procedure: THYROIDECTOMY;  Surgeon: Ernestene Mention, MD;  Location: [facility] OR;  Service: General;  Laterality: N/A;    OB History    Gravida Para Term Preterm AB Living   2 2       2    SAB TAB Ectopic Multiple Live Births                   Home Medications    Prior to Admission medications   Medication Sig Start Date End Date Taking? Authorizing Provider  levothyroxine (SYNTHROID, LEVOTHROID) 112 MCG tablet TAKE ONE TABLET BY MOUTH ONCE DAILY (NO  FURTHER  REFILLS  UNTIL  PATIENT  IS  SEEN) 09/05/16   Reather Littler, MD    Family History Family History  Problem Relation Age of Onset  . Thyroid disease Mother   . Heart disease Mother   . Thyroid disease Maternal Uncle   . Thyroid disease Maternal Grandmother   . Heart failure Maternal Grandmother   . Hypertension Maternal Grandmother   . Hypertension Maternal Grandfather     Social History Social History  Substance Use Topics  . Smoking status: Current Every Day Smoker    Years: 0.50    Types: Cigars  . Smokeless tobacco: Never Used  Comment: 11/04/2013 "smoke 3 black N milds/day"  . Alcohol use Yes     Comment: 11/04/2013 "mixed drinks or wine once a month     Allergies   Banana; Other; Latex; and Penicillins   Review of Systems Review of Systems  Constitutional: Negative for fever.  Musculoskeletal: Positive for myalgias.  Skin: Positive for wound.  All other systems reviewed and are negative.    Physical Exam Updated Vital Signs BP 125/86 (BP Location: Left Arm)   Pulse 88   Temp 97.8 F (36.6 C) (Oral)   Resp 16   Ht 5' (1.524 m)   Wt 150 lb (68 kg)   SpO2 100%   BMI 29.29 kg/m   Physical Exam  Constitutional: She is oriented to person, place, and time. She appears well-developed and well-nourished.  HENT:  Head: Normocephalic.  Eyes:  Conjunctivae are normal.  Cardiovascular: Normal rate.   Pulmonary/Chest: Effort normal.  Abdominal: She exhibits no distension.  Musculoskeletal: Normal range of motion.  Neurological: She is alert and oriented to person, place, and time.  Skin: Skin is warm and dry.  1cm laceration to right purlicue. Bleeding is controlled. No debridement noted. Full opposition of thumb along with abduction and adduction of the thumb. Grip strength 5/5. Bilateral Radial pulses 2+. Cap refill normal. Do not appreciate tendon involvement. Full flexion MCP and PIP of first phalange.   Psychiatric: She has a normal mood and affect.  Nursing note and vitals reviewed.     ED Treatments / Results  DIAGNOSTIC STUDIES:  Oxygen Saturation is 100% on RA, normal by my interpretation.    COORDINATION OF CARE:  1:39 PM Discussed treatment plan with pt at bedside including laceration repair with TDAP and pt agreed to plan.  Labs (all labs ordered are listed, but only abnormal results are displayed) Labs Reviewed - No data to display  EKG  EKG Interpretation None       Radiology Dg Hand Complete Right  Result Date: 11/30/2016 CLINICAL DATA:  Thumb laceration while washing dishes. EXAM: RIGHT HAND - COMPLETE 3+ VIEW COMPARISON:  None. FINDINGS: No acute fracture deformity or dislocation. Joint spaces intact without erosions. No destructive bony lesions. Soft tissue planes are not suspicious. IMPRESSION: Negative. Electronically Signed   By: Awilda Metroourtnay  Bloomer M.D.   On: 11/30/2016 13:35    Procedures Procedures (including critical care time) LACERATION REPAIR PROCEDURE NOTE The patient's identification was confirmed and consent was obtained. This procedure was performed by Wilburn MylarKenneth Tyler Aiysha Jillson, PA-C at 2:20 PM. Site: right purlicue space Sterile procedures observed: betadine, NS pressure irrigated, sterile procedure observed Anesthetic used (type and amt): Lidocaine 1% without epi, 5 Suture  type/size:Prolene, 4-0 Length:1.5cm x 1cm # of Sutures: 6 Technique:simple interrupted Complexity: simple Antibx ointment applied: yes Tetanus ordered Site anesthetized, irrigated with NS, explored without evidence of foreign body, wound well approximated, site covered with dry, sterile dressing.  Patient tolerated procedure well without complications. Instructions for care discussed verbally and patient provided with additional written instructions for homecare and f/u.   Medications Ordered in ED Medications  lidocaine (PF) (XYLOCAINE) 1 % injection 5 mL (5 mLs Infiltration Given 11/30/16 1425)  Tdap (BOOSTRIX) injection 0.5 mL (0.5 mLs Intramuscular Given 11/30/16 1424)  acetaminophen (TYLENOL) tablet 500 mg (500 mg Oral Given 11/30/16 1425)     Initial Impression / Assessment and Plan / ED Course  I have reviewed the triage vital signs and the nursing notes.  Pertinent labs & imaging results that were available  during my care of the patient were reviewed by me and considered in my medical decision making (see chart for details).     Tdap booster given.Pressure irrigation performed. Laceration occurred < 8 hours prior to repair which was well tolerated. Patient is neurovascularly intact. Full range of motion with opposition, abduction, abduction, flexion, extension. Pt has no co morbidities to effect normal wound healing. Discussed suture home care w pt and answered questions. No debridement noted. Do not fill in about X are warranted this time. Patient was dressed with anabolic ointment and dressing. Pt to f-u for wound check and suture removal in 7 days. Pt is hemodynamically stable w no complaints prior to dc.     Final Clinical Impressions(s) / ED Diagnoses   Final diagnoses:  Laceration of right hand without foreign body, initial encounter    New Prescriptions Discharge Medication List as of 11/30/2016  3:18 PM     I personally performed the services described in this  documentation, which was scribed in my presence. The recorded information has been reviewed and is accurate.     Rise Mu, PA-C 11/30/16 1605    Melene Plan, DO 11/30/16 [place]

## 2016-12-19 ENCOUNTER — Other Ambulatory Visit: Payer: Self-pay | Admitting: Endocrinology

## 2017-02-06 ENCOUNTER — Ambulatory Visit (INDEPENDENT_AMBULATORY_CARE_PROVIDER_SITE_OTHER): Payer: Self-pay | Admitting: *Deleted

## 2017-02-06 DIAGNOSIS — Z950 Presence of cardiac pacemaker: Secondary | ICD-10-CM

## 2017-02-06 DIAGNOSIS — R55 Syncope and collapse: Secondary | ICD-10-CM

## 2017-02-06 LAB — CUP PACEART INCLINIC DEVICE CHECK
Brady Statistic RV Percent Paced: 0 %
Date Time Interrogation Session: 20180516040000
Implantable Lead Implant Date: 20060119
Implantable Lead Location: 753859
Implantable Lead Location: 753860
Implantable Lead Serial Number: 439053
Implantable Lead Serial Number: 457210
Implantable Pulse Generator Implant Date: 20060119
Lead Channel Impedance Value: 430 Ohm
Lead Channel Pacing Threshold Amplitude: 0.5 V
Lead Channel Pacing Threshold Pulse Width: 0.5 ms
Lead Channel Sensing Intrinsic Amplitude: 3.2 mV
Lead Channel Setting Pacing Amplitude: 2.5 V
Lead Channel Setting Pacing Amplitude: 2.5 V
Lead Channel Setting Pacing Pulse Width: 0.5 ms
Lead Channel Setting Sensing Sensitivity: 2.5 mV
MDC IDC LEAD IMPLANT DT: 20060119
MDC IDC MSMT LEADCHNL RA IMPEDANCE VALUE: 400 Ohm
MDC IDC MSMT LEADCHNL RA PACING THRESHOLD PULSEWIDTH: 0.5 ms
MDC IDC MSMT LEADCHNL RV PACING THRESHOLD AMPLITUDE: 0.7 V
MDC IDC MSMT LEADCHNL RV SENSING INTR AMPL: 9.9 mV
MDC IDC STAT BRADY RA PERCENT PACED: 1 %
Pulse Gen Model: 1291
Pulse Gen Serial Number: 118658

## 2017-02-06 NOTE — Progress Notes (Signed)
Pacemaker check in clinic. Normal device function. Thresholds, sensing, impedances consistent with previous measurements. Device programmed to maximize longevity. SBR episodes recorded, pt denies syncope, reports occasional transient dizzy spells. 4 ATR mode switches, total time 1.824min, no EGMs. No high ventricular rates noted. Device programmed at appropriate safety margins. Histogram distribution appropriate for patient activity level. Device programmed to optimize intrinsic conduction. Estimated longevity <0.5 years. Patient education completed. ROV with [facility] Clinic on 04/03/17 (battery check only, N/C), and ROV with [facility] in 09/2017.

## 2017-02-23 ENCOUNTER — Emergency Department (HOSPITAL_COMMUNITY)
Admission: EM | Admit: 2017-02-23 | Discharge: 2017-02-23 | Disposition: A | Discharge status: Home / Self Care | Payer: Medicaid Other | Attending: Emergency Medicine | Admitting: Emergency Medicine

## 2017-02-23 ENCOUNTER — Emergency Department (HOSPITAL_COMMUNITY): Payer: Medicaid Other

## 2017-02-23 ENCOUNTER — Encounter (HOSPITAL_COMMUNITY): Payer: Self-pay | Admitting: Emergency Medicine

## 2017-02-23 DIAGNOSIS — Z950 Presence of cardiac pacemaker: Secondary | ICD-10-CM | POA: Insufficient documentation

## 2017-02-23 DIAGNOSIS — F17290 Nicotine dependence, other tobacco product, uncomplicated: Secondary | ICD-10-CM | POA: Insufficient documentation

## 2017-02-23 DIAGNOSIS — R1031 Right lower quadrant pain: Secondary | ICD-10-CM | POA: Insufficient documentation

## 2017-02-23 DIAGNOSIS — Z91040 Latex allergy status: Secondary | ICD-10-CM | POA: Insufficient documentation

## 2017-02-23 DIAGNOSIS — E039 Hypothyroidism, unspecified: Secondary | ICD-10-CM | POA: Insufficient documentation

## 2017-02-23 LAB — COMPREHENSIVE METABOLIC PANEL
ALT: 12 U/L — ABNORMAL LOW (ref 14–54)
AST: 18 U/L (ref 15–41)
Albumin: 3.9 g/dL (ref 3.5–5.0)
Alkaline Phosphatase: 62 U/L (ref 38–126)
Anion gap: 6 (ref 5–15)
BUN: 6 mg/dL (ref 6–20)
CHLORIDE: 103 mmol/L (ref 101–111)
CO2: 26 mmol/L (ref 22–32)
Calcium: 8.7 mg/dL — ABNORMAL LOW (ref 8.9–10.3)
Creatinine, Ser: 0.87 mg/dL (ref 0.44–1.00)
GFR calc non Af Amer: >60 mL/min (ref >60)
Glucose, Bld: 87 mg/dL (ref 65–99)
POTASSIUM: 4 mmol/L (ref 3.5–5.1)
Sodium: 135 mmol/L (ref 135–145)
TOTAL PROTEIN: 6.7 g/dL (ref 6.5–8.1)
Total Bilirubin: 0.8 mg/dL (ref 0.3–1.2)

## 2017-02-23 LAB — CBC WITH DIFFERENTIAL/PLATELET
BASOS ABS: 0 10*3/uL (ref 0.0–0.1)
Basophils Relative: 0 %
EOS ABS: 0.1 10*3/uL (ref 0.0–0.7)
Eosinophils Relative: 1 %
HCT: 38.9 % (ref 36.0–46.0)
HEMOGLOBIN: 13.4 g/dL (ref 12.0–15.0)
LYMPHS ABS: 2.2 10*3/uL (ref 0.7–4.0)
LYMPHS PCT: 27 %
MCH: 32 pg (ref 26.0–34.0)
MCHC: 34.4 g/dL (ref 30.0–36.0)
MCV: 92.8 fL (ref 78.0–100.0)
Monocytes Absolute: 0.3 10*3/uL (ref 0.1–1.0)
Monocytes Relative: 4 %
Neutro Abs: 5.5 10*3/uL (ref 1.7–7.7)
Neutrophils Relative %: 68 %
Platelets: 293 10*3/uL (ref 150–400)
RBC: 4.19 MIL/uL (ref 3.87–5.11)
RDW: 11.9 % (ref 11.5–15.5)
WBC: 8.1 10*3/uL (ref 4.0–10.5)

## 2017-02-23 LAB — URINALYSIS, ROUTINE W REFLEX MICROSCOPIC
Bilirubin Urine: NEGATIVE
Glucose, UA: NEGATIVE mg/dL
HGB URINE DIPSTICK: NEGATIVE
Ketones, ur: NEGATIVE mg/dL
LEUKOCYTES UA: NEGATIVE
Nitrite: NEGATIVE
PROTEIN: NEGATIVE mg/dL
SPECIFIC GRAVITY, URINE: 1.016 (ref 1.005–1.030)
pH: 8 (ref 5.0–8.0)

## 2017-02-23 LAB — WET PREP, GENITAL
CLUE CELLS WET PREP: NONE SEEN
Trich, Wet Prep: NONE SEEN
Yeast Wet Prep HPF POC: NONE SEEN

## 2017-02-23 LAB — POC URINE PREG, ED: PREG TEST UR: NEGATIVE

## 2017-02-23 LAB — LIPASE, BLOOD: Lipase: 21 U/L (ref 11–51)

## 2017-02-23 MED ORDER — CEFTRIAXONE SODIUM 250 MG IJ SOLR
250.0000 mg | Freq: Once | INTRAMUSCULAR | Status: AC
  Administered 2017-02-23: 250 mg via INTRAMUSCULAR
  Filled 2017-02-23: qty 250

## 2017-02-23 MED ORDER — IOPAMIDOL (ISOVUE-300) INJECTION 61%
INTRAVENOUS | Status: AC
  Administered 2017-02-23: 100 mL
  Filled 2017-02-23: qty 100

## 2017-02-23 MED ORDER — HYDROCODONE-ACETAMINOPHEN 5-325 MG PO TABS
1.0000 | ORAL_TABLET | Freq: Once | ORAL | Status: AC
  Administered 2017-02-23: 1 via ORAL
  Filled 2017-02-23: qty 1

## 2017-02-23 MED ORDER — LIDOCAINE HCL (PF) 1 % IJ SOLN
INTRAMUSCULAR | Status: AC
  Administered 2017-02-23: 0.9 mL
  Filled 2017-02-23: qty 5

## 2017-02-23 MED ORDER — KETOROLAC TROMETHAMINE 30 MG/ML IJ SOLN
30.0000 mg | Freq: Once | INTRAMUSCULAR | Status: AC
  Administered 2017-02-23: 30 mg via INTRAVENOUS
  Filled 2017-02-23: qty 1

## 2017-02-23 NOTE — ED Provider Notes (Signed)
[facility] DEPT Provider Note   CSN: [id number] Arrival date & time: 02/23/17  1013     History   Chief Complaint Chief Complaint  Patient presents with  . Abdominal Pain  . Constipation    HPI Jean Rowe is a 31 y.o. female.  Patient with past medical history of right ovarian cyst, thyroidectomy, anxiety, presents with persistent intermittent right lower quadrant abdominal pain that began last  Saturday. She states pain is sharp, worse in the evening when she is laying down, minimal during the day. She also feels constipated, with hard stool occurring this morning. Patient denies decreased appetite, vaginal bleeding, discharge or urinary symptoms of hematuria, fever/chills, back pain, chest pain, shortness of breath.      Past Medical History:  Diagnosis Date  . Acne   . Anxiety    panic attacks  . Daily headache   . Heart murmur   . History of IBS   . History of kidney infection   . Hypothyroidism   . Neurocardiogenic syncope   . Neurocardiogenic syncope   . Orthopnoea   . 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 Panic attacks   . Symptomatic bradycardia   . Thyroid disease     Patient Active Problem List   Diagnosis Date Noted  . UTI (urinary tract infection) 10/19/2014  . Paresthesia 09/01/2014  . 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 Neurocardiogenic syncope 06/23/2013  . Pacemaker - [employer] 2006 06/23/2013  . Goiter 06/23/2013    Past Surgical History:  Procedure Laterality Date  . DILATION AND CURETTAGE OF UTERUS  2013  . INSERT / REPLACE / REMOVE PACEMAKER  09/2004  . THYROIDECTOMY  11/04/2013  . THYROIDECTOMY N/A 11/04/2013   Procedure: THYROIDECTOMY;  Surgeon: Ernestene Mention, MD;  Location: [facility] OR;  Service: General;  Laterality: N/A;    OB History    Gravida Para Term Preterm AB Living   2 2       2    SAB TAB Ectopic Multiple Live Births                    Home Medications    Prior to Admission medications   Medication Sig Start Date End Date Taking? Authorizing Provider  levothyroxine (SYNTHROID, LEVOTHROID) 112 MCG tablet TAKE ONE TABLET BY MOUTH ONCE DAILY. MUST HAVE APPOINTMENT BEFORE FURTHER REFILLS. 12/19/16  Yes Reather Littler, MD    Family History Family History  Problem Relation Age of Onset  . Thyroid disease Mother   . Heart disease Mother   . Thyroid disease Maternal Uncle   . Thyroid disease Maternal Grandmother   . Heart failure Maternal Grandmother   . Hypertension Maternal Grandmother   . Hypertension Maternal Grandfather     Social History Social History  Substance Use Topics  . Smoking status: Current Every Day Smoker    Years: 0.50    Types: Cigars  . Smokeless tobacco: Never Used     Comment: 11/04/2013 "smoke 3 black N milds/day"  . Alcohol use Yes     Comment: 11/04/2013 "mixed drinks or wine once a month     Allergies   Banana; Other; Latex; and Penicillins   Review of Systems Review of Systems  Constitutional: Negative for appetite change and fever.  HENT: Negative for trouble swallowing.   Respiratory: Negative for shortness of breath.   Cardiovascular: Negative for chest pain.  Gastrointestinal: Positive for abdominal pain and constipation. Negative  for diarrhea, nausea and vomiting.  Endocrine:       S/p thyroidectomy  Genitourinary: Negative for dysuria, frequency, hematuria, vaginal bleeding and vaginal discharge.  Musculoskeletal: Negative for back pain.  Skin: Negative for color change.  Allergic/Immunologic: Negative for immunocompromised state.     Physical Exam Updated Vital Signs BP 120/85   Pulse (!) 59   Temp 98.4 F (36.9 C) (Oral)   Resp 16   Ht 5' (1.524 m)   Wt 65.8 kg (145 lb)   SpO2 99%   BMI 28.32 kg/m   Physical Exam  Constitutional: She appears well-developed and well-nourished.  HENT:  Head: Normocephalic and atraumatic.  Eyes: Conjunctivae are normal.   Pulmonary/Chest: Effort normal.  Abdominal: Soft.  Genitourinary: There is no tenderness on the right labia. There is no tenderness on the left labia. Uterus is not tender. Cervix exhibits motion tenderness. Cervix exhibits no friability. Right adnexum displays tenderness. Right adnexum displays no fullness. Left adnexum displays no tenderness and no fullness. Vaginal discharge (yellow, thin) found.  Genitourinary Comments: Exam performed with RN chaperone present. IUD strings present at cervical os  Neurological: She is alert.  Skin: Skin is warm.  Psychiatric: She has a normal mood and affect. Her behavior is normal.  Nursing note and vitals reviewed.    ED Treatments / Results  Labs (all labs ordered are listed, but only abnormal results are displayed) Labs Reviewed  WET PREP, GENITAL - Abnormal; Notable for the following:       Result Value   WBC, Wet Prep HPF POC FEW (*)    All other components within normal limits  COMPREHENSIVE METABOLIC PANEL - Abnormal; Notable for the following:    Calcium 8.7 (*)    ALT 12 (*)    All other components within normal limits  LIPASE, BLOOD  CBC WITH DIFFERENTIAL/PLATELET  URINALYSIS, ROUTINE W REFLEX MICROSCOPIC  POC URINE PREG, ED  GC/CHLAMYDIA PROBE AMP ([place]) NOT AT [facility]    EKG  EKG Interpretation None       Radiology US Transvaginal Non-ob  Result Date: 02/23/2017 CLINICAL DATA:  Pelvic pain for 1 week EXAM: TRANSABDOMINAL AND TRANSVAGINAL ULTRASOUND OF PELVIS DOPPLER ULTRASOUND OF OVARIES TECHNIQUE: Both transabdominal and transvaginal ultrasound examinations of the pelvis were performed. Transabdominal technique was performed for global imaging of the pelvis including uterus, ovaries, adnexal regions, and pelvic cul-de-sac. It was necessary to proceed with endovaginal exam following the transabdominal exam to visualize the ovaries. Color and duplex Doppler ultrasound was utilized to evaluate blood flow to the ovaries.  COMPARISON:  None. FINDINGS: Uterus Measurements: 7.6 x 4.6 x 4.9 cm. No fibroids or other mass visualized. Endometrium Thickness: 3 mm.  An IUD is noted in place. Right ovary Measurements: 3.1 x 2.5 x 2.7 cm. Normal appearance/no adnexal mass. Left ovary Measurements: 2.4 x 1.8 x 2.1 cm. Normal appearance/no adnexal mass. Pulsed Doppler evaluation of both ovaries demonstrates normal low-resistance arterial and venous waveforms. Other findings Minimal free fluid is noted which may be physiologic in nature. IMPRESSION: IUD in place.  No acute abnormality is noted. Electronically Signed   By: Alcide Clever M.D.   On: 02/23/2017 16:30   US Pelvis Complete  Result Date: 02/23/2017 CLINICAL DATA:  Pelvic pain for 1 week EXAM: TRANSABDOMINAL AND TRANSVAGINAL ULTRASOUND OF PELVIS DOPPLER ULTRASOUND OF OVARIES TECHNIQUE: Both transabdominal and transvaginal ultrasound examinations of the pelvis were performed. Transabdominal technique was performed for global imaging of the pelvis including uterus, ovaries, adnexal regions,  and pelvic cul-de-sac. It was necessary to proceed with endovaginal exam following the transabdominal exam to visualize the ovaries. Color and duplex Doppler ultrasound was utilized to evaluate blood flow to the ovaries. COMPARISON:  None. FINDINGS: Uterus Measurements: 7.6 x 4.6 x 4.9 cm. No fibroids or other mass visualized. Endometrium Thickness: 3 mm.  An IUD is noted in place. Right ovary Measurements: 3.1 x 2.5 x 2.7 cm. Normal appearance/no adnexal mass. Left ovary Measurements: 2.4 x 1.8 x 2.1 cm. Normal appearance/no adnexal mass. Pulsed Doppler evaluation of both ovaries demonstrates normal low-resistance arterial and venous waveforms. Other findings Minimal free fluid is noted which may be physiologic in nature. IMPRESSION: IUD in place.  No acute abnormality is noted. Electronically Signed   By: Alcide CleverMark  Lukens M.D.   On: 02/23/2017 16:30   Ct Abdomen Pelvis W Contrast  Result Date:  02/23/2017 CLINICAL DATA:  Right lower quadrant pain EXAM: CT ABDOMEN AND PELVIS WITH CONTRAST TECHNIQUE: Multidetector CT imaging of the abdomen and pelvis was performed using the standard protocol following bolus administration of intravenous contrast. CONTRAST:  100mL ISOVUE-300 IOPAMIDOL (ISOVUE-300) INJECTION 61% COMPARISON:  10/02/2016 FINDINGS: Lower chest: Pacer lead into the right ventricle, partly seen. No acute finding. Hepatobiliary: No focal liver abnormality.No evidence of biliary obstruction or stone. Pancreas: Unremarkable. Spleen: Unremarkable. Adrenals/Urinary Tract: Negative adrenals. No hydronephrosis or definite stone. Detection of calculi is limited by early excretion of contrast. Unremarkable bladder. Stomach/Bowel:  No obstruction. No appendicitis. Vascular/Lymphatic: No acute vascular abnormality. No mass or adenopathy. Reproductive:IUD in stable position. No acute finding. Corpus luteum on the right. Small volume pelvic fluid which may be physiologic. Other: No ascites or pneumoperitoneum. Musculoskeletal: Negative IMPRESSION: 1. No acute finding.  No appendicitis. 2. Corpus luteum on the right with trace pelvic fluid. Electronically Signed   By: Marnee SpringJonathon  Watts M.D.   On: 02/23/2017 13:18   [place] Art/ven Flow Abd Pelv Doppler  Result Date: 02/23/2017 CLINICAL DATA:  Pelvic pain for 1 week EXAM: TRANSABDOMINAL AND TRANSVAGINAL ULTRASOUND OF PELVIS DOPPLER ULTRASOUND OF OVARIES TECHNIQUE: Both transabdominal and transvaginal ultrasound examinations of the pelvis were performed. Transabdominal technique was performed for global imaging of the pelvis including uterus, ovaries, adnexal regions, and pelvic cul-de-sac. It was necessary to proceed with endovaginal exam following the transabdominal exam to visualize the ovaries. Color and duplex Doppler ultrasound was utilized to evaluate blood flow to the ovaries. COMPARISON:  None. FINDINGS: Uterus Measurements: 7.6 x 4.6 x 4.9 cm. No fibroids or  other mass visualized. Endometrium Thickness: 3 mm.  An IUD is noted in place. Right ovary Measurements: 3.1 x 2.5 x 2.7 cm. Normal appearance/no adnexal mass. Left ovary Measurements: 2.4 x 1.8 x 2.1 cm. Normal appearance/no adnexal mass. Pulsed Doppler evaluation of both ovaries demonstrates normal low-resistance arterial and venous waveforms. Other findings Minimal free fluid is noted which may be physiologic in nature. IMPRESSION: IUD in place.  No acute abnormality is noted. Electronically Signed   By: Alcide CleverMark  Lukens M.D.   On: 02/23/2017 16:30    Procedures Procedures (including critical care time)  Medications Ordered in ED Medications  ketorolac (TORADOL) 30 MG/ML injection 30 mg (30 mg Intravenous Given 02/23/17 1137)  iopamidol (ISOVUE-300) 61 % injection (100 mLs  Contrast Given 02/23/17 1258)  HYDROcodone-acetaminophen (NORCO/VICODIN) 5-325 MG per tablet 1 tablet (1 tablet Oral Given 02/23/17 1451)  cefTRIAXone (ROCEPHIN) injection 250 mg (250 mg Intramuscular Given 02/23/17 1452)  lidocaine (PF) (XYLOCAINE) 1 % injection (0.9 mLs  Given 02/23/17 1452)  Initial Impression / Assessment and Plan / ED Course  I have reviewed the triage vital signs and the nursing notes.  Pertinent labs & imaging results that were available during my care of the patient were reviewed by me and considered in my medical decision making (see chart for details).     Pt w RLQ abdominal pain. CT abd without acute abdominal pathology; showing corpus luteum R ovary w trace pelvic fluid. Pelvic exam with CMT and R adnexal tenderness, yellow thin discharge. Remainder of abdominal workup benign; CBC, CMP, U/A, lipase all unremarkable. Urine preg negative Plan to tx pt for PID; Rocephin given in ED (pt reports she has had keflex without reaction).  Wet prep resulted with few WBC and no bacteria. Pt w tenderness on exam not correlating with wet prep; will get U/S to r/o torsion vs abscess.  U/S without torsion or  abscess. Pt is afebrile, hemodynamically stable, safe for discharge. Will discharge with symptomatic management and OB/GYN follow up.   Patient discussed with Dr. Ranae Palms. Discussed results, findings, treatment and follow up. Patient advised of return precautions. Patient verbalized understanding and agreed with plan.   Final Clinical Impressions(s) / ED Diagnoses   Final diagnoses:  RLQ abdominal pain    New Prescriptions New Prescriptions   No medications on file     [place], [place] N, PA-C 02/23/17 1636    Loren Racer, MD 02/28/17 1444

## 2017-02-23 NOTE — ED Notes (Signed)
Pt. Ambulatory to restroom at this time. Pt. Educated on clean catch technique.

## 2017-02-23 NOTE — ED Triage Notes (Signed)
Pt reports last Saturday went to a cook out. Has not had a normal BM since then. Pt reports small BM this morning. Pt denies nausea vomiting. Reports pain to RLQ that radiates across lower abdomen. Painful to palpation.

## 2017-02-23 NOTE — Discharge Instructions (Addendum)
Please read the instructions below.  Talk with your primary care provider about any new medications.  Please schedule an appointment for follow up with your OBGYN.  You can take advil or tylenol as needed for pain. You will receive a call from the hospital if your test results come back positive. Avoid sexual activity until you know your test results. If your results come back positive, it is important that you inform all of your sexual partners.  Return to the ER for fever, or new or concerning symptoms.

## 2017-02-23 NOTE — ED Notes (Signed)
Patient transported to Ultrasound 

## 2017-02-23 NOTE — ED Notes (Signed)
Family at bedside. 

## 2017-02-25 LAB — GC/CHLAMYDIA PROBE AMP (~~LOC~~) NOT AT ARMC
CHLAMYDIA, DNA PROBE: NEGATIVE
Neisseria Gonorrhea: NEGATIVE

## 2017-04-03 ENCOUNTER — Ambulatory Visit (INDEPENDENT_AMBULATORY_CARE_PROVIDER_SITE_OTHER): Payer: Self-pay | Admitting: *Deleted

## 2017-04-03 DIAGNOSIS — R55 Syncope and collapse: Secondary | ICD-10-CM

## 2017-04-03 LAB — CUP PACEART INCLINIC DEVICE CHECK
Implantable Lead Implant Date: 20060119
Implantable Lead Location: 753860
Implantable Lead Model: 4469
Implantable Lead Serial Number: 439053
Implantable Pulse Generator Implant Date: 20060119
MDC IDC LEAD IMPLANT DT: 20060119
MDC IDC LEAD LOCATION: 753859
MDC IDC LEAD SERIAL: 457210
MDC IDC SESS DTM: 20180711102544
Pulse Gen Model: 1291
Pulse Gen Serial Number: 118658

## 2017-04-03 NOTE — Progress Notes (Signed)
Pacemaker battery check in clinic (N/C). Estimated longevity <0.5 years (since 02/06/17). ROV with Device Clinic 05/13/17 for 3 month follow-up.

## 2017-04-24 ENCOUNTER — Other Ambulatory Visit: Payer: Self-pay | Admitting: Endocrinology

## 2017-04-25 ENCOUNTER — Telehealth: Payer: Self-pay | Admitting: Endocrinology

## 2017-04-25 NOTE — Telephone Encounter (Signed)
**  Remind patient they can make refill requests via MyChart**  Medication refill request (Name & Dosage):  levothyroxine (SYNTHROID, LEVOTHROID) 112 MCG tablet  Preferred pharmacy (Name & Address):    [facility] [place] [address] [phone] (Phone) [phone] (Fax)      Other comments (if applicable):   Patient stated that she does not have anymore refills for this Rx. Would like to know if she needs to be seen prior to this Rx getting filled. Please call patient and advise. OK to leave message

## 2017-05-09 ENCOUNTER — Ambulatory Visit (INDEPENDENT_AMBULATORY_CARE_PROVIDER_SITE_OTHER): Payer: Medicaid Other | Admitting: Physician Assistant

## 2017-05-30 ENCOUNTER — Ambulatory Visit (INDEPENDENT_AMBULATORY_CARE_PROVIDER_SITE_OTHER): Payer: No Typology Code available for payment source | Admitting: *Deleted

## 2017-05-30 DIAGNOSIS — R55 Syncope and collapse: Secondary | ICD-10-CM

## 2017-05-30 LAB — CUP PACEART INCLINIC DEVICE CHECK
Implantable Lead Implant Date: 20060119
Implantable Lead Implant Date: 20060119
Implantable Lead Location: 753859
Implantable Lead Location: 753860
Implantable Lead Model: 4469
Implantable Lead Serial Number: 457210
Implantable Pulse Generator Implant Date: 20060119
MDC IDC LEAD SERIAL: 439053
MDC IDC SESS DTM: 20180906123947
Pulse Gen Model: 1291
Pulse Gen Serial Number: 118658

## 2017-05-30 NOTE — Progress Notes (Signed)
Pacemaker check in clinic. Normal device function. Thresholds, sensing, impedances consistent with previous measurements. Device programmed to maximize longevity. SBR episodes recorded, pt denies syncope, reports occasional transient dizzy spells. No mode switch or high ventricular rates noted. Device programmed at appropriate safety margins. Histogram distribution appropriate for patient activity level. Device programmed to optimize intrinsic conduction. Battery at ERT since May 14, 2017. Patient education completed. Message sent to Hilma FavorsBillie Robinson [facility].

## 2017-06-04 ENCOUNTER — Encounter (INDEPENDENT_AMBULATORY_CARE_PROVIDER_SITE_OTHER): Payer: Self-pay | Admitting: Physician Assistant

## 2017-06-04 ENCOUNTER — Ambulatory Visit (INDEPENDENT_AMBULATORY_CARE_PROVIDER_SITE_OTHER): Payer: Self-pay | Admitting: Physician Assistant

## 2017-06-04 VITALS — BP 121/72 | HR 89 | Temp 98.9°F | Resp 18 | Ht 60.0 in | Wt 154.0 lb

## 2017-06-04 DIAGNOSIS — G8929 Other chronic pain: Secondary | ICD-10-CM

## 2017-06-04 DIAGNOSIS — R5383 Other fatigue: Secondary | ICD-10-CM

## 2017-06-04 DIAGNOSIS — K0889 Other specified disorders of teeth and supporting structures: Secondary | ICD-10-CM

## 2017-06-04 DIAGNOSIS — E039 Hypothyroidism, unspecified: Secondary | ICD-10-CM

## 2017-06-04 MED ORDER — CHLORHEXIDINE GLUCONATE 0.12 % MT SOLN: 15.0000 mL | Freq: Two times a day (BID) | OROMUCOSAL | 0 refills | Status: DC

## 2017-06-04 MED ORDER — NAPROXEN 500 MG PO TABS: 500.0000 mg | ORAL_TABLET | Freq: Two times a day (BID) | ORAL | 0 refills | Status: DC

## 2017-06-04 NOTE — Progress Notes (Signed)
Subjective:  Patient ID: Jean Rowe, female    DOB: 03/25/1986  Age: 31 y.o. MRN: [id number]  CC: establish care   HPI Jean Rowe is a 31 y.o. female with a medical history of anxiety, heart murmur, IBS, Hypothyroidism, Pacemaker, and chronic pain.  Presents with dental pain since 3 years, pain is progressing. Teeth are sensitive to chewing, drinking, and air.  Also has chronic pain x4 years. Previously diagnosed with fibromyalgia at an outside clinic. Pt does not think a work up was done to rule out other causes of her pain. Pain in upper back, upper chest, hips, antecubital fossa bilaterally, and popliteal bilaterally. Pain worse when body at rest. Better with physical activity. Takes Tylenol extra strength with little to no relief of pain.     Has hypothyroidism. Ran out of medications a few weeks ago. Has not had thyroid lab draw "for a long time". Endorses fatigue. Does not endorse any other symptoms   Outpatient Medications Prior to Visit  Medication Sig Dispense Refill  . levothyroxine (SYNTHROID, LEVOTHROID) 112 MCG tablet TAKE ONE TABLET BY MOUTH ONCE DAILY. MUST HAVE APPOINTMENT BEFORE FURTHER REFILLS. 90 tablet 0   No facility-administered medications prior to visit.      ROS Review of Systems  Constitutional: Negative for chills, fever and malaise/fatigue.  Eyes: Negative for blurred vision.  Respiratory: Negative for shortness of breath.   Cardiovascular: Negative for chest pain and palpitations.  Gastrointestinal: Negative for abdominal pain and nausea.  Genitourinary: Negative for dysuria and hematuria.  Musculoskeletal: Negative for joint pain and myalgias.  Skin: Negative for rash.  Neurological: Negative for tingling and headaches.  Psychiatric/Behavioral: Negative for depression. The patient is not nervous/anxious.     Objective:  BP 121/72 (BP Location: Left Arm, Patient Position: Sitting, Cuff Size: Normal)   Pulse 89   Temp 98.9 F (37.2 C)  (Oral)   Resp 18   Ht 5' (1.524 m)   Wt 154 lb (69.9 kg)   SpO2 99%   BMI 30.08 kg/m   BP/Weight 06/04/2017 02/23/2017 11/30/2016  Systolic BP 121 125 120  Diastolic BP 72 92 76  Wt. (Lbs) 154 145 150  BMI 30.08 28.32 29.29      Physical Exam  Constitutional: She is oriented to person, place, and time.  Well developed, well nourished, NAD, polite  HENT:  Head: Normocephalic and atraumatic.  Poor dentition, missing teeth, periodontal disease  Eyes: No scleral icterus.  Neck: Normal range of motion. Neck supple. Thyromegaly (very mild right and left sided thyromegaly ) present.  Cardiovascular: Normal rate, regular rhythm and normal heart sounds.   Pulmonary/Chest: Effort normal and breath sounds normal.  Musculoskeletal: She exhibits no edema.  TTP to the medial scapular margin bilaterally, upper chest, elbows, hips, knees  Neurological: She is alert and oriented to person, place, and time. No cranial nerve deficit. Coordination normal.  Skin: Skin is warm and dry. No rash noted. No erythema. No pallor.  Psychiatric: Her behavior is normal. Thought content normal.  constricted affect  Vitals reviewed.    Assessment & Plan:   1. Hypothyroidism, unspecified type - Thyroid Panel With TSH  2. Fatigue, unspecified type - ANA w/Reflex - Sedimentation Rate - Thyroid Panel With TSH - Comprehensive metabolic panel - CBC with Differential  3. Pain, dental - Begin chlorhexidine (PERIDEX) 0.12 % solution; Use as directed 15 mLs in the mouth or throat 2 (two) times daily.  Dispense: 120 mL; Refill: 0 - Ambulatory  referral to Dentistry - Begin naproxen (NAPROSYN) 500 MG tablet; Take 1 tablet (500 mg total) by mouth 2 (two) times daily with a meal.  Dispense: 30 tablet; Refill: 0  4. Other chronic pain - Begin naproxen (NAPROSYN) 500 MG tablet; Take 1 tablet (500 mg total) by mouth 2 (two) times daily with a meal.  Dispense: 30 tablet; Refill: 0   Meds ordered this encounter   Medications  . chlorhexidine (PERIDEX) 0.12 % solution    Sig: Use as directed 15 mLs in the mouth or throat 2 (two) times daily.    Dispense:  120 mL    Refill:  0    Order Specific Question:   Supervising Provider    Answer:   Quentin AngstJEGEDE, OLUGBEMIGA E [id number]]  . naproxen (NAPROSYN) 500 MG tablet    Sig: Take 1 tablet (500 mg total) by mouth 2 (two) times daily with a meal.    Dispense:  30 tablet    Refill:  0    Order Specific Question:   Supervising Provider    Answer:   Quentin AngstJEGEDE, OLUGBEMIGA E [[id number]]    Follow-up: Return in about 4 weeks (around 07/02/2017) for full physical.   Loletta Specteroger David Mallorie Norrod PA

## 2017-06-04 NOTE — Patient Instructions (Signed)
Please go to [email or web address] to look for free or low cost dental care in your area. [facility] [place] [phone].  Dental Pain Dental pain may be caused by many things, including:  Tooth decay (cavities or caries). Cavities expose the nerve of your tooth to air and hot or cold temperatures. This can cause pain or discomfort.  Abscess or infection. A dental abscess is a collection of infected pus from a bacterial infection in the inner part of the tooth (pulp). It usually occurs at the end of the tooth's root.  Injury.  An unknown reason (idiopathic).  Your pain may be mild or severe. It may only occur when:  You are chewing.  You are exposed to hot or cold temperature.  You are eating or drinking sugary foods or beverages, such as soda or candy.  Your pain may also be constant. Follow these instructions at home: Watch your dental pain for any changes. The following actions may help to lessen any discomfort that you are feeling:  Take medicines only as directed by your dentist.  If you were prescribed an antibiotic medicine, finish all of it even if you start to feel better.  Keep all follow-up visits as directed by your dentist. This is important.  Do not apply heat to the outside of your face.  Rinse your mouth or gargle with salt water if directed by your dentist. This helps with pain and swelling. ? You can make salt water by adding  tsp of salt to 1 cup of warm water.  Apply ice to the painful area of your face: ? Put ice in a plastic bag. ? Place a towel between your skin and the bag. ? Leave the ice on for 20 minutes, 2-3 times per day.  Avoid foods or drinks that cause you pain, such as: ? Very hot or very cold foods or drinks. ? Sweet or sugary foods or drinks.  Contact a health care provider if:  Your pain is not controlled with medicines.  Your symptoms are worse.  You have new symptoms. Get help right away if:  You  are unable to open your mouth.  You are having trouble breathing or swallowing.  You have a fever.  Your face, neck, or jaw is swollen. This information is not intended to replace advice given to you by your health care provider. Make sure you discuss any questions you have with your health care provider. Document Released: 09/10/2005 Document Revised: 01/19/2016 Document Reviewed: 09/06/2014 Elsevier Interactive Patient Education  2017 [employer]

## 2017-06-05 ENCOUNTER — Encounter (INDEPENDENT_AMBULATORY_CARE_PROVIDER_SITE_OTHER): Payer: Self-pay

## 2017-06-05 LAB — COMPREHENSIVE METABOLIC PANEL
ALT: 11 IU/L (ref 0–32)
AST: 19 IU/L (ref 0–40)
Albumin/Globulin Ratio: 1.6 (ref 1.2–2.2)
Albumin: 4.4 g/dL (ref 3.5–5.5)
Alkaline Phosphatase: 81 IU/L (ref 39–117)
BUN/Creatinine Ratio: 11 (ref 9–23)
BUN: 10 mg/dL (ref 6–20)
Bilirubin Total: 0.3 mg/dL (ref 0.0–1.2)
CALCIUM: 9.1 mg/dL (ref 8.7–10.2)
CO2: 21 mmol/L (ref 20–29)
Chloride: 103 mmol/L (ref 96–106)
Creatinine, Ser: 0.89 mg/dL (ref 0.57–1.00)
GFR, EST AFRICAN AMERICAN: 100 mL/min/{1.73_m2} (ref >59)
GFR, EST NON AFRICAN AMERICAN: 87 mL/min/{1.73_m2} (ref >59)
GLUCOSE: 66 mg/dL (ref 65–99)
Globulin, Total: 2.7 g/dL (ref 1.5–4.5)
Potassium: 4.1 mmol/L (ref 3.5–5.2)
Sodium: 141 mmol/L (ref 134–144)
TOTAL PROTEIN: 7.1 g/dL (ref 6.0–8.5)

## 2017-06-05 LAB — CBC WITH DIFFERENTIAL/PLATELET
BASOS ABS: 0 10*3/uL (ref 0.0–0.2)
BASOS: 0 %
EOS (ABSOLUTE): 0.1 10*3/uL (ref 0.0–0.4)
Eos: 1 %
HEMOGLOBIN: 13.4 g/dL (ref 11.1–15.9)
Hematocrit: 40.3 % (ref 34.0–46.6)
IMMATURE GRANS (ABS): 0 10*3/uL (ref 0.0–0.1)
IMMATURE GRANULOCYTES: 0 %
LYMPHS: 27 %
Lymphocytes Absolute: 1.9 10*3/uL (ref 0.7–3.1)
MCH: 32.1 pg (ref 26.6–33.0)
MCHC: 33.3 g/dL (ref 31.5–35.7)
MCV: 97 fL (ref 79–97)
MONOCYTES: 5 %
Monocytes Absolute: 0.3 10*3/uL (ref 0.1–0.9)
NEUTROS ABS: 4.6 10*3/uL (ref 1.4–7.0)
NEUTROS PCT: 67 %
Platelets: 362 10*3/uL (ref 150–379)
RBC: 4.17 x10E6/uL (ref 3.77–5.28)
RDW: 12.6 % (ref 12.3–15.4)
WBC: 6.9 10*3/uL (ref 3.4–10.8)

## 2017-06-05 LAB — SEDIMENTATION RATE: Sed Rate: 5 mm/hr (ref 0–32)

## 2017-06-05 LAB — THYROID PANEL WITH TSH
FREE THYROXINE INDEX: 2.4 (ref 1.2–4.9)
T3 Uptake Ratio: 28 % (ref 24–39)
T4, Total: 8.7 ug/dL (ref 4.5–12.0)
TSH: 2.4 u[IU]/mL (ref 0.450–4.500)

## 2017-06-05 LAB — ANA W/REFLEX: Anti Nuclear Antibody(ANA): NEGATIVE

## 2017-06-09 NOTE — Progress Notes (Signed)
 Cardiology Office Note Date:  06/13/2017  Patient ID:  Jean Rowe, DOB 12/23/1985, MRN [id number] PCP:  Hamrick, Maura L, MD  Cardiologist:  Dr. Croitoru    Chief Complaint: PPM at ERI  History of Present Illness: Jean Rowe is a 31 y.o. female with history of PPM w/hx of neurocardiogenic syncope with combined vasodepressor and cardioinhibitory components (4-5 second pause during tilt table test 2005), hypothyroidism, IBS, anxiety/panick attacks.  She comes today to be seen for Dr. Croitoru, at her last device check noted ERI was reached 05/14/17 and comes today to discuss generator change.  She was last seen by Dr. C in January, mentioned CP that was c/w musculoskeletal, as well as noting her PPM was likely needing gen change within the year, pt was counseled on her known neurocardiogenic syncope and symptom response/safety measures.  She is feeling well, though in the last couple months seems to be bothered more with dizziness upon standing.  No near syncope or syncope.  No CP, palpitations, no SOB or exertional intolerances.   She reports upon standing she will occassionally get quite lightheaded and need to sit to let it pass. This is not new, but every so often seems to happen more then usual.  We discussed hydration, not to avoid salt/salty foods and symptom recognition, safety.  She denies menses currently with IUD and denies possibility of pregnancy.  We discussed generator change procedure, risks,benefits and she would like to proceed.  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 Croitoru   Past Medical History:  Diagnosis Date  . Acne   . Anxiety    panic attacks  . Daily headache   . Heart murmur   . History of IBS   . History of kidney infection   . Hypothyroidism   . Neurocardiogenic syncope   . Neurocardiogenic syncope   . Orthopnoea   . Pacemaker 09/2004   for neurocardio  syncope  . Panic attacks   . Symptomatic bradycardia   . Thyroid disease     Past Surgical History:  Procedure Laterality Date  . DILATION AND CURETTAGE OF UTERUS  2013  . INSERT / REPLACE / REMOVE PACEMAKER  09/2004  . THYROIDECTOMY  11/04/2013  . THYROIDECTOMY N/A 11/04/2013   Procedure: THYROIDECTOMY;  Surgeon: Haywood M Ingram, MD;  Location: [facility] OR;  Service: General;  Laterality: N/A;    Current Outpatient Prescriptions  Medication Sig Dispense Refill  . chlorhexidine (PERIDEX) 0.12 % solution Use as directed 15 mLs in the mouth or throat 2 (two) times daily. 120 mL 0  . levothyroxine (SYNTHROID, LEVOTHROID) 112 MCG tablet TAKE ONE TABLET BY MOUTH ONCE DAILY. MUST HAVE APPOINTMENT BEFORE FURTHER REFILLS. 90 tablet 0  . naproxen (NAPROSYN) 500 MG tablet Take 1 tablet (500 mg total) by mouth 2 (two) times daily with a meal. 30 tablet 0   No current facility-administered medications for this visit.     Allergies:   Banana; Other; Latex; and Penicillins   Social History:  The patient  reports that she has been smoking Cigars.  She has smoked for the past 0.50 years. She has never used smokeless tobacco. She reports that she drinks alcohol. She reports that she does not use drugs.   Family History:  The patient's family history includes Heart disease in her mother; Heart failure in her maternal grandmother; Hypertension in her maternal grandfather and maternal grandmother; Thyroid disease in her   maternal grandmother, maternal uncle, and mother.  ROS:  Please see the history of present illness.  All other systems are reviewed and otherwise negative.   PHYSICAL EXAM:  VS:  BP 118/86   Pulse 76   Ht 5' (1.524 m)   Wt 152 lb (68.9 kg)   BMI 29.69 kg/m  BMI: Body mass index is 29.69 kg/m. Well nourished, well developed, in no acute distress  HEENT: normocephalic, atraumatic  Neck: no JVD, carotid bruits or masses Cardiac:  RRR; no significant murmurs, no rubs, or gallops Lungs:   CTA b/l, no wheezing, rhonchi or rales  Abd: soft, nontender MS: no deformity or atrophy Ext:  no edema  Skin: warm and dry, no rash Neuro:  No gross deficits appreciated Psych: euthymic mood, full affect  PPM site is stable, no tethering or discomfort   EKG:  Done today and reviewed by myself shows SR, baseline movement. 76bpm, PR 138ms, QRS 76msMaryland Specialty Surgery Center [place] MutuaKentuck eReuelJamaiCanada de los KentuckyAlL604ake GaD1916mlAsencion Part(8DortheWeKoreatKentuckyRockwell AutomStateApolinar JuHeloOSmith7 -ReuelJamaiUniversity of California-Santa BarRiceGaDeKentuckylto604n Coomb864msAsencion Part(DortheyWeWeKoreatKentuckyRockwell AutomStateApolinar [phone] Ob<MEASUREMEMendota Community HospSouthsMassachusetts MutKentuckyKentuckyuGames develoMaUnivers yReuelJamaiElCrGaDelton Coombesi294 Atlantic StrDallas Behavioral Healthcare HosKentuckypit604al L7El7838mFAsencion Part(DortheyPeach WeKoreatKentuckyRockwell AutomStateApolinar [phone] Ob<MEASUREMETemecula Ca Endoscopy Asc LP Dba United Surgery Center MurrietaNT>a (90IsidoKentuckyrFra1.91cEncompass HealtSheridan Va MeMassachusetts Mutu8257KentuckyKentucky Games develoDLakeway<MEASUKentuckyREM604ENT>eRe5073meAsencion PartDortheyQuaWeKoreatKentuckyRockwell AutomStateApolinar [phone] Ob<MEASUREMEHiggins General HospitalNT>aIsidoMidmichigan Medical CeMassachusetts Mutu942KentuckyKentucky Games deveFargo aReuelJamaiRKing RancKentuckyh C604GaDelto8157m Asencion PartDortheyWeWeKoreatKentuckyRockwell AutomStateApolinar [phone] Ob<MEASUREMEEye Surgery Center Of W[facility] LLCNT>a 4IsidoKentucRockledge Fl EndosMassachusetts Mutu9KentuckyKentucky5Games dePam Specialty Hospital fReuelJamaiLittleBelvedereGaDelton Coombesi570 Iroquois CoKentuckyunt604rysid6E3060mvAsencion PaDortheyVandWeKoreatKentuckyRockwell AutomStateApolinar [phone] Ob<MEASUREMEMinnesota Eye Institute Surgery Center LLCNT>a 5IsidoKentuckyrFSilver Spring OphthMassachusetts Mutu9KentuckyKentucky5Games devAvenir Behavi aReuelJamaiYCGaDelton Coombesi89Kentucky5 P604ierce Specialty S<MEASUREMEBhc Fairfax HospitalNT>3E(9IsidoKentucWest Palm Beach Va MeMassachusetts MuKentuckyKentuckytGames deveKoote iReuelJamaiSearsUnion DeGaDelton Coombesi577 ProspecKentuckyt A604S6ElvFa4477miAsencion PartDortheyMWeKoreatKentuckyRockwell AutomStateApolinar [phone] Ob<MEASUREMEBarbourville Arh HospitMemorMassachusetts Mutu441KentuckyKentucky Games develoLOrem mReuelJamaiRexMeadow GaDelton Coombesi19 Laurel LWellbridge Hospital Of San MarcoEKentuckynvi604ronment7044mlAsencion Part(DortheyLemoWeKoreatKentuckyRockwell AutomStateApolinar [phone] Ob<MEASUREMESouth Nassau Communities Hospital Off Campus Emergency DeptNT>a (90IsidoKentuckyrHealtheast Surgery Center MMassachusetts MKentuckyKentuckyuGames deKn ReuelJamaiFalling WKakGaDelton Coombesi7083 Andover StrEdwin Shaw RKentuckyeha604bilitat6847moAsencion PartDortheyWoodson WeKoreatKentuckyRockwell AutomStateApolinar JuHeOran380 181 8320gMilana Ob<MEASUREMEIndiana University Health Bloomington HospitalNT>a (3IsidoMontgomeMassachusetts Mutu43 KentuckyKentuckyBGames develoBoston Outpatient S gReuelJamaiFayTillGaDelton CoKentuckyomb604esi9Elv4129maAsencion PartDortheyWeKoreatKentuckyRockwell AutomStateApolinar [phone] Ob<MEASUREMEChestnut Hill HospiFort Loudoun MeMassachusetts MuKentu604ckyKent2052mcAsencion Part(8DortheyGranWeKoreatKentuckyRockwell AutomStateApolinar [phone] Ob<MEASUREMESt. Charles Surgical HospitalNT>a 6IsidoKentHawthorn SuMassachusetts Mutu189 East BKeKentuckyntu604ckyKent605mcAsencion Part(DortheWeKoreatKentuckyRockwell AutomStateApolinar [phone] Ob<MEASUREMEOakes Community HospitalNT>a [facility] [place] Mutu377KentuckyKentucky Games develDriscoll C lReuelJamaiOroville Bermuda GaDelton CoomKentuckybes604i30 New8818maAsencion PartDortheyKWeKoreatKentuckyRockwell AutomStateApolinar [phone] Ob<MEASUREMEBacon County HospitalNT>a 2IsidoKentuckyrFrSouth Florida Evaluation And TreaMassachusetts MutuKentuckyKentucky7Games develNorth Subur nReuelJamaiLuClevGaDelton CoombKentuckyesi60437 Coro3344maAsencion PartDortheWeKoreatKentuckyRockwell AutomStateApolinar [phone] Ob<MEASUREMEUniversity Pointe Surgical HospitalNT>a 6IsidoKentuckyrFra1.Rivers Edge HospiMassachusetts MutuKentuckyKentucky8Games develoKaRockefeller U vReuelJamaiArbMarGaDelton Coombesi7771 Brown AlKentuckylia604nce Hea2434mtAsencion PartDortheyLittle BWeKoreatKentuckyRockwell AutomStateApolinar JuHeFour [phone] Ob<MEASUREMETurning Point HospitalNT>aIsidoKentuckyrFra1.91cKindred Hos585-4Vance Thompson VisiThe Portland Clinic SurMassachusetts Mutu995KentuckyKentucky7Game045Encompass [facility] Of MeV nReuelJamaiPreWest HaGaDelton Coombesi7540 RKentuckyoos604evelt 92734mlAsencion PartDortheyTuolumWeKoreatKentuckyRockwell AutomStateApolinar JuHeloM6076080127eMilana Ob<MEASUREMETennova Healthcare - ShelbyvilleNT>a 9IsidoKentuckyrFra1.91cUniversity Of Maryland Harford MemorMassachusetts Mutu9753 KentuckyKentuckyBGames de nReuelJamaiEagleton VilHGaDelton Coombesi56 Country Legacy Good Samaritan Medical CenteEnvironmental conKentuckysuC604entrWil2575miAsencion PartDortWeKoreatKentuckyRockwell AutomStateApolinar [phone] Ob<MEASUREMEMid-Hudson Valley Division Of West[facility] Medical CenterNT>a 9IsidoKentuckyrFrSmyth County CommunMassachusetts Mutu48 NorthKentuckyKentucky Games develoELanai mReuelJamaiKentuckyGre604enfInde103meAsencion PartDortheyGoWeKoreatKentuckyRockwell AutomStateApolinar [phone] Ob<MEASUREMEPromise Hospital Of DallasNT>a 9IsidoKentuckyrFra1.91cPenOutpatienMassachusetts Mutu5KentuckyKentucky8Games develoKiWest P kReuelJamaiAirport Road AddiNioGaDelton CooKentuckymbe604si801 F4743msAsencion Part(9DortheyLomaWeKoreatKentuckyRockwell AutomStateApolinar JuHeloForest [phone] Milana Ob<MEASUREMERochester Psychiatric CenterNT>a (2IsidoKentuckyrFra1.91cWilshire CenteCampus SurgerMassachusetts MKentuckyKentuckyuGames develTacom GReuelJamaiWalterMagGaDelton CoombesiKentucky192604 Windin5734m Asencion Part(DortheyHouWeKoreatKentuckyRockwell AutomStateApolinar [phone] Ob<MEASUREMEThe [facility] Health LLCNT>a (9ISt PatrMassachusetts Mutu70 SKentuckyKentuckyuGames develoBNorth Oak Regio lReuelJamaiNavDunnGaDelton Coombesi19 E. Lookout Sea3ElvFabiaGulf Kentuckyoa604st Vete7582maAsencion PartDortheWeKoreatKentuckyRockwell AutomStateApolinar [phone] Ob<MEASUREMEKaiser Fnd Hosp - AnaheimNT>a 7IsidoKentuckyrFra1.91cHelen M Simpson RRedwood MemorMassachusetts Mutu2KentuckyKentucky6Games develoEast Morgan County oReuelJamKentuckyaiS604Schoone5339maAsencion Part(3DorthWeKoreatKentuckyRockwell AutomStateApolinar JuHe([phone] Ob<MEASUREMEUpper Valley Medical CenterNT>a 5IsidoKeDrexel Center For DigeMassachusetts Mutu884 NoKentuckyKentuckyrGames develRichmond University Medical Center - Bayley Seton CampuArchie Balboaher Ave.avena Summit S423-3Pine[place] Orchard SurgeryMercJenel [phone] rm threshold testing, impedances and sensing are good, AP 4%, VP 2% I discussed with BSCi representative and "rate drop" program/response remains functioning despite ERI  06/17/08: TTE Normal LV systolic function Trace MR/TR  Recent Labs: 08/06/2016: Magnesium 2.0 06/04/2017: ALT 11; BUN 10; Creatinine, Ser 0.89; Hemoglobin 13.4; Platelets 362; Potassium 4.1; Sodium 141; TSH 2.400  No results found for requested labs within last 8760 hours.   Estimated Creatinine Clearance: 79.4 mL/min (by C-G formula based on SCr of 0.89 mg/dL).   Wt Readings from Last 3 Encounters:  06/13/17 152 lb (68.9 kg)  06/04/17 154 lb (69.9 kg)  02/23/17 145 lb (65.8 kg)     Other studies reviewed: Additional studies/records reviewed today include: summarized above  ASSESSMENT AND PLAN:  1. PPM     ERI has been reached     generator change procedre, risks, beneofts were discussed and patient would like to proceed     will schedule at next available with Dr. C  2. Neurocardiogenic syncope (mixed response)     We discussed symptom recognition, hydration, and safety as well as not to avoid salt/alty foods     She has not had syncope    Disposition: F/u with routine post generator change followup  Current medicines are reviewed at length with the patient today.  The patient did not have any concerns regarding medicines.  Signed, Renee Ursuy, PA-C 06/13/2017 12:19 PM     [facility] [address] [place]  [address] [phone] (office)  [phone] (fax)

## 2017-06-13 ENCOUNTER — Ambulatory Visit (INDEPENDENT_AMBULATORY_CARE_PROVIDER_SITE_OTHER): Payer: No Typology Code available for payment source | Admitting: Physician Assistant

## 2017-06-13 ENCOUNTER — Telehealth: Payer: Self-pay | Admitting: *Deleted

## 2017-06-13 ENCOUNTER — Encounter: Payer: Self-pay | Admitting: *Deleted

## 2017-06-13 VITALS — BP 118/86 | HR 76 | Ht 60.0 in | Wt 152.0 lb

## 2017-06-13 DIAGNOSIS — Z45010 Encounter for checking and testing of cardiac pacemaker pulse generator [battery]: Secondary | ICD-10-CM

## 2017-06-13 DIAGNOSIS — R55 Syncope and collapse: Secondary | ICD-10-CM

## 2017-06-13 DIAGNOSIS — Z01818 Encounter for other preprocedural examination: Secondary | ICD-10-CM

## 2017-06-13 NOTE — Telephone Encounter (Signed)
SPOKE TO PT  ABOUT  GEN CHANGE ON 06-19-2017 WITH DR. C AT 3:30 PM    PT WILL COME IN FOR LABS ON  06-14-17   PT WILL ALSO PICK UP LETTER  WITH INSTRUCTIONS AND ANTISEPTIC SCRUB FOR GEN CHANGE  AND BE CONTACTED BACK BY SCHEDULER  FOR FOLLOW UP  APPTS.

## 2017-06-14 ENCOUNTER — Other Ambulatory Visit: Payer: No Typology Code available for payment source | Admitting: *Deleted

## 2017-06-14 DIAGNOSIS — Z01818 Encounter for other preprocedural examination: Secondary | ICD-10-CM

## 2017-06-14 NOTE — Progress Notes (Signed)
Thank you Renee! MCr

## 2017-06-15 LAB — BASIC METABOLIC PANEL
BUN/Creatinine Ratio: 10 (ref 9–23)
BUN: 8 mg/dL (ref 6–20)
CALCIUM: 9.2 mg/dL (ref 8.7–10.2)
CHLORIDE: 100 mmol/L (ref 96–106)
CO2: 21 mmol/L (ref 20–29)
Creatinine, Ser: 0.77 mg/dL (ref 0.57–1.00)
GFR calc Af Amer: 119 mL/min/{1.73_m2} (ref >59)
GFR calc non Af Amer: 103 mL/min/{1.73_m2} (ref >59)
GLUCOSE: 72 mg/dL (ref 65–99)
Potassium: 4.2 mmol/L (ref 3.5–5.2)
Sodium: 137 mmol/L (ref 134–144)

## 2017-06-15 LAB — CBC
HEMATOCRIT: 37.7 % (ref 34.0–46.6)
HEMOGLOBIN: 12.7 g/dL (ref 11.1–15.9)
MCH: 32.3 pg (ref 26.6–33.0)
MCHC: 33.7 g/dL (ref 31.5–35.7)
MCV: 96 fL (ref 79–97)
Platelets: 315 10*3/uL (ref 150–379)
RBC: 3.93 x10E6/uL (ref 3.77–5.28)
RDW: 12.7 % (ref 12.3–15.4)
WBC: 7.2 10*3/uL (ref 3.4–10.8)

## 2017-06-15 LAB — APTT: aPTT: 32 s (ref 24–33)

## 2017-06-19 ENCOUNTER — Ambulatory Visit (HOSPITAL_COMMUNITY)
Admission: RE | Disposition: A | Discharge status: Home / Self Care | Payer: Self-pay | Source: Ambulatory Visit | Attending: Cardiovascular Disease

## 2017-06-19 ENCOUNTER — Ambulatory Visit (HOSPITAL_COMMUNITY)
Admission: RE | Admit: 2017-06-19 | Discharge: 2017-06-19 | Disposition: A | Discharge status: Home / Self Care | Payer: No Typology Code available for payment source | Source: Ambulatory Visit | Attending: Cardiovascular Disease | Admitting: Cardiovascular Disease

## 2017-06-19 DIAGNOSIS — Z45010 Encounter for checking and testing of cardiac pacemaker pulse generator [battery]: Secondary | ICD-10-CM

## 2017-06-19 DIAGNOSIS — Z79899 Other long term (current) drug therapy: Secondary | ICD-10-CM | POA: Insufficient documentation

## 2017-06-19 DIAGNOSIS — F17290 Nicotine dependence, other tobacco product, uncomplicated: Secondary | ICD-10-CM | POA: Insufficient documentation

## 2017-06-19 DIAGNOSIS — F419 Anxiety disorder, unspecified: Secondary | ICD-10-CM | POA: Insufficient documentation

## 2017-06-19 DIAGNOSIS — E039 Hypothyroidism, unspecified: Secondary | ICD-10-CM | POA: Insufficient documentation

## 2017-06-19 DIAGNOSIS — Z880 Allergy status to penicillin: Secondary | ICD-10-CM | POA: Insufficient documentation

## 2017-06-19 DIAGNOSIS — R55 Syncope and collapse: Secondary | ICD-10-CM

## 2017-06-19 DIAGNOSIS — Z91040 Latex allergy status: Secondary | ICD-10-CM | POA: Insufficient documentation

## 2017-06-19 DIAGNOSIS — K589 Irritable bowel syndrome without diarrhea: Secondary | ICD-10-CM | POA: Insufficient documentation

## 2017-06-19 HISTORY — PX: PPM GENERATOR CHANGEOUT: EP1233

## 2017-06-19 LAB — SURGICAL PCR SCREEN
MRSA, PCR: NEGATIVE
Staphylococcus aureus: POSITIVE — AB

## 2017-06-19 LAB — PREGNANCY, URINE: Preg Test, Ur: NEGATIVE

## 2017-06-19 SURGERY — PPM GENERATOR CHANGEOUT
Anesthesia: LOCAL

## 2017-06-19 MED ORDER — ACETAMINOPHEN 325 MG PO TABS: 325.0000 mg | ORAL_TABLET | ORAL | Status: DC | PRN

## 2017-06-19 MED ORDER — MIDAZOLAM HCL 5 MG/5ML IJ SOLN
INTRAMUSCULAR | Status: AC
  Filled 2017-06-19: qty 5

## 2017-06-19 MED ORDER — LIDOCAINE HCL 2 % IJ SOLN
INTRAMUSCULAR | Status: AC
  Filled 2017-06-19: qty 10

## 2017-06-19 MED ORDER — SODIUM CHLORIDE 0.9 % IR SOLN
80.0000 mg | Status: AC
  Administered 2017-06-19: 80 mg

## 2017-06-19 MED ORDER — SODIUM CHLORIDE 0.9 % IV SOLN
INTRAVENOUS | Status: DC
  Administered 2017-06-19: 14:00:00 via INTRAVENOUS

## 2017-06-19 MED ORDER — MUPIROCIN 2 % EX OINT
TOPICAL_OINTMENT | CUTANEOUS | Status: AC
  Administered 2017-06-19: 1
  Filled 2017-06-19: qty 22

## 2017-06-19 MED ORDER — FENTANYL CITRATE (PF) 100 MCG/2ML IJ SOLN
INTRAMUSCULAR | Status: DC | PRN
Start: 2017-06-19 — End: 2017-06-19
  Administered 2017-06-19 (×2): 25 ug via INTRAVENOUS

## 2017-06-19 MED ORDER — ONDANSETRON HCL 4 MG/2ML IJ SOLN: 4.0000 mg | Freq: Four times a day (QID) | INTRAMUSCULAR | Status: DC | PRN

## 2017-06-19 MED ORDER — FENTANYL CITRATE (PF) 100 MCG/2ML IJ SOLN
INTRAMUSCULAR | Status: AC
  Filled 2017-06-19: qty 2

## 2017-06-19 MED ORDER — MUPIROCIN 2 % EX OINT: 1.0000 "application " | TOPICAL_OINTMENT | Freq: Once | CUTANEOUS | Status: DC

## 2017-06-19 MED ORDER — VANCOMYCIN HCL IN DEXTROSE 1-5 GM/200ML-% IV SOLN
INTRAVENOUS | Status: AC
  Filled 2017-06-19: qty 200

## 2017-06-19 MED ORDER — VANCOMYCIN HCL IN DEXTROSE 1-5 GM/200ML-% IV SOLN
1000.0000 mg | INTRAVENOUS | Status: AC
Start: 2017-06-19 — End: 2017-06-19
  Administered 2017-06-19: 1000 mg via INTRAVENOUS

## 2017-06-19 MED ORDER — CHLORHEXIDINE GLUCONATE 4 % EX LIQD: 60.0000 mL | Freq: Once | CUTANEOUS | Status: DC

## 2017-06-19 MED ORDER — MIDAZOLAM HCL 5 MG/5ML IJ SOLN
INTRAMUSCULAR | Status: DC | PRN
  Administered 2017-06-19 (×3): 1 mg via INTRAVENOUS

## 2017-06-19 MED ORDER — SODIUM CHLORIDE 0.9 % IR SOLN
Status: AC
  Filled 2017-06-19: qty 2

## 2017-06-19 MED ORDER — LIDOCAINE HCL (PF) 1 % IJ SOLN
INTRAMUSCULAR | Status: DC | PRN
  Administered 2017-06-19: 10 mL

## 2017-06-19 SURGICAL SUPPLY — 4 items
CABLE SURGICAL S-101-97-12 (CABLE) ×1 IMPLANT
PACEMAKER ACCOLADE DR-EL (Pacemaker) ×1 IMPLANT
PAD DEFIB LIFELINK (PAD) ×1 IMPLANT
TRAY PACEMAKER INSERTION (PACKS) ×1 IMPLANT

## 2017-06-19 NOTE — Op Note (Signed)
Procedure report  Procedure performed:  1. Dual-chamber pacemaker generator changeout  2. Light sedation  Reason for procedure:  1. Device generator at elective replacement interval  2. Neurocardiogenic syncope with profound cardioinhibitory component Procedure performed by:  Thurmon Fair, MD  Complications:  None  Estimated blood loss:  <5 mL  Medications administered during procedure:  Vancomycin 1 g intravenously, lidocaine 2% 20 mL locally, fentanyl 50 mcg intravenously, Versed 3 mg intravenously Device details:   Scientist, research (medical) Accolade MRI EL DR model number L331, serial number [id number] Right atrial lead (chronic) Guidant, model number [id number], serial [id number] (implanted 10/12/2004) Right ventricular lead (chronic)  Guidant, model number 4456, serial number [id number] (implanted .10/12/2004)  Explanted generator [employer],  model number [id number], serial number  [id number] (implanted 10/12/2004)  Procedure details:  After the risks and benefits of the procedure were discussed the patient provided informed consent. She was brought to the cardiac catheter lab in the fasting state. The patient was prepped and draped in usual sterile fashion. Local anesthesia with 1% lidocaine was administered to to the left infraclavicular area. A 5-6cm horizontal incision was made parallel with and 2-3 cm caudal to the left clavicle, in the area of an old scar. An older scar was seen closer to the left clavicle. Using minimal electrocautery and mostly sharp and blunt dissection the prepectoral pocket was opened carefully to avoid injury to the loops of chronic leads. Extensive dissection was no necessary. The device was explanted. The pocket was carefully inspected for hemostasis and flushed with copious amounts of antibiotic solution.  The leads were disconnected from the old generator and testing of the lead parameters later showed excellent values. The new generator was connected  to the chronic leads, with appropriate pacing noted.   The entire system was then carefully inserted in the pocket with care been taking that the leads and device assumed a comfortable position without pressure on the incision. Great care was taken that the leads be located deep to the generator. The pocket was then closed in layers using 2 layers of 2-0 Vicryl and cutaneous staples after which a sterile dressing was applied.   At the end of the procedure the following lead parameters were encountered:   Right atrial lead sensed P waves 6 mV, impedance 394 ohms, threshold 0.5 at 0.4 ms pulse width.  Right ventricular lead sensed R waves  12.5 mV, impedance 415 ohms, threshold 0.5 at 0.4 ms pulse width.   Thurmon Fair, MD, [facility] [phone] office [phone] pager

## 2017-06-19 NOTE — Interval H&P Note (Signed)
History and Physical Interval Note:  06/19/2017 3:14 PM  Jean Rowe  has presented today for surgery, with the diagnosis of eol  The various methods of treatment have been discussed with the patient and family. After consideration of risks, benefits and other options for treatment, the patient has consented to  Procedure(s): PPM GENERATOR CHANGEOUT (N/A) as a surgical intervention .  The patient's history has been reviewed, patient examined, no change in status, stable for surgery.  I have reviewed the patient's chart and labs.  Questions were answered to the patient's satisfaction.     Carson Meche

## 2017-06-19 NOTE — Progress Notes (Signed)
Pt PCR positive for staph. Pt aware. Teaching done verbally and visually and pamphlet given to pt with instructions.  Mupirocin given to pt to take home

## 2017-06-19 NOTE — Discharge Instructions (Signed)
Supplemental Discharge Instructions for  Pacemaker/Defibrillator Patients  ActivitY NO DRIVING today. May drive starting tomorrow. DO wear your seatbelt, even if it crosses over the pacemaker site.  WOUND CARE - Keep the wound area clean and dry.  Remove the dressing the day after you return home (usually 48 hours after the procedure). - DO NOT SUBMERGE UNDER WATER UNTIL FULLY HEALED (no tub baths, hot tubs, swimming pools, etc.).  - You  may shower or take a sponge bath after the dressing is removed. DO NOT SOAK the area and do not allow the shower to directly spray on the site. - If you have staples, these will be removed in the office in 7-14 days. - If you have tape/steri-strips on your wound, these will fall off; do not pull them off prematurely.   - No bandage is needed on the site.  DO  NOT apply any creams, oils, or ointments to the wound area. - If you notice any drainage or discharge from the wound, any swelling, excessive redness or bruising at the site, or if you develop a fever > 101? F after you are discharged home, call the office at once.  Special Instructions - You are still able to use cellular telephones.  Avoid carrying your cellular phone near your device. - When traveling through airports, show security personnel your identification card to avoid being screened in the metal detectors.  - Avoid arc welding equipment, MRI testing (magnetic resonance imaging), TENS units (transcutaneous nerve stimulators).  Call the office for questions about other devices. - Avoid electrical appliances that are in poor condition or are not properly grounded. - Microwave ovens are safe to be near or to operate.

## 2017-06-19 NOTE — H&P (View-Only) (Signed)
Cardiology Office Note Date:  06/13/2017  Patient ID:  Jean Rowe, Jean Rowe Feb 13, 1986, MRN [id number] PCP:  Ailene Ravel, MD  Cardiologist:  Dr. Royann Shivers    Chief Complaint: PPM at ERI  History of Present Illness: Jean Rowe is a 31 y.o. female with history of PPM w/hx of neurocardiogenic syncope with combined vasodepressor and cardioinhibitory components (4-5 second pause during tilt table test 2005), hypothyroidism, IBS, anxiety/panick attacks.  She comes today to be seen for Dr. Royann Shivers, at her last device check noted ERI was reached 05/14/17 and comes today to discuss generator change.  She was last seen by Dr. Salena Saner in January, mentioned CP that was c/w musculoskeletal, as well as noting her PPM was likely needing gen change within the year, pt was counseled on her known neurocardiogenic syncope and symptom response/safety measures.  She is feeling well, though in the last couple months seems to be bothered more with dizziness upon standing.  No near syncope or syncope.  No CP, palpitations, no SOB or exertional intolerances.   She reports upon standing she will occassionally get quite lightheaded and need to sit to let it pass. This is not new, but every so often seems to happen more then usual.  We discussed hydration, not to avoid salt/salty foods and symptom recognition, safety.  She denies menses currently with IUD and denies possibility of pregnancy.  We discussed generator change procedure, risks,benefits and she would like to proceed.  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 Royann Shivers   Past Medical History:  Diagnosis Date  . Acne   . Anxiety    panic attacks  . Daily headache   . Heart murmur   . History of IBS   . History of kidney infection   . Hypothyroidism   . Neurocardiogenic syncope   . Neurocardiogenic syncope   . Orthopnoea   . Pacemaker 09/2004   for neurocardio  syncope  . Panic attacks   . Symptomatic bradycardia   . Thyroid disease     Past Surgical History:  Procedure Laterality Date  . DILATION AND CURETTAGE OF UTERUS  2013  . INSERT / REPLACE / REMOVE PACEMAKER  09/2004  . THYROIDECTOMY  11/04/2013  . THYROIDECTOMY N/A 11/04/2013   Procedure: THYROIDECTOMY;  Surgeon: Ernestene Mention, MD;  Location: [facility] OR;  Service: General;  Laterality: N/A;    Current Outpatient Prescriptions  Medication Sig Dispense Refill  . chlorhexidine (PERIDEX) 0.12 % solution Use as directed 15 mLs in the mouth or throat 2 (two) times daily. 120 mL 0  . levothyroxine (SYNTHROID, LEVOTHROID) 112 MCG tablet TAKE ONE TABLET BY MOUTH ONCE DAILY. MUST HAVE APPOINTMENT BEFORE FURTHER REFILLS. 90 tablet 0  . naproxen (NAPROSYN) 500 MG tablet Take 1 tablet (500 mg total) by mouth 2 (two) times daily with a meal. 30 tablet 0   No current facility-administered medications for this visit.     Allergies:   Banana; Other; Latex; and Penicillins   Social History:  The patient  reports that she has been smoking Cigars.  She has smoked for the past 0.50 years. She has never used smokeless tobacco. She reports that she drinks alcohol. She reports that she does not use drugs.   Family History:  The patient's family history includes Heart disease in her mother; Heart failure in her maternal grandmother; Hypertension in her maternal grandfather and maternal grandmother; Thyroid disease in her  maternal grandmother, maternal uncle, and mother.  ROS:  Please see the history of present illness.  All other systems are reviewed and otherwise negative.   PHYSICAL EXAM:  VS:  BP 118/86   Pulse 76   Ht 5' (1.524 m)   Wt 152 lb (68.9 kg)   BMI 29.69 kg/m  BMI: Body mass index is 29.69 kg/m. Well nourished, well developed, in no acute distress  HEENT: normocephalic, atraumatic  Neck: no JVD, carotid bruits or masses Cardiac:  RRR; no significant murmurs, no rubs, or gallops Lungs:   CTA b/l, no wheezing, rhonchi or rales  Abd: soft, nontender MS: no deformity or atrophy Ext:  no edema  Skin: warm and dry, no rash Neuro:  No gross deficits appreciated Psych: euthymic mood, full affect  PPM site is stable, no tethering or discomfort   EKG:  Done today and reviewed by myself shows SR, baseline movement. 76bpm, PR 138ms, QRS 76msMaryland Specialty Surgery Center [place] MutuaKentuck eReuelJamaiCanada de los KentuckyAlL604ake GaD1916mlAsencion Part(8DortheWeKoreatKentuckyRockwell AutomStateApolinar JuHeloOSmith7 -ReuelJamaiUniversity of California-Santa BarRiceGaDeKentuckylto604n Coomb864msAsencion Part(DortheyWeWeKoreatKentuckyRockwell AutomStateApolinar [phone] Ob<MEASUREMEMendota Community HospSouthsMassachusetts MutKentuckyKentuckyuGames develoMaUnivers yReuelJamaiElCrGaDelton Coombesi294 Atlantic StrDallas Behavioral Healthcare HosKentuckypit604al L7El7838mFAsencion Part(DortheyPeach WeKoreatKentuckyRockwell AutomStateApolinar [phone] Ob<MEASUREMETemecula Ca Endoscopy Asc LP Dba United Surgery Center MurrietaNT>a (90IsidoKentuckyrFra1.91cEncompass HealtSheridan Va MeMassachusetts Mutu8257KentuckyKentucky Games develoDLakeway<MEASUKentuckyREM604ENT>eRe5073meAsencion PartDortheyQuaWeKoreatKentuckyRockwell AutomStateApolinar [phone] Ob<MEASUREMEHiggins General HospitalNT>aIsidoMidmichigan Medical CeMassachusetts Mutu942KentuckyKentucky Games deveFargo aReuelJamaiRKing RancKentuckyh C604GaDelto8157m Asencion PartDortheyWeWeKoreatKentuckyRockwell AutomStateApolinar [phone] Ob<MEASUREMEEye Surgery Center Of W[facility] LLCNT>a 4IsidoKentucRockledge Fl EndosMassachusetts Mutu9KentuckyKentucky5Games dePam Specialty Hospital fReuelJamaiLittleBelvedereGaDelton Coombesi570 Iroquois CoKentuckyunt604rysid6E3060mvAsencion PaDortheyVandWeKoreatKentuckyRockwell AutomStateApolinar [phone] Ob<MEASUREMEMinnesota Eye Institute Surgery Center LLCNT>a 5IsidoKentuckyrFSilver Spring OphthMassachusetts Mutu9KentuckyKentucky5Games devAvenir Behavi aReuelJamaiYCGaDelton Coombesi89Kentucky5 P604ierce Specialty S<MEASUREMEBhc Fairfax HospitalNT>3E(9IsidoKentucWest Palm Beach Va MeMassachusetts MuKentuckyKentuckytGames deveKoote iReuelJamaiSearsUnion DeGaDelton Coombesi577 ProspecKentuckyt A604S6ElvFa4477miAsencion PartDortheyMWeKoreatKentuckyRockwell AutomStateApolinar [phone] Ob<MEASUREMEBarbourville Arh HospitMemorMassachusetts Mutu441KentuckyKentucky Games develoLOrem mReuelJamaiRexMeadow GaDelton Coombesi19 Laurel LWellbridge Hospital Of San MarcoEKentuckynvi604ronment7044mlAsencion Part(DortheyLemoWeKoreatKentuckyRockwell AutomStateApolinar [phone] Ob<MEASUREMESouth Nassau Communities Hospital Off Campus Emergency DeptNT>a (90IsidoKentuckyrHealtheast Surgery Center MMassachusetts MKentuckyKentuckyuGames deKn ReuelJamaiFalling WKakGaDelton Coombesi7083 Andover StrEdwin Shaw RKentuckyeha604bilitat6847moAsencion PartDortheyWoodson WeKoreatKentuckyRockwell AutomStateApolinar JuHeOran380 181 8320gMilana Ob<MEASUREMEIndiana University Health Bloomington HospitalNT>a (3IsidoMontgomeMassachusetts Mutu43 KentuckyKentuckyBGames develoBoston Outpatient S gReuelJamaiFayTillGaDelton CoKentuckyomb604esi9Elv4129maAsencion PartDortheyWeKoreatKentuckyRockwell AutomStateApolinar [phone] Ob<MEASUREMEChestnut Hill HospiFort Loudoun MeMassachusetts MuKentu604ckyKent2052mcAsencion Part(8DortheyGranWeKoreatKentuckyRockwell AutomStateApolinar JuHe([phone] Ob<MEASUREMESt. Charles Surgical HospitalNT>a 6IsidoKentHawthorn SuMassachusetts Mutu189 East BKeKentuckyntu604ckyKent605mcAsencion Part(DortheWeKoreatKentuckyRockwell AutomStateApolinar [phone] Ob<MEASUREMEOakes Community HospitalNT>a [facility] [place] Mutu377KentuckyKentucky Games develDriscoll C lReuelJamaiOroville Bermuda GaDelton CoomKentuckybes604i30 New8818maAsencion PartDortheyKWeKoreatKentuckyRockwell AutomStateApolinar [phone] Ob<MEASUREMEBacon County HospitalNT>a 2IsidoKentuckyrFrSouth Florida Evaluation And TreaMassachusetts MutuKentuckyKentucky7Games develNorth Subur nReuelJamaiLuClevGaDelton CoombKentuckyesi60437 Coro3344maAsencion PartDortheWeKoreatKentuckyRockwell AutomStateApolinar [phone] Ob<MEASUREMEUniversity Pointe Surgical HospitalNT>a 6IsidoKentuckyrFra1.Rivers Edge HospiMassachusetts MutuKentuckyKentucky8Games develoKaRockefeller U vReuelJamaiArbMarGaDelton Coombesi7771 Brown AlKentuckylia604nce Hea2434mtAsencion PartDortheyLittle BWeKoreatKentuckyRockwell AutomStateApolinar JuHeFour [phone] Ob<MEASUREMETurning Point HospitalNT>aIsidoKentuckyrFra1.91cKindred Hos585-4Vance Thompson VisiThe Portland Clinic SurMassachusetts Mutu995KentuckyKentucky7Game045Encompass Health Rehabilitation Hospital Of MeV nReuelJamaiPreWest HaGaDelton Coombesi7540 RKentuckyoos604evelt 92734mlAsencion PartDortheyTuolumWeKoreatKentuckyRockwell AutomStateApolinar JuHeloM6076080127eMilana Ob<MEASUREMETennova Healthcare - ShelbyvilleNT>a 9IsidoKentuckyrFra1.91cUniversity Of Maryland Harford MemorMassachusetts Mutu9753 KentuckyKentuckyBGames de nReuelJamaiEagleton VilHGaDelton Coombesi56 Country Legacy Good Samaritan Medical CenteEnvironmental conKentuckysuC604entrWil2575miAsencion PartDortWeKoreatKentuckyRockwell AutomStateApolinar [phone] Ob<MEASUREMEMid-Hudson Valley Division Of Westchester Medical CenterNT>a 9IsidoKentuckyrFrSmyth County CommunMassachusetts Mutu48 NorthKentuckyKentucky Games develoELanai mReuelJamaiKentuckyGre604enfInde103meAsencion PartDortheyGoWeKoreatKentuckyRockwell AutomStateApolinar [phone] Ob<MEASUREMEPromise Hospital Of DallasNT>a 9IsidoKentuckyrFra1.91cPenOutpatienMassachusetts Mutu5KentuckyKentucky8Games develoKiWest P kReuelJamaiAirport Road AddiNioGaDelton CooKentuckymbe604si801 F4743msAsencion Part(9DortheyLomaWeKoreatKentuckyRockwell AutomStateApolinar JuHeloForest [phone] Milana Ob<MEASUREMERochester Psychiatric CenterNT>a (2IsidoKentuckyrFra1.91cWilshire CenteCampus SurgerMassachusetts MKentuckyKentuckyuGames develTacom GReuelJamaiWalterMagGaDelton CoombesiKentucky192604 Windin5734m Asencion Part(DortheyHouWeKoreatKentuckyRockwell AutomStateApolinar [phone] Ob<MEASUREMEThe [facility] Center For Digestive Health LLCNT>a (9ISt PatrMassachusetts Mutu70 SKentuckyKentuckyuGames develoBNorth Oak Regio lReuelJamaiNavDunnGaDelton Coombesi19 E. Lookout Sea3ElvFabiaGulf Kentuckyoa604st Vete7582maAsencion PartDortheWeKoreatKentuckyRockwell AutomStateApolinar [phone] Ob<MEASUREMEKaiser Fnd Hosp - AnaheimNT>a 7IsidoKentuckyrFra1.91cHelen M Simpson RRedwood MemorMassachusetts Mutu2KentuckyKentucky6Games develoEast Morgan County oReuelJamKentuckyaiS604Schoone5339maAsencion Part(3DorthWeKoreatKentuckyRockwell AutomStateApolinar JuHe([phone] Ob<MEASUREMEUpper Valley Medical CenterNT>a 5IsidoKeDrexel Center For DigeMassachusetts Mutu884 NoKentuckyKentuckyrGames develRichmond University Medical Center - Bayley Seton CampuArchie Balboaher Ave.avena Summit S423-3P[place]chard SurgeryMercJenel [phone] rm threshold testing, impedances and sensing are good, AP 4%, VP 2% I discussed with BSCi representative and "rate drop" program/response remains functioning despite ERI  06/17/08: TTE Normal LV systolic function Trace MR/TR  Recent Labs: 08/06/2016: Magnesium 2.0 06/04/2017: ALT 11; BUN 10; Creatinine, Ser 0.89; Hemoglobin 13.4; Platelets 362; Potassium 4.1; Sodium 141; TSH 2.400  No results found for requested labs within last 8760 hours.   Estimated Creatinine Clearance: 79.4 mL/min (by C-G formula based on SCr of 0.89 mg/dL).   Wt Readings from Last 3 Encounters:  06/13/17 152 lb (68.9 kg)  06/04/17 154 lb (69.9 kg)  02/23/17 145 lb (65.8 kg)     Other studies reviewed: Additional studies/records reviewed today include: summarized above  ASSESSMENT AND PLAN:  1. PPM     ERI has been reached     generator change procedre, risks, beneofts were discussed and patient would like to proceed     will schedule at next available with Dr. C  2. Neurocardiogenic syncope (mixed response)     We discussed symptom recognition, hydration, and safety as well as not to avoid salt/alty foods     She has not had syncope    Disposition: F/u with routine post generator change followup  Current medicines are reviewed at length with the patient today.  The patient did not have any concerns regarding medicines.  Signed, Renee Ursuy, PA-C 06/13/2017 12:19 PM     [facility] [address] [place]  [address] [phone] (office)  [phone] (fax)

## 2017-06-20 ENCOUNTER — Encounter (HOSPITAL_COMMUNITY): Payer: Self-pay | Admitting: Cardiovascular Disease

## 2017-06-20 MED FILL — Lidocaine HCl Local Inj 2%: INTRAMUSCULAR | Qty: 10 | Status: AC

## 2017-06-26 ENCOUNTER — Ambulatory Visit (INDEPENDENT_AMBULATORY_CARE_PROVIDER_SITE_OTHER): Payer: No Typology Code available for payment source | Admitting: *Deleted

## 2017-06-26 ENCOUNTER — Encounter: Payer: Self-pay | Admitting: *Deleted

## 2017-06-26 ENCOUNTER — Telehealth: Payer: Self-pay | Admitting: Cardiology

## 2017-06-26 DIAGNOSIS — R55 Syncope and collapse: Secondary | ICD-10-CM

## 2017-06-26 DIAGNOSIS — Z950 Presence of cardiac pacemaker: Secondary | ICD-10-CM

## 2017-06-26 NOTE — Telephone Encounter (Signed)
Patient called and stated that she went back to school on Monday and had her book bag on her right shoulder and she fell. Now the incision site will not stop bleeding. Call routed to Device Clinic RN.

## 2017-06-26 NOTE — Progress Notes (Signed)
[name] presents to the Device Clinic today with c/o bleeding from PPM site after falling Monday 06/24/17. Left chest incision. Incision edges approximated, no active bleeding, pocket soft upon palpation. Some edema noted. [name] reports that it is very tender and that she noticed a nickle-sized spot of blood on her bandage this morning that was "thick". I assume this is some bleeding from the fall and it has since clotted. I advised her to monitor the site, call back with any additional swelling, drainage, active bright-red blood, or incision warm to touch. She verbalizes understanding. She will return to Device Clinic as scheduled on 07/01/17.

## 2017-06-26 NOTE — Telephone Encounter (Signed)
Jean Rowe reports that she tried to bandage the incision and that there is more blood on the bandage than when she left the hospital. Scheduled for wound check today at 2pm as the patient won't have a ride until this afternoon.

## 2017-07-01 ENCOUNTER — Ambulatory Visit (INDEPENDENT_AMBULATORY_CARE_PROVIDER_SITE_OTHER): Payer: No Typology Code available for payment source | Admitting: *Deleted

## 2017-07-01 ENCOUNTER — Encounter: Payer: Self-pay | Admitting: *Deleted

## 2017-07-01 DIAGNOSIS — Z950 Presence of cardiac pacemaker: Secondary | ICD-10-CM

## 2017-07-01 DIAGNOSIS — R55 Syncope and collapse: Secondary | ICD-10-CM

## 2017-07-01 LAB — CUP PACEART INCLINIC DEVICE CHECK
Brady Statistic RA Percent Paced: 5 %
Brady Statistic RV Percent Paced: 1 % — CL
Implantable Lead Implant Date: 20060119
Implantable Lead Location: 753860
Implantable Lead Model: 4456
Implantable Lead Model: 4469
Implantable Lead Serial Number: 439053
Lead Channel Impedance Value: 385 Ohm
Lead Channel Pacing Threshold Amplitude: 0.4 V
Lead Channel Pacing Threshold Amplitude: 0.5 V
Lead Channel Sensing Intrinsic Amplitude: 16.9 mV
Lead Channel Setting Pacing Amplitude: 2.5 V
Lead Channel Setting Pacing Pulse Width: 0.4 ms
Lead Channel Setting Sensing Sensitivity: 2.5 mV
MDC IDC LEAD IMPLANT DT: 20060119
MDC IDC LEAD LOCATION: 753859
MDC IDC LEAD SERIAL: 457210
MDC IDC MSMT LEADCHNL RA PACING THRESHOLD PULSEWIDTH: 0.4 ms
MDC IDC MSMT LEADCHNL RA SENSING INTR AMPL: 5.5 mV
MDC IDC MSMT LEADCHNL RV IMPEDANCE VALUE: 397 Ohm
MDC IDC MSMT LEADCHNL RV PACING THRESHOLD PULSEWIDTH: 0.4 ms
MDC IDC PG IMPLANT DT: 20180926
MDC IDC SESS DTM: 20181008040000
MDC IDC SET LEADCHNL RA PACING AMPLITUDE: 2 V
Pulse Gen Serial Number: 789100

## 2017-07-01 NOTE — Progress Notes (Signed)
Wound check appointment. Staples removed. Wound without redness or edema. Incision edges approximated, wound well healed. Normal device function. Thresholds, sensing, and impedances consistent with implant measurements. Device programmed at chronic outputs; RA output reduced to 2.0V @ 0.57ms. Histogram distribution appropriate for patient and level of activity. 1 ATR episode and 1 SVT episode--same episode per date/time stamp, 1:1 SVT, patient asymptomatic. Patient educated about wound care, arm mobility, lifting restrictions, and Latitude monitor. ROV with MC on 10/02/17.

## 2017-07-02 ENCOUNTER — Ambulatory Visit (INDEPENDENT_AMBULATORY_CARE_PROVIDER_SITE_OTHER): Payer: No Typology Code available for payment source | Admitting: Physician Assistant

## 2017-07-03 ENCOUNTER — Other Ambulatory Visit: Payer: Self-pay | Admitting: Cardiovascular Disease

## 2017-07-15 ENCOUNTER — Other Ambulatory Visit (INDEPENDENT_AMBULATORY_CARE_PROVIDER_SITE_OTHER): Payer: Self-pay | Admitting: Physician Assistant

## 2017-07-15 DIAGNOSIS — K0889 Other specified disorders of teeth and supporting structures: Secondary | ICD-10-CM

## 2017-07-15 DIAGNOSIS — G8929 Other chronic pain: Secondary | ICD-10-CM

## 2017-07-17 NOTE — Telephone Encounter (Signed)
FWD to PCP. Sten Dematteo S Aashrith Eves, CMA  

## 2017-07-19 ENCOUNTER — Encounter (INDEPENDENT_AMBULATORY_CARE_PROVIDER_SITE_OTHER): Payer: Self-pay | Admitting: Physician Assistant

## 2017-07-19 ENCOUNTER — Ambulatory Visit (INDEPENDENT_AMBULATORY_CARE_PROVIDER_SITE_OTHER): Payer: Self-pay | Admitting: Physician Assistant

## 2017-07-19 VITALS — BP 137/98 | HR 67 | Temp 98.0°F | Ht 60.63 in | Wt 152.2 lb

## 2017-07-19 DIAGNOSIS — J069 Acute upper respiratory infection, unspecified: Secondary | ICD-10-CM

## 2017-07-19 DIAGNOSIS — M797 Fibromyalgia: Secondary | ICD-10-CM

## 2017-07-19 DIAGNOSIS — F418 Other specified anxiety disorders: Secondary | ICD-10-CM

## 2017-07-19 DIAGNOSIS — B081 Molluscum contagiosum: Secondary | ICD-10-CM

## 2017-07-19 LAB — POCT RAPID STREP A (OFFICE): Rapid Strep A Screen: NEGATIVE

## 2017-07-19 MED ORDER — GABAPENTIN 300 MG PO CAPS: 300.0000 mg | ORAL_CAPSULE | Freq: Three times a day (TID) | ORAL | 3 refills | Status: DC

## 2017-07-19 MED ORDER — ESCITALOPRAM OXALATE 20 MG PO TABS: 20.0000 mg | ORAL_TABLET | Freq: Every day | ORAL | 2 refills | Status: DC

## 2017-07-19 MED ORDER — CLONAZEPAM 0.5 MG PO TABS: 0.5000 mg | ORAL_TABLET | Freq: Two times a day (BID) | ORAL | 1 refills | Status: DC | PRN

## 2017-07-19 MED ORDER — PHENYLEPHRINE-DM-GG-APAP 5-10-200-325 MG/10ML PO LIQD: 20.0000 mL | ORAL | 0 refills | Status: AC

## 2017-07-19 NOTE — Progress Notes (Signed)
Pt complains that she has been having muscle spasms and would like an Rx for flexaril

## 2017-07-19 NOTE — Progress Notes (Signed)
Subjective:  Patient ID: Jean Rowe, female    DOB: 01-25-1986  Age: 31 y.o. MRN: [id number]  CC: cold  HPI Jean DoeQuionna M Jarquin is a 31 y.o. female with a medical history of acne, anxiety, headaches, heart murmur, IBS, hypothyroidism, neurocardiogenic syncope, orthopnea, pacemaker, and panic attacks presents with a cold. Associated with mild sore throat and nasal congestion. Onset two days ago. 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        Has a small papule on the right shoulder that is bothersome. Says she scratched it off but it has returned.    Outpatient Medications Prior to Visit  Medication Sig Dispense Refill  . chlorhexidine (PERIDEX) 0.12 % solution USE AS DIRECTED 15 MLS IN THE MOUTH OR THROAT 2 TIMES DAILY 118 mL 0  . levothyroxine (SYNTHROID, LEVOTHROID) 112 MCG tablet Take 112 mcg by mouth daily before breakfast.    . naproxen (NAPROSYN) 500 MG tablet Take 500 mg by mouth 2 (two) times daily as needed for mild pain or moderate pain.    . naproxen (NAPROSYN) 500 MG tablet TAKE 1 TABLET BY MOUTH TWICE DAILY WITH A MEAL 30 tablet 0   No facility-administered medications prior to visit.      ROS Review of Systems  Constitutional: Positive for malaise/fatigue. Negative for chills and fever.  HENT: Positive for congestion and sore throat.   Eyes: Negative for blurred vision.  Respiratory: Negative for shortness of breath.   Cardiovascular: Negative for chest pain and palpitations.  Gastrointestinal: Negative for abdominal pain and nausea.  Genitourinary: Negative for dysuria and hematuria.  Musculoskeletal: Negative for joint pain and myalgias.  Skin: Negative for rash.       Lesion on shoulder  Neurological: Negative for tingling and headaches.  Psychiatric/Behavioral: Negative for depression. The patient is not nervous/anxious.     Objective:       Physical Exam  Constitutional: She is oriented to person, place, and time.  Well  developed, well nourished, NAD, polite  HENT:  Head: Normocephalic and atraumatic.  Mouth/Throat: No oropharyngeal exudate.  Eyes: No scleral icterus.  Neck: Normal range of motion. Neck supple.  Cardiovascular: Normal rate, regular rhythm and normal heart sounds.   Pulmonary/Chest: Effort normal and breath sounds normal.  Musculoskeletal: She exhibits no edema.  Lymphadenopathy:    She has cervical adenopathy (mild anterior cervical chain lymphadenopathy).  Neurological: She is alert and oriented to person, place, and time. No cranial nerve deficit. Coordination normal.  Skin: Skin is warm and dry. No rash noted. No erythema. No pallor.  Single, small, centrally umbilicated, pink lesion  Psychiatric: She has a normal mood and affect. Her behavior is normal. Thought content normal.  Vitals reviewed.    Assessment & Plan:     1. Acute upper respiratory infection - Rapid Strep A negative in clinic today - Begin Phenylephrine-DM-GG-APAP (MUCINEX FAST-MAX COLD FLU) 5-10-200-325 MG/10ML LIQD; Take 20 mLs by mouth every 4 (four) hours.  Dispense: 1 Bottle; Refill: 0  2. Fibromyalgia - Begin escitalopram (LEXAPRO) 20 MG tablet; Take 1 tablet (20 mg total) by mouth daily.  Dispense: 30 tablet; Refill: 2 - Begin gabapentin (NEURONTIN) 300 MG capsule; Take 1 capsule (300 mg total) by mouth 3 (three) times daily.  Dispense: 90 capsule; Refill: 3  3. Depression with anxiety - Begin escitalopram (LEXAPRO) 20 MG tablet; Take 1 tablet (20 mg total) by mouth daily.  Dispense: 30 tablet; Refill: 2 - Begin clonazePAM (KLONOPIN)  0.5 MG tablet; Take 1 tablet (0.5 mg total) by mouth 2 (two) times daily as needed for anxiety.  Dispense: 20 tablet; Refill: 1  4. Mollusca contagiosa - Ambulatory referral to Dermatology for cryotherapy   Meds ordered this encounter  Medications  . escitalopram (LEXAPRO) 20 MG tablet    Sig: Take 1 tablet (20 mg total) by mouth daily.    Dispense:  30 tablet     Refill:  2    Order Specific Question:   Supervising Provider    Answer:   Quentin Angst [id number]  . clonazePAM (KLONOPIN) 0.5 MG tablet    Sig: Take 1 tablet (0.5 mg total) by mouth 2 (two) times daily as needed for anxiety.    Dispense:  20 tablet    Refill:  1    Order Specific Question:   Supervising Provider    Answer:   Quentin Angst [id number]  . gabapentin (NEURONTIN) 300 MG capsule    Sig: Take 1 capsule (300 mg total) by mouth 3 (three) times daily.    Dispense:  90 capsule    Refill:  3    Order Specific Question:   Supervising Provider    Answer:   Quentin Angst [id number]  . Phenylephrine-DM-GG-APAP (MUCINEX FAST-MAX COLD FLU) 5-10-200-325 MG/10ML LIQD    Sig: Take 20 mLs by mouth every 4 (four) hours.    Dispense:  1 Bottle    Refill:  0    Order Specific Question:   Supervising Provider    Answer:   Quentin Angst [id number]    Follow-up: Return in about 6 weeks (around 08/30/2017) for depression, anxiety, fibromyalgia.   Loletta Specter PA

## 2017-07-19 NOTE — Patient Instructions (Signed)
Molluscum Contagiosum, Adult Molluscum contagiosum is a skin infection that can cause a rash. When molluscum contagiosum affects the genital area, it is called a sexually transmitted disease (STD). What are the causes? Molluscum contagiosum is caused by a virus. The virus can spread easily from person to person through:  Skin-to-skin contact with an infected person.  Contact with an infected object, such as a towel or clothing.  What increases the risk? You may be at higher risk for molluscum contagiosum if you:  Live in an area where the weather is moist and warm.  Have a weak body defense system (immune system).  What are the signs or symptoms? The main symptom is a rash that appears 2-7 weeks after exposure to the virus. It is made up of 2-20 small, firm, dome-shaped bumps that may:  Be pink or flesh-colored.  Appear alone or in groups.  Range from the size of a pinhead to the size of a pencil eraser.  Feel smooth and waxy.  Have a pit in the middle.  Itch. The rash does not itch for most people.  The bumps often appears on the genitals, thighs, face, neck, and abdomen. How is this diagnosed? A health care provider can usually diagnose molluscum contagiosum by looking at the bumps on your skin. To confirm the diagnosis, your health care provider may scrape the bumps to collect a skin sample to examine under a microscope. How is this treated? The bumps may go away on their own, but people often have treatment to keep the virus from infecting someone else or to keep the rash from spreading to other body parts. Treatment may include:  Surgery to remove the bumps by freezing them (cryosurgery).  A procedure to scrape off the bumps (curettage).  A procedure to remove the bumps with a laser.  Putting medicine on the bumps (topical treatment).  Sometimes no treatment is needed. Follow these instructions at home:  Take medicines only as directed by your health care  provider.  As long as you have bumps on your skin, the infection can spread to others and to other parts of your body. To prevent this from happening: ? Do not scratch the bumps. ? Do not share clothing or towels with others until the bumps disappear. ? Avoid close contact with others until the bumps disappear. This includes sexual contact. ? Wash your hands often. ? Cover the bumps with clothing or a bandage when you will be near other people. Contact a health care provider if:  The bumps are spreading.  The bumps are becoming red and sore.  The bumps have not gone away after 12 months. This information is not intended to replace advice given to you by your health care provider. Make sure you discuss any questions you have with your health care provider. Document Released: 04/07/2014 Document Revised: 02/16/2016 Document Reviewed: 02/17/2014 Elsevier Interactive Patient Education  2018 [employer] Upper Respiratory Infection, Adult Most upper respiratory infections (URIs) are a viral infection of the air passages leading to the lungs. A URI affects the nose, throat, and upper air passages. The most common type of URI is nasopharyngitis and is typically referred to as "the common cold." URIs run their course and usually go away on their own. Most of the time, a URI does not require medical attention, but sometimes a bacterial infection in the upper airways can follow a viral infection. This is called a secondary infection. Sinus and middle ear infections are common types of secondary  upper respiratory infections. Bacterial pneumonia can also complicate a URI. A URI can worsen asthma and chronic obstructive pulmonary disease (COPD). Sometimes, these complications can require emergency medical care and may be life threatening. What are the causes? Almost all URIs are caused by viruses. A virus is a type of germ and can spread from one person to another. What increases the risk? You may be  at risk for a URI if:  You smoke.  You have chronic heart or lung disease.  You have a weakened defense (immune) system.  You are very young or very old.  You have nasal allergies or asthma.  You work in crowded or poorly ventilated areas.  You work in health care facilities or schools.  What are the signs or symptoms? Symptoms typically develop 2-3 days after you come in contact with a cold virus. Most viral URIs last 7-10 days. However, viral URIs from the influenza virus (flu virus) can last 14-18 days and are typically more severe. Symptoms may include:  Runny or stuffy (congested) nose.  Sneezing.  Cough.  Sore throat.  Headache.  Fatigue.  Fever.  Loss of appetite.  Pain in your forehead, behind your eyes, and over your cheekbones (sinus pain).  Muscle aches.  How is this diagnosed? Your health care provider may diagnose a URI by:  Physical exam.  Tests to check that your symptoms are not due to another condition such as: ? Strep throat. ? Sinusitis. ? Pneumonia. ? Asthma.  How is this treated? A URI goes away on its own with time. It cannot be cured with medicines, but medicines may be prescribed or recommended to relieve symptoms. Medicines may help:  Reduce your fever.  Reduce your cough.  Relieve nasal congestion.  Follow these instructions at home:  Take medicines only as directed by your health care provider.  Gargle warm saltwater or take cough drops to comfort your throat as directed by your health care provider.  Use a warm mist humidifier or inhale steam from a shower to increase air moisture. This may make it easier to breathe.  Drink enough fluid to keep your urine clear or pale yellow.  Eat soups and other clear broths and maintain good nutrition.  Rest as needed.  Return to work when your temperature has returned to normal or as your health care provider advises. You may need to stay home longer to avoid infecting others.  You can also use a face mask and careful hand washing to prevent spread of the virus.  Increase the usage of your inhaler if you have asthma.  Do not use any tobacco products, including cigarettes, chewing tobacco, or electronic cigarettes. If you need help quitting, ask your health care provider. How is this prevented? The best way to protect yourself from getting a cold is to practice good hygiene.  Avoid oral or hand contact with people with cold symptoms.  Wash your hands often if contact occurs.  There is no clear evidence that vitamin C, vitamin E, echinacea, or exercise reduces the chance of developing a cold. However, it is always recommended to get plenty of rest, exercise, and practice good nutrition. Contact a health care provider if:  You are getting worse rather than better.  Your symptoms are not controlled by medicine.  You have chills.  You have worsening shortness of breath.  You have brown or red mucus.  You have yellow or brown nasal discharge.  You have pain in your face, especially when you  bend forward.  You have a fever.  You have swollen neck glands.  You have pain while swallowing.  You have white areas in the back of your throat. Get help right away if:  You have severe or persistent: ? Headache. ? Ear pain. ? Sinus pain. ? Chest pain.  You have chronic lung disease and any of the following: ? Wheezing. ? Prolonged cough. ? Coughing up blood. ? A change in your usual mucus.  You have a stiff neck.  You have changes in your: ? Vision. ? Hearing. ? Thinking. ? Mood. This information is not intended to replace advice given to you by your health care provider. Make sure you discuss any questions you have with your health care provider. Document Released: 03/06/2001 Document Revised: 05/13/2016 Document Reviewed: 12/16/2013 Elsevier Interactive Patient Education  2017 [employer].

## 2017-08-26 ENCOUNTER — Telehealth (INDEPENDENT_AMBULATORY_CARE_PROVIDER_SITE_OTHER): Payer: Self-pay | Admitting: Physician Assistant

## 2017-08-26 NOTE — Telephone Encounter (Signed)
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 I have already made referral to dermatology for cryotherapy. She should take gabapentin 300 mg once before bedtime to get used to the medicine. Once she is used to the medicine, then she can slowly increase to BID and finally TID.

## 2017-08-26 NOTE — Telephone Encounter (Signed)
FWD to PCP. Whittley Carandang S Dalana Pfahler, CMA  

## 2017-08-26 NOTE — Telephone Encounter (Signed)
Patient called to let you know that she don't like  gabapentin (NEURONTIN) 300 MG capsule and her rash on her back is spreading. Please, call her at  [phone]

## 2017-08-26 NOTE — Telephone Encounter (Signed)
Patient aware, patient is not taking gabapentin and has not had it since Friday. Does not like how it makes her feel mentally. Patient advised to keep F/U appt on 12/7 to speak with PCP about the issues. Jean Rowe S [name], CMA

## 2017-08-30 ENCOUNTER — Ambulatory Visit (INDEPENDENT_AMBULATORY_CARE_PROVIDER_SITE_OTHER): Payer: Self-pay | Admitting: Physician Assistant

## 2017-08-30 ENCOUNTER — Encounter (INDEPENDENT_AMBULATORY_CARE_PROVIDER_SITE_OTHER): Payer: Self-pay | Admitting: Physician Assistant

## 2017-08-30 ENCOUNTER — Other Ambulatory Visit: Payer: Self-pay

## 2017-08-30 ENCOUNTER — Other Ambulatory Visit: Payer: Self-pay | Admitting: Cardiovascular Disease

## 2017-08-30 VITALS — BP 118/80 | HR 64 | Temp 98.1°F | Wt 154.6 lb

## 2017-08-30 DIAGNOSIS — B081 Molluscum contagiosum: Secondary | ICD-10-CM

## 2017-08-30 DIAGNOSIS — M797 Fibromyalgia: Secondary | ICD-10-CM

## 2017-08-30 DIAGNOSIS — Z9119 Patient's noncompliance with other medical treatment and regimen: Secondary | ICD-10-CM

## 2017-08-30 DIAGNOSIS — F418 Other specified anxiety disorders: Secondary | ICD-10-CM

## 2017-08-30 DIAGNOSIS — Z91199 Patient's noncompliance with other medical treatment and regimen due to unspecified reason: Secondary | ICD-10-CM

## 2017-08-30 MED ORDER — GABAPENTIN 100 MG PO CAPS: 100.0000 mg | ORAL_CAPSULE | Freq: Three times a day (TID) | ORAL | 3 refills | Status: DC

## 2017-08-30 NOTE — Progress Notes (Signed)
Subjective:  Patient ID: Jean Rowe, female    DOB: August 22, 1986  Age: 31 y.o. MRN: [id number]  CC:   HPI  Jean Rowe is a 31 y.o. female with a medical history of acne, anxiety, headaches, heart murmur, IBS, hypothyroidism, neurocardiogenic syncope, orthopnea, pacemaker, and panic attacks presents because her previously diagnosed molluscum contagiosum is spreading anteriorly across her right shoulder. Still has not heard from dermatology. Referred 07/19/17.       Took Gabapentin TID for two weeks and stopped because it made her feel "foggy". She called here with the concern and was advised to reduced to 300 mg qhs. Did not follow advise and stopped altogether. Also did not take Escitalopram as she did not know what it was for. Continues with generalized pains attributed to fibromyalgia. No other symptoms or complaints.      Outpatient Medications Prior to Visit  Medication Sig Dispense Refill  . chlorhexidine (PERIDEX) 0.12 % solution USE AS DIRECTED 15 MLS IN THE MOUTH OR THROAT 2 TIMES DAILY 118 mL 0  . clonazePAM (KLONOPIN) 0.5 MG tablet Take 1 tablet (0.5 mg total) by mouth 2 (two) times daily as needed for anxiety. 20 tablet 1  . levothyroxine (SYNTHROID, LEVOTHROID) 112 MCG tablet Take 112 mcg by mouth daily before breakfast.    . naproxen (NAPROSYN) 500 MG tablet Take 500 mg by mouth 2 (two) times daily as needed for mild pain or moderate pain.    Marland Kitchen. escitalopram (LEXAPRO) 20 MG tablet Take 1 tablet (20 mg total) by mouth daily. (Patient not taking: Reported on 08/30/2017) 30 tablet 2  . gabapentin (NEURONTIN) 300 MG capsule Take 1 capsule (300 mg total) by mouth 3 (three) times daily. (Patient not taking: Reported on 08/30/2017) 90 capsule 3  . naproxen (NAPROSYN) 500 MG tablet TAKE 1 TABLET BY MOUTH TWICE DAILY WITH A MEAL (Patient not taking: Reported on 08/30/2017) 30 tablet 0   No facility-administered medications prior to visit.      ROS Review of Systems   Constitutional: Negative for chills, fever and malaise/fatigue.  Eyes: Negative for blurred vision.  Respiratory: Negative for shortness of breath.   Cardiovascular: Negative for chest pain and palpitations.  Gastrointestinal: Negative for abdominal pain and nausea.  Genitourinary: Negative for dysuria and hematuria.  Musculoskeletal: Negative for joint pain and myalgias.  Skin: Positive for rash.  Neurological: Negative for tingling and headaches.  Psychiatric/Behavioral: Negative for depression. The patient is not nervous/anxious.     Objective:  BP 118/80 (BP Location: Right Arm, Patient Position: Sitting, Cuff Size: Normal)   Pulse 64   Temp 98.1 F (36.7 C) (Oral)   Wt 154 lb 9.6 oz (70.1 kg)   SpO2 96%   BMI 29.57 kg/m   BP/Weight 08/30/2017 07/19/2017 06/19/2017  Systolic BP 118 137 126  Diastolic BP 80 98 84  Wt. (Lbs) 154.6 152.2 152  BMI 29.57 29.11 29.69      Physical Exam  Constitutional: She is oriented to person, place, and time.  Well developed, well nourished, NAD, polite  HENT:  Head: Normocephalic and atraumatic.  Eyes: No scleral icterus.  Neck: Normal range of motion. Neck supple.  Pulmonary/Chest: Effort normal.  Musculoskeletal: She exhibits no edema.  Neurological: She is alert and oriented to person, place, and time. No cranial nerve deficit. Coordination normal.  Skin: Skin is warm and dry. No rash noted. No erythema. No pallor.  Few more centrally umbilicated lesions on the right shoulder. No cellulitis, induration, suppuration,  or bleeding.  Psychiatric: She has a normal mood and affect. Her behavior is normal. Thought content normal.  Vitals reviewed.    Assessment & Plan:   1. Fibromyalgia - Decreased gabapentin (NEURONTIN) 100 MG capsule; Take 1 capsule (100 mg total) by mouth 3 (three) times daily.  Dispense: 90 capsule; Refill: 3  2. Depression with anxiety - Please take escitalopram as directed  3. Molluscum contagiosum - I  have explained that dermatologist will charge her despite her having an orange card. Patient is under the assumption that the orange card is for free healthcare.   4. Noncompliance   Meds ordered this encounter  Medications  . gabapentin (NEURONTIN) 100 MG capsule    Sig: Take 1 capsule (100 mg total) by mouth 3 (three) times daily.    Dispense:  90 capsule    Refill:  3    Order Specific Question:   Supervising Provider    Answer:   Quentin AngstJEGEDE, OLUGBEMIGA E [[id number]]    Follow-up: Return in about 4 weeks (around 09/27/2017) for depression with anxiety.   Loletta Specteroger David Eulene Pekar PA

## 2017-08-30 NOTE — Patient Instructions (Signed)
Molluscum Contagiosum, Adult Molluscum contagiosum is a skin infection that can cause a rash. When molluscum contagiosum affects the genital area, it is called a sexually transmitted disease (STD). What are the causes? Molluscum contagiosum is caused by a virus. The virus can spread easily from person to person through:  Skin-to-skin contact with an infected person.  Contact with an infected object, such as a towel or clothing.  What increases the risk? You may be at higher risk for molluscum contagiosum if you:  Live in an area where the weather is moist and warm.  Have a weak body defense system (immune system).  What are the signs or symptoms? The main symptom is a rash that appears 2-7 weeks after exposure to the virus. It is made up of 2-20 small, firm, dome-shaped bumps that may:  Be pink or flesh-colored.  Appear alone or in groups.  Range from the size of a pinhead to the size of a pencil eraser.  Feel smooth and waxy.  Have a pit in the middle.  Itch. The rash does not itch for most people.  The bumps often appears on the genitals, thighs, face, neck, and abdomen. How is this diagnosed? A health care provider can usually diagnose molluscum contagiosum by looking at the bumps on your skin. To confirm the diagnosis, your health care provider may scrape the bumps to collect a skin sample to examine under a microscope. How is this treated? The bumps may go away on their own, but people often have treatment to keep the virus from infecting someone else or to keep the rash from spreading to other body parts. Treatment may include:  Surgery to remove the bumps by freezing them (cryosurgery).  A procedure to scrape off the bumps (curettage).  A procedure to remove the bumps with a laser.  Putting medicine on the bumps (topical treatment).  Sometimes no treatment is needed. Follow these instructions at home:  Take medicines only as directed by your health care  provider.  As long as you have bumps on your skin, the infection can spread to others and to other parts of your body. To prevent this from happening: ? Do not scratch the bumps. ? Do not share clothing or towels with others until the bumps disappear. ? Avoid close contact with others until the bumps disappear. This includes sexual contact. ? Wash your hands often. ? Cover the bumps with clothing or a bandage when you will be near other people. Contact a health care provider if:  The bumps are spreading.  The bumps are becoming red and sore.  The bumps have not gone away after 12 months. This information is not intended to replace advice given to you by your health care provider. Make sure you discuss any questions you have with your health care provider. Document Released: 04/07/2014 Document Revised: 02/16/2016 Document Reviewed: 02/17/2014 Elsevier Interactive Patient Education  2018 [employer] Inc[employer]  

## 2017-09-06 ENCOUNTER — Other Ambulatory Visit: Payer: Self-pay | Admitting: Endocrinology

## 2017-09-13 ENCOUNTER — Other Ambulatory Visit: Payer: Self-pay | Admitting: Endocrinology

## 2017-09-27 ENCOUNTER — Ambulatory Visit (INDEPENDENT_AMBULATORY_CARE_PROVIDER_SITE_OTHER): Payer: No Typology Code available for payment source | Admitting: Physician Assistant

## 2017-10-02 ENCOUNTER — Encounter: Payer: Self-pay | Admitting: Cardiovascular Disease

## 2017-10-02 ENCOUNTER — Telehealth: Payer: Self-pay | Admitting: Endocrinology

## 2017-10-02 ENCOUNTER — Ambulatory Visit (INDEPENDENT_AMBULATORY_CARE_PROVIDER_SITE_OTHER): Payer: No Typology Code available for payment source | Admitting: Cardiovascular Disease

## 2017-10-02 VITALS — BP 113/80 | HR 67 | Ht 60.0 in | Wt 158.8 lb

## 2017-10-02 DIAGNOSIS — E890 Postprocedural hypothyroidism: Secondary | ICD-10-CM

## 2017-10-02 DIAGNOSIS — R55 Syncope and collapse: Secondary | ICD-10-CM

## 2017-10-02 DIAGNOSIS — Z950 Presence of cardiac pacemaker: Secondary | ICD-10-CM

## 2017-10-02 MED ORDER — LEVOTHYROXINE SODIUM 112 MCG PO TABS: 112.0000 ug | ORAL_TABLET | Freq: Every day | ORAL | 0 refills | Status: DC

## 2017-10-02 NOTE — Telephone Encounter (Signed)
Need refill of levothyroxine (SYNTHROID, LEVOTHROID) 112 MCG tablet [[id number]]   [facility] [phone]

## 2017-10-02 NOTE — Progress Notes (Signed)
Cardiology Office Note    Date:  10/02/2017   ID:  Jean DoeQuionna M Mctighe, DOB 08-20-1986, MRN [id number]  PCP:  Loletta SpecterGomez, Roger David, PA-C  Cardiologist:   Thurmon FairMihai Joycelyn Liska, MD   Chief Complaint  Patient presents with  . Follow-up  Pacemaker generator change out/neurocardiogenic syncope  History of Present Illness:  Jean Rowe is a 32 y.o. 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   Is her first follow-up appointment since she underwent pacemaker generator change last September. She has had a few episodes of near-syncope but no full blown syncope. Most recently she became very lightheaded, flushed and diaphoretic when she was shoveling snow on December 9 around noontime. She was able to avoid syncope by crouching down. Symptoms resolved within about 2-3 minutes. The device shows appropriate sudden bradycardia response for these events.  Pacemaker interrogation shows normal device function. She only has 4% atrial pacing and never requires atrial pacing. A few episodes of rapid rates probably represent sinus tachycardia. Lead parameters remain excellent. Estimated generator longevity is 15 years.  She has developed a little bit of a keloid scar at the pacemaker site which causes some itching and stinging discomfort. Otherwise the site looks healthy.   Past Medical History:  Diagnosis Date  . Acne   . Anxiety    panic attacks  . Daily headache   . Heart murmur   . History of IBS   . History of kidney infection   . Hypothyroidism   . Neurocardiogenic syncope   . Neurocardiogenic syncope   . Orthopnoea   . 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 Panic attacks   . Symptomatic bradycardia   . Thyroid disease     Past Surgical History:  Procedure Laterality Date  . DILATION AND  CURETTAGE OF UTERUS  2013  . INSERT / REPLACE / REMOVE PACEMAKER  09/2004  . PPM GENERATOR CHANGEOUT N/A 06/19/2017   Procedure: PPM GENERATOR CHANGEOUT;  Surgeon: Thurmon Fairroitoru, Crystol Walpole, MD;  [facility]: [facility];  Service: Cardiovascular;  Laterality: N/A;  . THYROIDECTOMY  11/04/2013  . THYROIDECTOMY N/A 11/04/2013   Procedure: THYROIDECTOMY;  Surgeon: Ernestene MentionHaywood M Ingram, MD;  [facility];  Service: General;  Laterality: N/A;    Current Medications: Outpatient Medications Prior to Visit  Medication Sig Dispense Refill  . chlorhexidine (PERIDEX) 0.12 % solution USE AS DIRECTED 15 MLS IN THE MOUTH OR THROAT 2 TIMES DAILY 118 mL 0  . clonazePAM (KLONOPIN) 0.5 MG tablet Take 1 tablet (0.5 mg total) by mouth 2 (two) times daily as needed for anxiety. 20 tablet 1  . escitalopram (LEXAPRO) 20 MG tablet Take 1 tablet (20 mg total) by mouth daily. (Patient not taking: Reported on 08/30/2017) 30 tablet 2  . gabapentin (NEURONTIN) 100 MG capsule Take 1 capsule (100 mg total) by mouth 3 (three) times daily. 90 capsule 3  . levothyroxine (SYNTHROID, LEVOTHROID) 112 MCG tablet TAKE 1 TABLET BY MOUTH ONCE DAILY 15 tablet 0   No facility-administered medications prior to visit.      Allergies:   Banana; Other; Latex; and Penicillins   Social History   Socioeconomic History  . Marital status: Single    Spouse name: None  . Number of children: None  . Years of education: None  . Highest education level: None  Social Needs  .  Financial resource strain: None  . Food insecurity - worry: None  . Food insecurity - inability: None  . Transportation needs - medical: None  . Transportation needs - non-medical: None  Occupational History  . None  Tobacco Use  . Smoking status: Current Every Day Smoker    Years: 0.50    Types: Cigars  . Smokeless tobacco: Never Used  . Tobacco comment: 11/04/2013 "smoke 3 black N milds/day"  Substance and Sexual Activity  . Alcohol use: Yes    Comment: 11/04/2013  "mixed drinks or wine once a month  . Drug use: No    Comment: none for 6 mos. since 03/2014  . Sexual activity: Yes    Birth control/protection: IUD  Other Topics Concern  . None  Social History Narrative   Epworth Sleepiness Scale = 12 (as of 10/22/2016)     Family History:  The patient's family history includes Heart disease in her mother; Heart failure in her maternal grandmother; Hypertension in her maternal grandfather and maternal grandmother; Thyroid disease in her maternal grandmother, maternal uncle, and mother.   ROS:   Please see the history of present illness.    ROS All other systems reviewed and are negative.   PHYSICAL EXAM:   VS:  BP 113/80   Pulse 67   Ht 5' (1.524 m)   Wt 158 lb 12.8 oz (72 kg)   BMI 31.01 kg/m     General: Alert, oriented x3, no distress, mildly obese Head: no evidence of trauma, PERRL, EOMI, no exophtalmos or lid lag, no myxedema, no xanthelasma; normal ears, nose and oropharynx Neck: normal jugular venous pulsations and no hepatojugular reflux; brisk carotid pulses without delay and no carotid bruits Chest: clear to auscultation, no signs of consolidation by percussion or palpation, normal fremitus, symmetrical and full respiratory excursions. Well-healed left subclavian pacemaker scar but with some keloid formation. Cardiovascular: normal position and quality of the apical impulse, regular rhythm, normal first and second heart sounds, no murmurs, rubs or gallops Abdomen: no tenderness or distention, no masses by palpation, no abnormal pulsatility or arterial bruits, normal bowel sounds, no hepatosplenomegaly Extremities: no clubbing, cyanosis or edema; 2+ radial, ulnar and brachial pulses bilaterally; 2+ right femoral, posterior tibial and dorsalis pedis pulses; 2+ left femoral, posterior tibial and dorsalis pedis pulses; no subclavian or femoral bruits Neurological: grossly nonfocal Psych: Normal mood and affect  Wt Readings from Last 3  Encounters:  10/02/17 158 lb 12.8 oz (72 kg)  08/30/17 154 lb 9.6 oz (70.1 kg)  07/19/17 152 lb 3.2 oz (69 kg)      Studies/Labs Reviewed:   EKG:  EKG is ordered today.  The ekg ordered today demonstrates sinus rhythm, normal tracing  Recent Labs: 06/04/2017: ALT 11; TSH 2.400 06/14/2017: BUN 8; Creatinine, Ser 0.77; Hemoglobin 12.7; Platelets 315; Potassium 4.2; Sodium 137    ASSESSMENT:    1. Neurocardiogenic syncope   2. Pacemaker   3. Postsurgical hypothyroidism      PLAN:  In order of problems listed above:  1. Neurocardiogenic syncope: This has resolved completely following pacemaker implantation, after she unsuccessfully tried treatment with fludrocortisone and beta blockers. She still has some mild near syncopal events likely related to vasodepressor effect. The most recent event was related to shoveling snow. Discussed the importance of staying well hydrated, avoiding prolonged orthostasis, avoiding triggers, promptly spontaneously to prodromal symptoms by lying down or crouching. 2. PPM: Normal device function. Plan remote downloads every 3 months and yearly office visits.  I don't think there is any appropriate additional change we can make to device function to prevent near-syncope. 3. Hypothyroidism: On supplement     Medication Adjustments/Labs and Tests Ordered: Current medicines are reviewed at length with the patient today.  Concerns regarding medicines are outlined above.  Medication changes, Labs and Tests ordered today are listed in the Patient Instructions below. Patient Instructions  Dr Royann Shivers recommends that you continue on your current medications as directed. Please refer to the Current Medication list given to you today.  Remote monitoring is used to monitor your Pacemaker or ICD from home. This monitoring reduces the number of office visits required to check your device to one time per year. It allows [place] to keep an eye on the functioning of your device  to ensure it is working properly. You are scheduled for a device check from home on Wednesday, April 10th, 2019. You may send your transmission at any time that day. If you have a wireless device, the transmission will be sent automatically. After your physician reviews your transmission, you will receive a notification with your next transmission date.  Dr Royann Shivers recommends that you schedule a follow-up appointment in 12 months with a pacemaker check. You will receive a reminder letter in the mail two months in advance. If you don't receive a letter, please call our office to schedule the follow-up appointment.  If you need a refill on your cardiac medications before your next appointment, please call your pharmacy.    Signed, Thurmon Fair, MD  10/02/2017 5:30 PM    [facility] [address] [place], [place], [place]  [address] Phone: [phone]; Fax: [phone]

## 2017-10-02 NOTE — Patient Instructions (Signed)
Dr Croitoru recommends that you continue on your current medications as directed. Please refer to the Current Medication list given to you today.  Remote monitoring is used to monitor your Pacemaker or ICD from home. This monitoring reduces the number of office visits required to check your device to one time per year. It allows us to keep an eye on the functioning of your device to ensure it is working properly. You are scheduled for a device check from home on Wednesday, April 10th, 2019. You may send your transmission at any time that day. If you have a wireless device, the transmission will be sent automatically. After your physician reviews your transmission, you will receive a notification with your next transmission date.  Dr Croitoru recommends that you schedule a follow-up appointment in 12 months with a pacemaker check. You will receive a reminder letter in the mail two months in advance. If you don't receive a letter, please call our office to schedule the follow-up appointment.  If you need a refill on your cardiac medications before your next appointment, please call your pharmacy. 

## 2017-10-03 ENCOUNTER — Ambulatory Visit: Payer: No Typology Code available for payment source | Admitting: Endocrinology

## 2017-10-03 DIAGNOSIS — Z0289 Encounter for other administrative examinations: Secondary | ICD-10-CM

## 2017-10-04 ENCOUNTER — Ambulatory Visit: Payer: No Typology Code available for payment source | Attending: Physician Assistant | Admitting: *Deleted

## 2017-10-08 ENCOUNTER — Other Ambulatory Visit: Payer: Self-pay

## 2017-10-08 MED ORDER — LEVOTHYROXINE SODIUM 112 MCG PO TABS: 112.0000 ug | ORAL_TABLET | Freq: Every day | ORAL | 5 refills | Status: DC

## 2017-10-08 NOTE — Telephone Encounter (Signed)
This has been sent

## 2017-10-18 ENCOUNTER — Telehealth: Payer: Self-pay | Admitting: *Deleted

## 2017-10-18 NOTE — Telephone Encounter (Signed)
Medical Assistant left message on patient's home and cell voicemail. Voicemail states to give a call back to [place] with [facility] at [phone]. Patient is aware of lab report being normal and advised to pick up OTC polysporin ointment and use it on the shoulder area Three times a day. Patient should be scheduled with dermatology in 2 weeks.

## 2017-11-01 ENCOUNTER — Ambulatory Visit: Payer: No Typology Code available for payment source

## 2017-11-04 ENCOUNTER — Encounter: Payer: Self-pay | Admitting: Endocrinology

## 2017-11-04 LAB — CUP PACEART INCLINIC DEVICE CHECK
Implantable Lead Implant Date: 20060119
Implantable Lead Location: 753859
Implantable Lead Model: 4469
Implantable Pulse Generator Implant Date: 20180926
MDC IDC LEAD IMPLANT DT: 20060119
MDC IDC LEAD LOCATION: 753860
MDC IDC LEAD SERIAL: 439053
MDC IDC LEAD SERIAL: 457210
MDC IDC SESS DTM: 20190211171835
Pulse Gen Serial Number: 789100

## 2017-11-06 ENCOUNTER — Telehealth: Payer: Self-pay | Admitting: Endocrinology

## 2017-11-06 ENCOUNTER — Emergency Department (HOSPITAL_COMMUNITY)
Admission: EM | Admit: 2017-11-06 | Discharge: 2017-11-06 | Disposition: A | Discharge status: Home / Self Care | Payer: No Typology Code available for payment source | Attending: Emergency Medicine | Admitting: Emergency Medicine

## 2017-11-06 ENCOUNTER — Other Ambulatory Visit: Payer: Self-pay

## 2017-11-06 ENCOUNTER — Encounter (HOSPITAL_COMMUNITY): Payer: Self-pay | Admitting: *Deleted

## 2017-11-06 DIAGNOSIS — E039 Hypothyroidism, unspecified: Secondary | ICD-10-CM | POA: Insufficient documentation

## 2017-11-06 DIAGNOSIS — Z79899 Other long term (current) drug therapy: Secondary | ICD-10-CM | POA: Insufficient documentation

## 2017-11-06 DIAGNOSIS — J111 Influenza due to unidentified influenza virus with other respiratory manifestations: Secondary | ICD-10-CM | POA: Insufficient documentation

## 2017-11-06 DIAGNOSIS — F17210 Nicotine dependence, cigarettes, uncomplicated: Secondary | ICD-10-CM | POA: Insufficient documentation

## 2017-11-06 DIAGNOSIS — R6889 Other general symptoms and signs: Secondary | ICD-10-CM

## 2017-11-06 DIAGNOSIS — Z91040 Latex allergy status: Secondary | ICD-10-CM | POA: Insufficient documentation

## 2017-11-06 LAB — INFLUENZA PANEL BY PCR (TYPE A & B)
Influenza A By PCR: NEGATIVE
Influenza B By PCR: NEGATIVE

## 2017-11-06 LAB — RAPID STREP SCREEN (MED CTR MEBANE ONLY): Streptococcus, Group A Screen (Direct): NEGATIVE

## 2017-11-06 MED ORDER — OSELTAMIVIR PHOSPHATE 75 MG PO CAPS: 75.0000 mg | ORAL_CAPSULE | Freq: Two times a day (BID) | ORAL | 0 refills | Status: DC

## 2017-11-06 MED ORDER — ACETAMINOPHEN 325 MG PO TABS
650.0000 mg | ORAL_TABLET | Freq: Once | ORAL | Status: AC
  Administered 2017-11-06: 650 mg via ORAL
  Filled 2017-11-06: qty 2

## 2017-11-06 NOTE — ED Triage Notes (Signed)
Pt reports flu like symptoms since Sunday. Having bodyaches, chills, fever, sore throat, cough. Mask on pt at triage.

## 2017-11-06 NOTE — Discharge Instructions (Signed)
Please read attached information. If you experience any new or worsening signs or symptoms please return to the emergency room for evaluation. Please follow-up with your primary care provider or specialist as discussed. Please use medication prescribed only as directed and discontinue taking if you have any concerning signs or symptoms.   °

## 2017-11-06 NOTE — ED Provider Notes (Signed)
[facility] EMERGENCY DEPARTMENT Provider Note   CSN: [id number] Arrival date & time: 11/06/17  4098   History   Chief Complaint Chief Complaint  Patient presents with  . Influenza    HPI ITZEL MCKIBBIN is a 32 y.o. female.  HPI    32 year old female presents today with complaints of fatigue.  Patient notes that 4 days ago she developed cough, body aches,sore throat fatigue and fever.  She notes they have continue to persist despite Sudafed, Tylenol.  Patient denies any shortness of breath, she reports she did not receive the flu vaccination.  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  She denies any significant past medical history other than neuro cardiogenic syncope.     Past Medical History:  Diagnosis Date  . Acne   . Anxiety    panic attacks  . Daily headache   . Heart murmur   . History of IBS   . History of kidney infection   . Hypothyroidism   . Neurocardiogenic syncope   . Neurocardiogenic syncope   . Orthopnoea   . 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 Panic attacks   . Symptomatic bradycardia   . Thyroid disease     Patient Active Problem List   Diagnosis Date Noted  . Pacemaker battery depletion   . UTI (urinary tract infection) 10/19/2014  . Paresthesia 09/01/2014  . 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 Neurocardiogenic syncope 06/23/2013  . Pacemaker - [employer] 2006 06/23/2013  . Goiter 06/23/2013    Past Surgical History:  Procedure Laterality Date  . DILATION AND CURETTAGE OF UTERUS  2013  . INSERT / REPLACE / REMOVE PACEMAKER  09/2004  . PPM GENERATOR CHANGEOUT N/A 06/19/2017   Procedure: PPM GENERATOR CHANGEOUT;  Surgeon: Thurmon Fair, MD;  Location: [facility];  Service: Cardiovascular;  Laterality: N/A;  . THYROIDECTOMY  11/04/2013  . THYROIDECTOMY N/A 11/04/2013   Procedure: THYROIDECTOMY;  Surgeon: Ernestene Mention, MD;  Location: [facility]  OR;  Service: General;  Laterality: N/A;    OB History    Gravida Para Term Preterm AB Living   2 2       2    SAB TAB Ectopic Multiple Live Births                   Home Medications    Prior to Admission medications   Medication Sig Start Date End Date Taking? Authorizing Provider  chlorhexidine (PERIDEX) 0.12 % solution USE AS DIRECTED 15 MLS IN THE MOUTH OR THROAT 2 TIMES DAILY 07/17/17   Loletta Specter, PA-C  clonazePAM (KLONOPIN) 0.5 MG tablet Take 1 tablet (0.5 mg total) by mouth 2 (two) times daily as needed for anxiety. 07/19/17   Loletta Specter, PA-C  escitalopram (LEXAPRO) 20 MG tablet Take 1 tablet (20 mg total) by mouth daily. Patient not taking: Reported on 08/30/2017 07/19/17   Loletta Specter, PA-C  gabapentin (NEURONTIN) 100 MG capsule Take 1 capsule (100 mg total) by mouth 3 (three) times daily. 08/30/17   Loletta Specter, PA-C  levothyroxine (SYNTHROID, LEVOTHROID) 112 MCG tablet Take 1 tablet (112 mcg total) by mouth daily. Please follow-up with PCP for future refills. 10/08/17   Reather Littler, MD  oseltamivir (TAMIFLU) 75 MG capsule Take 1 capsule (75 mg total) by mouth every 12 (twelve) hours. 11/06/17   Eyvonne Mechanic, PA-C    Family History Family History  Problem Relation  Age of Onset  . Thyroid disease Mother   . Heart disease Mother   . Thyroid disease Maternal Uncle   . Thyroid disease Maternal Grandmother   . Heart failure Maternal Grandmother   . Hypertension Maternal Grandmother   . Hypertension Maternal Grandfather     Social History Social History   Tobacco Use  . Smoking status: Current Every Day Smoker    Years: 0.50    Types: Cigars  . Smokeless tobacco: Never Used  . Tobacco comment: 11/04/2013 "smoke 3 black N milds/day"  Substance Use Topics  . Alcohol use: Yes    Comment: 11/04/2013 "mixed drinks or wine once a month  . Drug use: No    Comment: none for 6 mos. since 03/2014     Allergies   Banana; Other; Latex; and  Penicillins   Review of Systems Review of Systems  All other systems reviewed and are negative.    Physical Exam Updated Vital Signs BP 114/88   Pulse 86   Temp 98.2 F (36.8 C) (Oral)   Resp 16   SpO2 99%   Physical Exam  Constitutional: She is oriented to person, place, and time. She appears well-developed and well-nourished.  HENT:  Head: Normocephalic and atraumatic.  Eyes: Conjunctivae are normal. Pupils are equal, round, and reactive to light. Right eye exhibits no discharge. Left eye exhibits no discharge. No scleral icterus.  Neck: Normal range of motion. No JVD present. No tracheal deviation present.  Cardiovascular: Normal rate, regular rhythm, normal heart sounds and intact distal pulses. Exam reveals no gallop and no friction rub.  No murmur heard. Pulmonary/Chest: Effort normal and breath sounds normal. No stridor. No respiratory distress. She has no wheezes. She has no rales. She exhibits no tenderness.  Neurological: She is alert and oriented to person, place, and time. Coordination normal.  Psychiatric: She has a normal mood and affect. Her behavior is normal. Judgment and thought content normal.  Nursing note and vitals reviewed.   ED Treatments / Results  Labs (all labs ordered are listed, but only abnormal results are displayed) Labs Reviewed  RAPID STREP SCREEN (NOT AT [facility])  CULTURE, GROUP A STREP [facility])  INFLUENZA PANEL BY PCR (TYPE A & B)    EKG  EKG Interpretation None       Radiology No results found.  Procedures Procedures (including critical care time)  Medications Ordered in ED Medications  acetaminophen (TYLENOL) tablet 650 mg (650 mg Oral Given 11/06/17 1221)     Initial Impression / Assessment and Plan / ED Course  I have reviewed the triage vital signs and the nursing notes.  Pertinent labs & imaging results that were available during my care of the patient were reviewed by me and considered in my medical decision making  (see chart for details).      Final Clinical Impressions(s) / ED Diagnoses   Final diagnoses:  Flu-like symptoms    Labs:  Rapid strep, influenza -strep negative   Imaging:  Consults:  Therapeutics:   Discharge Meds: Tamiflu  Assessment/Plan: 32 year old female presents today with likely influenza.  Patient reports sore throat and is requesting strep test here.  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  Patient has a reassuring workup here, she is afebrile with no tachycardia reassuring lung sounds and near-perfect oxygen saturation.  No indication for chest x-ray at this time.  I discussed testing and treatment with patient that she does not fit criteria  for either, she would like testing and treatment.  Patient will be tested, she will follow-up with her results online, she was given a prescription for Tamiflu, she is given strict return precautions, she verbalized understanding and agreement to today's plan.     ED Discharge Orders        Ordered    oseltamivir (TAMIFLU) 75 MG capsule  Every 12 hours     11/06/17 1328       Eyvonne Mechanic, PA-C 11/06/17 1330    Mabe, Latanya Maudlin, MD 11/06/17 1346

## 2017-11-06 NOTE — Telephone Encounter (Signed)
Patient dismissed from [facility] by Reather LittlerAjay Kumar MD, effective November 04, 2017. Dismissal letter sent out by certified / registered mail.  daj

## 2017-11-08 LAB — CULTURE, GROUP A STREP (THRC)

## 2017-11-14 NOTE — Telephone Encounter (Signed)
Received signed domestic return receipt verifying delivery of certified letter on November 08, 2017. Article number [id number] daj

## 2018-01-01 ENCOUNTER — Ambulatory Visit (INDEPENDENT_AMBULATORY_CARE_PROVIDER_SITE_OTHER): Payer: No Typology Code available for payment source | Admitting: *Deleted

## 2018-01-01 DIAGNOSIS — R55 Syncope and collapse: Secondary | ICD-10-CM

## 2018-01-01 NOTE — Progress Notes (Signed)
Remote pacemaker transmission.   

## 2018-01-02 ENCOUNTER — Encounter: Payer: Self-pay | Admitting: Cardiology

## 2018-02-07 LAB — CUP PACEART REMOTE DEVICE CHECK
Battery Remaining Longevity: 180 mo
Battery Remaining Percentage: 100 %
Brady Statistic RA Percent Paced: 4 %
Brady Statistic RV Percent Paced: 0 %
Date Time Interrogation Session: 20190410070100
Implantable Lead Implant Date: 20060119
Implantable Lead Implant Date: 20060119
Implantable Lead Location: 753859
Implantable Lead Location: 753860
Implantable Lead Model: 4456
Implantable Lead Model: 4469
Implantable Lead Serial Number: 439053
Implantable Lead Serial Number: 457210
Implantable Pulse Generator Implant Date: 20180926
Lead Channel Impedance Value: 383 Ohm
Lead Channel Impedance Value: 404 Ohm
Lead Channel Pacing Threshold Amplitude: 0.4 V
Lead Channel Pacing Threshold Amplitude: 0.7 V
Lead Channel Pacing Threshold Pulse Width: 0.4 ms
Lead Channel Pacing Threshold Pulse Width: 0.4 ms
Lead Channel Setting Pacing Amplitude: 2 V
Lead Channel Setting Pacing Amplitude: 2.5 V
Lead Channel Setting Pacing Pulse Width: 0.4 ms
Lead Channel Setting Sensing Sensitivity: 2.5 mV
Pulse Gen Serial Number: 789100

## 2018-04-02 ENCOUNTER — Ambulatory Visit (INDEPENDENT_AMBULATORY_CARE_PROVIDER_SITE_OTHER): Payer: No Typology Code available for payment source | Admitting: *Deleted

## 2018-04-02 DIAGNOSIS — R55 Syncope and collapse: Secondary | ICD-10-CM

## 2018-04-02 NOTE — Progress Notes (Signed)
Remote pacemaker transmission.   

## 2018-05-07 LAB — CUP PACEART REMOTE DEVICE CHECK
Battery Remaining Percentage: 100 %
Brady Statistic RA Percent Paced: 4 %
Brady Statistic RV Percent Paced: 0 %
Date Time Interrogation Session: 20190710070100
Implantable Lead Implant Date: 20060119
Implantable Lead Location: 753860
Implantable Lead Model: 4469
Implantable Lead Serial Number: 457210
Lead Channel Impedance Value: 394 Ohm
Lead Channel Pacing Threshold Amplitude: 0.4 V
Lead Channel Pacing Threshold Pulse Width: 0.4 ms
Lead Channel Setting Pacing Amplitude: 2.5 V
Lead Channel Setting Pacing Pulse Width: 0.4 ms
MDC IDC LEAD IMPLANT DT: 20060119
MDC IDC LEAD LOCATION: 753859
MDC IDC LEAD SERIAL: 439053
MDC IDC MSMT BATTERY REMAINING LONGEVITY: 174 mo
MDC IDC MSMT LEADCHNL RV IMPEDANCE VALUE: 425 Ohm
MDC IDC MSMT LEADCHNL RV PACING THRESHOLD AMPLITUDE: 0.7 V
MDC IDC MSMT LEADCHNL RV PACING THRESHOLD PULSEWIDTH: 0.4 ms
MDC IDC PG IMPLANT DT: 20180926
MDC IDC SET LEADCHNL RA PACING AMPLITUDE: 2 V
MDC IDC SET LEADCHNL RV SENSING SENSITIVITY: 2.5 mV
Pulse Gen Serial Number: 789100

## 2018-07-02 ENCOUNTER — Ambulatory Visit (INDEPENDENT_AMBULATORY_CARE_PROVIDER_SITE_OTHER): Payer: No Typology Code available for payment source | Admitting: *Deleted

## 2018-07-02 DIAGNOSIS — R55 Syncope and collapse: Secondary | ICD-10-CM

## 2018-07-03 NOTE — Progress Notes (Signed)
Remote pacemaker transmission.   

## 2018-07-17 LAB — CUP PACEART REMOTE DEVICE CHECK
Date Time Interrogation Session: 20191024152628
Implantable Lead Location: 753860
Implantable Lead Serial Number: 457210
Implantable Pulse Generator Implant Date: 20180926
MDC IDC LEAD IMPLANT DT: 20060119
MDC IDC LEAD IMPLANT DT: 20060119
MDC IDC LEAD LOCATION: 753859
MDC IDC LEAD SERIAL: 439053
Pulse Gen Serial Number: 789100

## 2018-08-25 ENCOUNTER — Other Ambulatory Visit: Payer: Self-pay | Admitting: Endocrinology

## 2018-09-04 ENCOUNTER — Ambulatory Visit (INDEPENDENT_AMBULATORY_CARE_PROVIDER_SITE_OTHER): Payer: No Typology Code available for payment source | Admitting: Physician Assistant

## 2018-09-19 ENCOUNTER — Encounter (INDEPENDENT_AMBULATORY_CARE_PROVIDER_SITE_OTHER): Payer: Self-pay | Admitting: Physician Assistant

## 2018-09-19 ENCOUNTER — Ambulatory Visit (INDEPENDENT_AMBULATORY_CARE_PROVIDER_SITE_OTHER): Payer: Self-pay | Admitting: Physician Assistant

## 2018-09-19 ENCOUNTER — Other Ambulatory Visit: Payer: Self-pay

## 2018-09-19 VITALS — BP 131/80 | HR 64 | Temp 98.0°F | Ht 60.0 in | Wt 156.6 lb

## 2018-09-19 DIAGNOSIS — R61 Generalized hyperhidrosis: Secondary | ICD-10-CM

## 2018-09-19 DIAGNOSIS — M797 Fibromyalgia: Secondary | ICD-10-CM

## 2018-09-19 DIAGNOSIS — K582 Mixed irritable bowel syndrome: Secondary | ICD-10-CM

## 2018-09-19 DIAGNOSIS — E039 Hypothyroidism, unspecified: Secondary | ICD-10-CM

## 2018-09-19 DIAGNOSIS — R109 Unspecified abdominal pain: Secondary | ICD-10-CM

## 2018-09-19 LAB — POCT URINALYSIS DIPSTICK
BILIRUBIN UA: NEGATIVE
GLUCOSE UA: NEGATIVE
Ketones, UA: NEGATIVE
Leukocytes, UA: NEGATIVE
Nitrite, UA: NEGATIVE
Protein, UA: POSITIVE — AB
RBC UA: NEGATIVE
Spec Grav, UA: 1.015 (ref 1.010–1.025)
Urobilinogen, UA: 0.2 E.U./dL
pH, UA: 7 (ref 5.0–8.0)

## 2018-09-19 MED ORDER — GABAPENTIN 100 MG PO CAPS: 100.0000 mg | ORAL_CAPSULE | Freq: Three times a day (TID) | ORAL | 3 refills | Status: DC

## 2018-09-19 MED ORDER — DULOXETINE HCL 30 MG PO CPEP: 30.0000 mg | ORAL_CAPSULE | Freq: Every day | ORAL | 3 refills | Status: DC

## 2018-09-19 MED ORDER — HYOSCYAMINE SULFATE 0.125 MG PO TABS: 0.1250 mg | ORAL_TABLET | ORAL | 0 refills | Status: DC | PRN

## 2018-09-19 MED FILL — GABAPENTIN 100 MG CAPSULE: 100 | 30 days supply | Qty: 90 | Fill #0

## 2018-09-19 MED FILL — DULoxetine HCL 30 MG CPEP: 30 | 30 days supply | Qty: 30 | Fill #0

## 2018-09-19 MED FILL — HYOSCYAMINE SULF 0.125 MG T: 0.125 | 5 days supply | Qty: 30 | Fill #0

## 2018-09-19 NOTE — Patient Instructions (Signed)
Diet for Irritable Bowel Syndrome  When you have irritable bowel syndrome (IBS), it is very important to eat the foods and follow the eating habits that are best for your condition. IBS may cause various symptoms such as pain in the abdomen, constipation, or diarrhea. Choosing the right foods can help to ease the discomfort from these symptoms. Work with your health care provider and diet and nutrition specialist (dietitian) to find the eating plan that will help to control your symptoms.  What are tips for following this plan?         · Keep a food diary. This will help you identify foods that cause symptoms. Write down:  ? What you eat and when you eat it.  ? What symptoms you have.  ? When symptoms occur in relation to your meals, such as "pain in abdomen 2 hours after dinner."  · Eat your meals slowly and in a relaxed setting.  · Aim to eat 5-6 small meals per day. Do not skip meals.  · Drink enough fluid to keep your urine pale yellow.  · Ask your health care provider if you should take an over-the-counter probiotic to help restore healthy bacteria in your gut (digestive tract).  ? Probiotics are foods that contain good bacteria and yeasts.  · Your dietitian may have specific dietary recommendations for you based on your symptoms. He or she may recommend that you:  ? Avoid foods that cause symptoms. Talk with your dietitian about other ways to get the same nutrients that are in those problem foods.  ? Avoid foods with gluten. Gluten is a protein that is found in rye, wheat, and barley.  ? Eat more foods that contain soluble fiber. Examples of foods with high soluble fiber include oats, seeds, and certain fruits and vegetables. Take a fiber supplement if directed by your dietitian.  ? Reduce or avoid certain foods called FODMAPs. These are foods that contain carbohydrates that are hard to digest. Ask your doctor which foods contain these carbohydrates.  What foods are not recommended?  The following are some  foods and drinks that may make your symptoms worse:  · Fatty foods, such as french fries.  · Foods that contain gluten, such as pasta and cereal.  · Dairy products, such as milk, cheese, and ice cream.  · Chocolate.  · Alcohol.  · Products with caffeine, such as coffee.  · Carbonated drinks, such as soda.  · Foods that are high in FODMAPs. These include certain fruits and vegetables.  · Products with sweeteners such as honey, high fructose corn syrup, sorbitol, and mannitol.  The items listed above may not be a complete list of foods and beverages you should avoid. Contact a dietitian for more information.  What foods are good sources of fiber?  Your health care provider or dietitian may recommend that you eat more foods that contain fiber. Fiber can help to reduce constipation and other IBS symptoms. Add foods with fiber to your diet a little at a time so your body can get used to them. Too much fiber at one time might cause gas and swelling of your abdomen. The following are some foods that are good sources of fiber:  · Berries, such as raspberries, strawberries, and blueberries.  · Tomatoes.  · Carrots.  · Brown rice.  · Oats.  · Seeds, such as chia and pumpkin seeds.  The items listed above may not be a complete list of recommended sources of fiber. Contact   your dietitian for more options.  Where to find more information  · International Foundation for Functional Gastrointestinal Disorders: www.iffgd.org  · National Institute of Diabetes and Digestive and Kidney Diseases: www.niddk.nih.gov  Summary  · When you have irritable bowel syndrome (IBS), it is very important to eat the foods and follow the eating habits that are best for your condition.  · IBS may cause various symptoms such as pain in the abdomen, constipation, or diarrhea.  · Choosing the right foods can help to ease the discomfort that comes from symptoms.  · Keep a food diary. This will help you identify foods that cause symptoms.  · Your health  care provider or diet and nutrition specialist (dietitian) may recommend that you eat more foods that contain fiber.  This information is not intended to replace advice given to you by your health care provider. Make sure you discuss any questions you have with your health care provider.  Document Released: 12/01/2003 Document Revised: 04/07/2018 Document Reviewed: 05/14/2017  Elsevier Interactive Patient Education © 2019 [employer]

## 2018-09-19 NOTE — Progress Notes (Signed)
Subjective:  Patient ID: Jean Rowe, female    DOB: 1986/08/29  Age: 32 y.o. MRN: [id number]  CC: flank pain, med refill  HPI Jean HeadsQuionna M Matthewsis a 32 y.o.femalewith a medical history of acne, anxiety, headaches, heart murmur, IBS, hypothyroidism, neurocardiogenic syncope, orthopnea, pacemaker, and panic attackspresents with RLQ pain, alternating diarrhea/constipation, abdominal bloating, fecal urgency, abdominal pain relieved with defecation, arthralgias, night sweats, and depressed mood. Has been lost to f/u. Last seen more than a year ago with noncompliance noted at the time. Not currently taking any medications besides her synthroid. Does not endorse any other symptoms or complaints.      Outpatient Medications Prior to Visit  Medication Sig Dispense Refill  . levothyroxine (SYNTHROID, LEVOTHROID) 112 MCG tablet Take 1 tablet (112 mcg total) by mouth daily. Please follow-up with PCP for future refills. 30 tablet 5  . chlorhexidine (PERIDEX) 0.12 % solution USE AS DIRECTED 15 MLS IN THE MOUTH OR THROAT 2 TIMES DAILY 118 mL 0  . clonazePAM (KLONOPIN) 0.5 MG tablet Take 1 tablet (0.5 mg total) by mouth 2 (two) times daily as needed for anxiety. 20 tablet 1  . escitalopram (LEXAPRO) 20 MG tablet Take 1 tablet (20 mg total) by mouth daily. (Patient not taking: Reported on 08/30/2017) 30 tablet 2  . gabapentin (NEURONTIN) 100 MG capsule Take 1 capsule (100 mg total) by mouth 3 (three) times daily. 90 capsule 3  . oseltamivir (TAMIFLU) 75 MG capsule Take 1 capsule (75 mg total) by mouth every 12 (twelve) hours. 10 capsule 0   No facility-administered medications prior to visit.      ROS Review of Systems  Constitutional: Positive for diaphoresis and malaise/fatigue. Negative for chills and fever.  Eyes: Negative for blurred vision.  Respiratory: Negative for shortness of breath.   Cardiovascular: Negative for chest pain and palpitations.  Gastrointestinal: Positive for  abdominal pain, constipation and diarrhea. Negative for nausea.  Genitourinary: Negative for dysuria and hematuria.  Musculoskeletal: Positive for joint pain. Negative for myalgias.  Skin: Negative for rash.  Neurological: Negative for tingling and headaches.  Psychiatric/Behavioral: Positive for depression. The patient is not nervous/anxious.     Objective:  BP 131/80 (BP Location: Right Arm, Patient Position: Sitting, Cuff Size: Normal)   Pulse 64   Temp 98 F (36.7 C) (Oral)   Ht 5' (1.524 m)   Wt 156 lb 9.6 oz (71 kg)   SpO2 97%   BMI 30.58 kg/m   BP/Weight 09/19/2018 11/06/2017 10/04/2017  Systolic BP 131 115 -  Diastolic BP 80 83 -  Wt. (Lbs) 156.6 - 156  BMI 30.58 - 30.47      Physical Exam Vitals signs reviewed.  Constitutional:      Comments: Well developed, well nourished, NAD, polite  HENT:     Head: Normocephalic and atraumatic.  Eyes:     General: No scleral icterus. Neck:     Musculoskeletal: Normal range of motion and neck supple.     Thyroid: No thyromegaly.  Cardiovascular:     Rate and Rhythm: Normal rate and regular rhythm.     Heart sounds: Normal heart sounds.  Pulmonary:     Effort: Pulmonary effort is normal.     Breath sounds: Normal breath sounds.  Abdominal:     General: There is no distension.     Palpations: Abdomen is soft. There is no mass.     Tenderness: There is abdominal tenderness (mild generalized tenderness in all quadrants). There is no guarding  or rebound.     Comments: Hyperactive bowel sounds.   Skin:    General: Skin is warm and dry.     Coloration: Skin is not pale.     Findings: No erythema or rash.  Neurological:     Mental Status: She is alert and oriented to person, place, and time.  Psychiatric:        Behavior: Behavior normal.        Thought Content: Thought content normal.     Comments: Depressed mood, constricted affect.      Assessment & Plan:   1. Irritable bowel syndrome with both constipation and  diarrhea - CBC with Differential - Comprehensive metabolic panel - Begin hyoscyamine (LEVSIN, ANASPAZ) 0.125 MG tablet; Take 1 tablet (0.125 mg total) by mouth every 4 (four) hours as needed for up to 5 days.  Dispense: 30 tablet; Refill: 0  2. Flank pain - Urinalysis Dipstick negative - CBC with Differential - Comprehensive metabolic panel  3. Night sweats - Thyroid Panel With TSH - Comprehensive metabolic panel  4. Fibromyalgia - Begin DULoxetine (CYMBALTA) 30 MG capsule; Take 1 capsule (30 mg total) by mouth daily.  Dispense: 30 capsule; Refill: 3 - Begin gabapentin (NEURONTIN) 100 MG capsule; Take 1 capsule (100 mg total) by mouth 3 (three) times daily.  Dispense: 90 capsule; Refill: 3 - Consider increasing doses at next visit.   5. Hypothyroidism, unspecified type - Thyroid Panel With TSH   Meds ordered this encounter  Medications  . DULoxetine (CYMBALTA) 30 MG capsule    Sig: Take 1 capsule (30 mg total) by mouth daily.    Dispense:  30 capsule    Refill:  3    Order Specific Question:   Supervising Provider    Answer:   Hoy RegisterNEWLIN, ENOBONG [[id number]]  . gabapentin (NEURONTIN) 100 MG capsule    Sig: Take 1 capsule (100 mg total) by mouth 3 (three) times daily.    Dispense:  90 capsule    Refill:  3    Order Specific Question:   Supervising Provider    Answer:   Hoy RegisterNEWLIN, ENOBONG [[id number]]  . hyoscyamine (LEVSIN, ANASPAZ) 0.125 MG tablet    Sig: Take 1 tablet (0.125 mg total) by mouth every 4 (four) hours as needed for up to 5 days.    Dispense:  30 tablet    Refill:  0    Order Specific Question:   Supervising Provider    Answer:   Hoy RegisterNEWLIN, ENOBONG [[id number]]    Follow-up: Return in about 4 weeks (around 10/17/2018) for IBS and mood., Fibromyalgia.   Loletta Specteroger David Avyukth Bontempo PA

## 2018-09-20 LAB — COMPREHENSIVE METABOLIC PANEL
ALT: 11 IU/L (ref 0–32)
AST: 18 IU/L (ref 0–40)
Albumin/Globulin Ratio: 1.6 (ref 1.2–2.2)
Albumin: 4.4 g/dL (ref 3.5–5.5)
Alkaline Phosphatase: 72 IU/L (ref 39–117)
BUN/Creatinine Ratio: 11 (ref 9–23)
BUN: 10 mg/dL (ref 6–20)
Bilirubin Total: 0.4 mg/dL (ref 0.0–1.2)
CALCIUM: 9.1 mg/dL (ref 8.7–10.2)
CO2: 22 mmol/L (ref 20–29)
CREATININE: 0.92 mg/dL (ref 0.57–1.00)
Chloride: 101 mmol/L (ref 96–106)
GFR calc Af Amer: 95 mL/min/{1.73_m2} (ref >59)
GFR calc non Af Amer: 83 mL/min/{1.73_m2} (ref >59)
Globulin, Total: 2.7 g/dL (ref 1.5–4.5)
Glucose: 89 mg/dL (ref 65–99)
Potassium: 4.5 mmol/L (ref 3.5–5.2)
Sodium: 138 mmol/L (ref 134–144)
Total Protein: 7.1 g/dL (ref 6.0–8.5)

## 2018-09-20 LAB — CBC WITH DIFFERENTIAL/PLATELET
BASOS: 0 %
Basophils Absolute: 0 10*3/uL (ref 0.0–0.2)
EOS (ABSOLUTE): 0.1 10*3/uL (ref 0.0–0.4)
EOS: 1 %
HEMATOCRIT: 41 % (ref 34.0–46.6)
Hemoglobin: 14 g/dL (ref 11.1–15.9)
IMMATURE GRANS (ABS): 0 10*3/uL (ref 0.0–0.1)
IMMATURE GRANULOCYTES: 0 %
LYMPHS: 23 %
Lymphocytes Absolute: 1.8 10*3/uL (ref 0.7–3.1)
MCH: 32.6 pg (ref 26.6–33.0)
MCHC: 34.1 g/dL (ref 31.5–35.7)
MCV: 95 fL (ref 79–97)
Monocytes Absolute: 0.3 10*3/uL (ref 0.1–0.9)
Monocytes: 4 %
NEUTROS PCT: 72 %
Neutrophils Absolute: 5.5 10*3/uL (ref 1.4–7.0)
Platelets: 286 10*3/uL (ref 150–450)
RBC: 4.3 x10E6/uL (ref 3.77–5.28)
RDW: 12.5 % (ref 12.3–15.4)
WBC: 7.7 10*3/uL (ref 3.4–10.8)

## 2018-09-20 LAB — THYROID PANEL WITH TSH
Free Thyroxine Index: 1.5 (ref 1.2–4.9)
T3 Uptake Ratio: 24 % (ref 24–39)
T4, Total: 6.1 ug/dL (ref 4.5–12.0)
TSH: 17.89 u[IU]/mL — ABNORMAL HIGH (ref 0.450–4.500)

## 2018-09-22 ENCOUNTER — Other Ambulatory Visit (INDEPENDENT_AMBULATORY_CARE_PROVIDER_SITE_OTHER): Payer: Self-pay | Admitting: Physician Assistant

## 2018-09-22 MED ORDER — LEVOTHYROXINE SODIUM 125 MCG PO TABS: 125.0000 ug | ORAL_TABLET | Freq: Every day | ORAL | 1 refills | Status: DC

## 2018-09-23 ENCOUNTER — Telehealth (INDEPENDENT_AMBULATORY_CARE_PROVIDER_SITE_OTHER): Payer: Self-pay

## 2018-09-23 NOTE — Telephone Encounter (Signed)
Patient returned call. She is aware that she needs a small increase in synthroid so 125 mcg was sent to [facility] on [address]. And all other labs normal. Maryjean Mornempestt S Shaelee Forni, CMA

## 2018-09-23 NOTE — Telephone Encounter (Signed)
Left message asking patient to return call to [facility] at [phone]. Shavonte Zhao S Charlann Wayne, CMA  

## 2018-09-23 NOTE — Telephone Encounter (Signed)
-----   Message from Roger David Gomez, PA-C sent at 09/22/2018 11:59 AM EST ----- Needs a small increase of Synthroid. I have sent 125 mcg to [facility] on [address]. All other labs normal. 

## 2018-09-23 NOTE — Telephone Encounter (Signed)
-----   Message from Loletta Specteroger David Gomez, PA-C sent at 09/22/2018 11:59 AM EST ----- Needs a small increase of Synthroid. [facility] [address]. All other labs normal.

## 2018-10-01 ENCOUNTER — Ambulatory Visit (INDEPENDENT_AMBULATORY_CARE_PROVIDER_SITE_OTHER): Payer: Self-pay

## 2018-10-01 DIAGNOSIS — Z950 Presence of cardiac pacemaker: Secondary | ICD-10-CM

## 2018-10-01 DIAGNOSIS — R55 Syncope and collapse: Secondary | ICD-10-CM

## 2018-10-02 LAB — CUP PACEART REMOTE DEVICE CHECK
Implantable Lead Implant Date: 20060119
Implantable Lead Implant Date: 20060119
Implantable Lead Location: 753860
Implantable Lead Serial Number: 439053
Implantable Lead Serial Number: 457210
Implantable Pulse Generator Implant Date: 20180926
MDC IDC LEAD LOCATION: 753859
MDC IDC SESS DTM: 20200109194441
Pulse Gen Serial Number: 789100

## 2018-10-02 NOTE — Progress Notes (Signed)
Remote pacemaker transmission.   

## 2018-10-15 ENCOUNTER — Encounter: Payer: Self-pay | Admitting: Cardiovascular Disease

## 2018-10-15 ENCOUNTER — Ambulatory Visit (INDEPENDENT_AMBULATORY_CARE_PROVIDER_SITE_OTHER): Payer: No Typology Code available for payment source | Admitting: Cardiovascular Disease

## 2018-10-15 VITALS — BP 122/88 | HR 81 | Ht 61.0 in | Wt 153.4 lb

## 2018-10-15 DIAGNOSIS — Z950 Presence of cardiac pacemaker: Secondary | ICD-10-CM

## 2018-10-15 DIAGNOSIS — R55 Syncope and collapse: Secondary | ICD-10-CM

## 2018-10-15 DIAGNOSIS — E039 Hypothyroidism, unspecified: Secondary | ICD-10-CM

## 2018-10-15 NOTE — Progress Notes (Signed)
Cardiology Office Note    Date:  10/15/2018   ID:  Smitty KnudsenQuionna M Rowe, DOB 03-01-1986, MRN [id number]  PCP:  Loletta SpecterGomez, Roger David, PA-C  Cardiologist:   Thurmon FairMihai Jacquel Redditt, MD   Chief Complaint  Patient presents with  . Pacemaker Check    sharp pain in chest area  Pacemaker generator change out/neurocardiogenic syncope  History of Present Illness:  Jean DoeQuionna M Rowe is a 33 y.o. 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   Since the last appointment she has not had any syncopal events, although she has occasional episodes of mild lightheadedness.  The device has recorded several episodes of high atrial rates which all appear consistent with sinus tachycardia.  Sudden bradycardia responses are seen.  Pacemaker interrogation shows normal device function. She only has 5% atrial pacing and never requires ventricular pacing. Lead parameters remain excellent. Estimated generator longevity is 13.5 years.  She has occasional sharp chest pains that occur at rest and are never associated with activity.  Otherwise no cardiovascular complaints. The patient specifically denies any chest pain with exertion, dyspnea at rest or with exertion, orthopnea, paroxysmal nocturnal dyspnea, syncope, palpitations, focal neurological deficits, intermittent claudication, lower extremity edema, unexplained weight gain, cough, hemoptysis or wheezing.    Past Medical History:  Diagnosis Date  . Acne   . Anxiety    panic attacks  . Daily headache   . Heart murmur   . History of IBS   . History of kidney infection   . Hypothyroidism   . Neurocardiogenic syncope   . Neurocardiogenic syncope   . Orthopnoea   . 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 Panic attacks   . Symptomatic bradycardia   . Thyroid disease      Past Surgical History:  Procedure Laterality Date  . DILATION AND CURETTAGE OF UTERUS  2013  . INSERT / REPLACE / REMOVE PACEMAKER  09/2004  . PPM GENERATOR CHANGEOUT N/A 06/19/2017   Procedure: PPM GENERATOR CHANGEOUT;  Surgeon: Thurmon Fairroitoru, Cornell Gaber, MD;  Location: [facility];  Service: Cardiovascular;  Laterality: N/A;  . THYROIDECTOMY  11/04/2013  . THYROIDECTOMY N/A 11/04/2013   Procedure: THYROIDECTOMY;  Surgeon: Ernestene MentionHaywood M Ingram, MD;  [facility];  Service: General;  Laterality: N/A;    Current Medications: Outpatient Medications Prior to Visit  Medication Sig Dispense Refill  . levothyroxine (SYNTHROID, LEVOTHROID) 125 MCG tablet Take 1 tablet (125 mcg total) by mouth daily before breakfast. 90 tablet 1  . DULoxetine (CYMBALTA) 30 MG capsule Take 1 capsule (30 mg total) by mouth daily. 30 capsule 3  . gabapentin (NEURONTIN) 100 MG capsule Take 1 capsule (100 mg total) by mouth 3 (three) times daily. 90 capsule 3  . hyoscyamine (LEVSIN, ANASPAZ) 0.125 MG tablet Take 1 tablet (0.125 mg total) by mouth every 4 (four) hours as needed for up to 5 days. 30 tablet 0   No facility-administered medications prior to visit.      Allergies:   Banana; Other; Latex; and Penicillins   Social History   Socioeconomic History  . Marital status: Single    Spouse name: Not on file  . Number of children: Not on file  . Years of education: Not on file  . Highest education level: Not on file  Occupational History  . Not on file  Social Needs  . Financial  resource strain: Not on file  . Food insecurity:    Worry: Not on file    Inability: Not on file  . Transportation needs:    Medical: Not on file    Non-medical: Not on file  Tobacco Use  . Smoking status: Current Every Day Smoker    Years: 0.50    Types: Cigars  . Smokeless tobacco: Never Used  . Tobacco comment: 11/04/2013 "smoke 3 black N milds/day"  Substance and Sexual Activity  . Alcohol use: Yes    Comment: 11/04/2013  "mixed drinks or wine once a month  . Drug use: No    Types: Marijuana    Comment: none for 6 mos. since 03/2014  . Sexual activity: Yes    Birth control/protection: I.U.D.  Lifestyle  . Physical activity:    Days per week: Not on file    Minutes per session: Not on file  . Stress: Not on file  Relationships  . Social connections:    Talks on phone: Not on file    Gets together: Not on file    Attends religious service: Not on file    Active member of club or organization: Not on file    Attends meetings of clubs or organizations: Not on file    Relationship status: Not on file  Other Topics Concern  . Not on file  Social History Narrative   Epworth Sleepiness Scale = 12 (as of 10/22/2016)     Family History:  The patient's family history includes Heart disease in her mother; Heart failure in her maternal grandmother; Hypertension in her maternal grandfather and maternal grandmother; Thyroid disease in her maternal grandmother, maternal uncle, and mother.   ROS:   Please see the history of present illness.    ROS All other systems reviewed and are negative.   PHYSICAL EXAM:   VS:  BP 122/88   Pulse 81   Ht 5\' 1"  (1.549 m)   Wt 153 lb 6.4 oz (69.6 kg)   BMI 28.98 kg/m     General: Alert, oriented x3, no distress, mildly obese Head: no evidence of trauma, PERRL, EOMI, no exophtalmos or lid lag, no myxedema, no xanthelasma; normal ears, nose and oropharynx Neck: normal jugular venous pulsations and no hepatojugular reflux; brisk carotid pulses without delay and no carotid bruits Chest: clear to auscultation, no signs of consolidation by percussion or palpation, normal fremitus, symmetrical and full respiratory excursions. Well-healed left subclavian pacemaker scar but with some keloid formation. Cardiovascular: normal position and quality of the apical impulse, regular rhythm, normal first and second heart sounds, no murmurs, rubs or gallops Abdomen: no tenderness or  distention, no masses by palpation, no abnormal pulsatility or arterial bruits, normal bowel sounds, no hepatosplenomegaly Extremities: no clubbing, cyanosis or edema; 2+ radial, ulnar and brachial pulses bilaterally; 2+ right femoral, posterior tibial and dorsalis pedis pulses; 2+ left femoral, posterior tibial and dorsalis pedis pulses; no subclavian or femoral bruits Neurological: grossly nonfocal Psych: Normal mood and affect  Wt Readings from Last 3 Encounters:  10/15/18 153 lb 6.4 oz (69.6 kg)  09/19/18 156 lb 9.6 oz (71 kg)  10/04/17 156 lb (70.8 kg)      Studies/Labs Reviewed:   EKG:  EKG is ordered today.  It shows normal sinus rhythm, normal tracing Recent Labs: 09/19/2018: ALT 11; BUN 10; Creatinine, Ser 0.92; Hemoglobin 14.0; Platelets 286; Potassium 4.5; Sodium 138; TSH 17.890    ASSESSMENT:    1. Pacemaker - [employer] Insignia  2006   2. Vasovagal near syncope   3. Acquired hypothyroidism      PLAN:  In order of problems listed above:  1. Neurocardiogenic syncope: This has resolved completely following pacemaker implantation, after she unsuccessfully tried treatment with fludrocortisone and beta blockers.  We reviewed the fact that she will continue to have vasodepressive effects and that she needs to stay well-hydrated, avoid natural diuretics like caffeine and alcohol, eat a relatively high salt diet.  Most importantly she should avoid triggers and promptly heed prodromal symptoms, prevent syncope by lying down or crouching.   2. PPM: Normal device function. Plan remote downloads every 3 months and yearly office visits. SBR programmed on fairly aggressively. 3. Hypothyroidism: On supplement, TSH still low.    Medication Adjustments/Labs and Tests Ordered: Current medicines are reviewed at length with the patient today.  Concerns regarding medicines are outlined above.  Medication changes, Labs and Tests ordered today are listed in the Patient Instructions  below. Patient Instructions  Medication Instructions:  Your physician recommends that you continue on your current medications as directed. Please refer to the Current Medication list given to you today.  If you need a refill on your cardiac medications before your next appointment, please call your pharmacy.   Lab work: None ordered If you have labs (blood work) drawn today and your tests are completely normal, you will receive your results only by: Marland Kitchen MyChart Message (if you have MyChart) OR . A paper copy in the mail If you have any lab test that is abnormal or we need to change your treatment, we will call you to review the results.  Testing/Procedures: None ordered  Follow-Up: At [facility], you and your health needs are our priority.  As part of our continuing mission to provide you with exceptional heart care, we have created designated Provider Care Teams.  These Care Teams include your primary Cardiologist (physician) and Advanced Practice Providers (APPs -  Physician Assistants and Nurse Practitioners) who all work together to provide you with the care you need, when you need it. You will need a follow up appointment in 12 months.  Please call our office 2 months in advance to schedule this appointment.  You may see Thurmon Fair, MD or one of the following Advanced Practice Providers on your designated Care Team: [place], [place] . Micah Flesher, PA-C  Remote monitoring is used to monitor your Pacemaker of ICD from home. This monitoring reduces the number of office visits required to check your device to one time per year. It allows [place] to keep an eye on the functioning of your device to ensure it is working properly. You are scheduled for a device check from home on 12/31/18. You may send your transmission at any time that day. If you have a wireless device, the transmission will be sent automatically. After your physician reviews your transmission, you will receive a postcard with  your next transmission date.        Signed, Thurmon Fair, MD  10/15/2018 9:39 AM    [facility] [address] [place], [place], [place]  [address] Phone: [phone]; Fax: [phone]

## 2018-10-15 NOTE — Patient Instructions (Signed)
Medication Instructions:  Your physician recommends that you continue on your current medications as directed. Please refer to the Current Medication list given to you today.  If you need a refill on your cardiac medications before your next appointment, please call your pharmacy.   Lab work: None ordered If you have labs (blood work) drawn today and your tests are completely normal, you will receive your results only by: . MyChart Message (if you have MyChart) OR . A paper copy in the mail If you have any lab test that is abnormal or we need to change your treatment, we will call you to review the results.  Testing/Procedures: None ordered  Follow-Up: At [facility], you and your health needs are our priority.  As part of our continuing mission to provide you with exceptional heart care, we have created designated Provider Care Teams.  These Care Teams include your primary Cardiologist (physician) and Advanced Practice Providers (APPs -  Physician Assistants and Nurse Practitioners) who all work together to provide you with the care you need, when you need it. You will need a follow up appointment in 12 months.  Please call our office 2 months in advance to schedule this appointment.  You may see Mihai Croitoru, MD or one of the following Advanced Practice Providers on your designated Care Team: Hao Meng, PA-C . Angela Duke, PA-C  Remote monitoring is used to monitor your Pacemaker of ICD from home. This monitoring reduces the number of office visits required to check your device to one time per year. It allows us to keep an eye on the functioning of your device to ensure it is working properly. You are scheduled for a device check from home on 12/31/18. You may send your transmission at any time that day. If you have a wireless device, the transmission will be sent automatically. After your physician reviews your transmission, you will receive a postcard with your next transmission  date.       

## 2018-10-16 ENCOUNTER — Other Ambulatory Visit (HOSPITAL_COMMUNITY)
Admission: RE | Admit: 2018-10-16 | Discharge: 2018-10-16 | Disposition: A | Discharge status: Home / Self Care | Payer: No Typology Code available for payment source | Source: Ambulatory Visit | Attending: Family Medicine | Admitting: Family Medicine

## 2018-10-16 ENCOUNTER — Encounter (INDEPENDENT_AMBULATORY_CARE_PROVIDER_SITE_OTHER): Payer: Self-pay | Admitting: Family Medicine

## 2018-10-16 ENCOUNTER — Other Ambulatory Visit: Payer: Self-pay

## 2018-10-16 ENCOUNTER — Ambulatory Visit (INDEPENDENT_AMBULATORY_CARE_PROVIDER_SITE_OTHER): Payer: Self-pay | Admitting: Family Medicine

## 2018-10-16 VITALS — BP 137/82 | HR 66 | Temp 97.9°F | Ht 61.0 in | Wt 153.2 lb

## 2018-10-16 DIAGNOSIS — M797 Fibromyalgia: Secondary | ICD-10-CM

## 2018-10-16 DIAGNOSIS — N760 Acute vaginitis: Secondary | ICD-10-CM | POA: Insufficient documentation

## 2018-10-16 DIAGNOSIS — B9689 Other specified bacterial agents as the cause of diseases classified elsewhere: Secondary | ICD-10-CM

## 2018-10-16 DIAGNOSIS — K580 Irritable bowel syndrome with diarrhea: Secondary | ICD-10-CM

## 2018-10-16 DIAGNOSIS — R55 Syncope and collapse: Secondary | ICD-10-CM

## 2018-10-16 DIAGNOSIS — E890 Postprocedural hypothyroidism: Secondary | ICD-10-CM

## 2018-10-16 DIAGNOSIS — K589 Irritable bowel syndrome without diarrhea: Secondary | ICD-10-CM | POA: Insufficient documentation

## 2018-10-16 MED ORDER — MELOXICAM 7.5 MG PO TABS: 7.5000 mg | ORAL_TABLET | Freq: Every day | ORAL | 1 refills | Status: DC

## 2018-10-16 MED ORDER — DICYCLOMINE HCL 10 MG PO CAPS: 10.0000 mg | ORAL_CAPSULE | Freq: Three times a day (TID) | ORAL | 1 refills | Status: DC

## 2018-10-16 MED ORDER — CYCLOBENZAPRINE HCL 10 MG PO TABS: 10.0000 mg | ORAL_TABLET | Freq: Two times a day (BID) | ORAL | 1 refills | Status: DC | PRN

## 2018-10-16 MED ORDER — METRONIDAZOLE 500 MG PO TABS: 500.0000 mg | ORAL_TABLET | Freq: Two times a day (BID) | ORAL | 0 refills | Status: DC

## 2018-10-16 NOTE — Progress Notes (Signed)
Subjective:  Patient ID: Jean Rowe, female    DOB: [birth year]  Age: 33 y.o. MRN: [id number]  CC: Follow-up (IBS/mood) and Fibromyalgia   HPI Jean Rowe is a 33 year old female with a history of neurogenic syncope (status post pacemaker), hypothyroidism, IBS, fibromyalgia who presents today for follow-up visit. At her last visit with her PCP last month she was commenced on Cymbalta and hyoscyamine for fibromyalgia and IBS respectively however she never commenced any of them due to concerns about the side effects. She would rather be placed on meloxicam and Flexeril which she received in the past by previous PCP of hers. IBS symptoms are uncontrolled with intermittent diarrhea occurring up to 3 times a day, right lower quadrant pain, intermittent nausea but no vomiting, no fever.  With regards to her hypothyroidism she has been out of levothyroxine until her last visit last month at which time her TSH was elevated. Last seen by cardiology yesterday and her pacemaker was last interrogated on 10/01/2018 with no abnormal rhythms detected.  She does have intermittent chest pains which is absent at this time and she discussed with her cardiologist but denies shortness of breath. She has also noticed a vaginal discharge which is whitish with a fishy odor and is typical of bacterial vaginosis which she has had in the past.  Has one sexual partner and does not use protection.  Past Medical History:  Diagnosis Date  . Acne   . Anxiety    panic attacks  . Daily headache   . Heart murmur   . History of IBS   . History of kidney infection   . Hypothyroidism   . Neurocardiogenic syncope   . Neurocardiogenic syncope   . Orthopnoea   . 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 Panic attacks   . Symptomatic bradycardia   . Thyroid disease     Past Surgical History:  Procedure Laterality Date  . DILATION AND CURETTAGE OF UTERUS  2013  . INSERT / REPLACE / REMOVE PACEMAKER   09/2004  . PPM GENERATOR CHANGEOUT N/A 06/19/2017   Procedure: PPM GENERATOR CHANGEOUT;  Surgeon: Thurmon Fair, MD;  Location: [facility];  Service: Cardiovascular;  Laterality: N/A;  . THYROIDECTOMY  11/04/2013  . THYROIDECTOMY N/A 11/04/2013   Procedure: THYROIDECTOMY;  Surgeon: Ernestene Mention, MD;  Location: [facility] OR;  Service: General;  Laterality: N/A;    Allergies  Allergen Reactions  . Banana Anaphylaxis    Swelling of throat  . 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 Latex Rash  . Penicillins Other (See Comments)    Has patient had a PCN reaction causing immediate rash, facial/tongue/throat swelling, SOB or lightheadedness with hypotension: unknown Has patient had a PCN reaction causing severe rash involving mucus membranes or skin necrosis:unknownHas patient had a PCN reaction that required hospitalization unknown Has patient had a PCN reaction occurring within the last 10 years: unknown If all of the above answers are "NO", then may proceed with Cephalosporin use.      Outpatient Medications Prior to Visit  Medication Sig Dispense Refill  . levothyroxine (SYNTHROID, LEVOTHROID) 125 MCG tablet Take 1 tablet (125 mcg total) by mouth daily before breakfast. 90 tablet 1   No facility-administered medications prior to visit.     ROS Review of Systems  Constitutional: Negative for activity change, appetite change and fatigue.  HENT: Negative for congestion, sinus  pressure and sore throat.   Eyes: Negative for visual disturbance.  Respiratory: Negative for cough, chest tightness, shortness of breath and wheezing.   Cardiovascular: Negative for chest pain and palpitations.  Gastrointestinal: Positive for abdominal pain and diarrhea. Negative for abdominal distention and constipation.  Endocrine: Negative for polydipsia.  Genitourinary: Negative for dysuria and frequency.  Musculoskeletal: Negative for  arthralgias and back pain.  Skin: Negative for rash.  Neurological: Negative for tremors, light-headedness and numbness.  Hematological: Does not bruise/bleed easily.  Psychiatric/Behavioral: Negative for agitation and behavioral problems.    Objective:  BP 137/82 (BP Location: Left Arm, Patient Position: Sitting, Cuff Size: Normal)   Pulse 66   Temp 97.9 F (36.6 C) (Oral)   Ht 5\' 1"  (1.549 m)   Wt 153 lb 3.2 oz (69.5 kg)   SpO2 96%   BMI 28.95 kg/m   BP/Weight 10/16/2018 10/15/2018 09/19/2018  Systolic BP 137 122 131  Diastolic BP 82 88 80  Wt. (Lbs) 153.2 153.4 156.6  BMI 28.95 28.98 30.58      Physical Exam Constitutional:      Appearance: She is well-developed.  Cardiovascular:     Rate and Rhythm: Normal rate.     Heart sounds: Normal heart sounds. No murmur.  Pulmonary:     Effort: Pulmonary effort is normal.     Breath sounds: Normal breath sounds. No wheezing or rales.  Chest:     Chest wall: No tenderness.  Abdominal:     General: Bowel sounds are normal. There is no distension.     Palpations: Abdomen is soft. There is no mass.     Tenderness: There is abdominal tenderness (slight RLQ TTP).  Musculoskeletal: Normal range of motion.  Neurological:     Mental Status: She is alert and oriented to person, place, and time.  Psychiatric:        Mood and Affect: Mood normal.     CMP Latest Ref Rng & Units 09/19/2018 06/14/2017 06/04/2017  Glucose 65 - 99 mg/dL 89 72 66  BUN 6 - 20 mg/dL 10 8 10   Creatinine 0.57 - 1.00 mg/dL 1.610.92 0.960.77 0.450.89  Sodium 134 - 144 mmol/L 138 137 141  Potassium 3.5 - 5.2 mmol/L 4.5 4.2 4.1  Chloride 96 - 106 mmol/L 101 100 103  CO2 20 - 29 mmol/L 22 21 21   Calcium 8.7 - 10.2 mg/dL 9.1 9.2 9.1  Total Protein 6.0 - 8.5 g/dL 7.1 - 7.1  Total Bilirubin 0.0 - 1.2 mg/dL 0.4 - 0.3  Alkaline Phos 39 - 117 IU/L 72 - 81  AST 0 - 40 IU/L 18 - 19  ALT 0 - 32 IU/L 11 - 11      Lab Results  Component Value Date   TSH 17.890 (H)  09/19/2018     Assessment & Plan:   1. Postsurgical hypothyroidism Uncontrolled with last TSH which was elevated and dose adjusted accordingly Continue levothyroxine We will need repeat TSH at upcoming visit  2. Neurocardiogenic syncope Status post pacemaker Followed by cardiology  3. Fibromyalgia Uncontrolled Placed on meloxicam and Flexeril as per request - meloxicam (MOBIC) 7.5 MG tablet; Take 1 tablet (7.5 mg total) by mouth daily.  Dispense: 30 tablet; Refill: 1 - cyclobenzaprine (FLEXERIL) 10 MG tablet; Take 1 tablet (10 mg total) by mouth 2 (two) times daily as needed for muscle spasms.  Dispense: 60 tablet; Refill: 1  4. Irritable bowel syndrome with diarrhea Uncontrolled Commence Bentyl She does have right lower quadrant pain;  if symptoms persist we might need to order abdominal imaging Advised to apply for the [facility] financial discount which will be needed in the event that imaging is indicated - dicyclomine (BENTYL) 10 MG capsule; Take 1 capsule (10 mg total) by mouth 4 (four) times daily -  before meals and at bedtime.  Dispense: 90 capsule; Refill: 1  5. Bacterial vaginosis We will treat presumptively - Urine cytology ancillary only - metroNIDAZOLE (FLAGYL) 500 MG tablet; Take 1 tablet (500 mg total) by mouth 2 (two) times daily.  Dispense: 14 tablet; Refill: 0   Meds ordered this encounter  Medications  . dicyclomine (BENTYL) 10 MG capsule    Sig: Take 1 capsule (10 mg total) by mouth 4 (four) times daily -  before meals and at bedtime.    Dispense:  90 capsule    Refill:  1  . meloxicam (MOBIC) 7.5 MG tablet    Sig: Take 1 tablet (7.5 mg total) by mouth daily.    Dispense:  30 tablet    Refill:  1  . cyclobenzaprine (FLEXERIL) 10 MG tablet    Sig: Take 1 tablet (10 mg total) by mouth 2 (two) times daily as needed for muscle spasms.    Dispense:  60 tablet    Refill:  1  . metroNIDAZOLE (FLAGYL) 500 MG tablet    Sig: Take 1 tablet (500 mg total) by mouth 2  (two) times daily.    Dispense:  14 tablet    Refill:  0    Follow-up: Return in about 6 weeks (around 11/27/2018) for Follow-up of hypothyroidism.   Hoy Register MD

## 2018-10-16 NOTE — Patient Instructions (Signed)
Diet for Irritable Bowel Syndrome  When you have irritable bowel syndrome (IBS), it is very important to eat the foods and follow the eating habits that are best for your condition. IBS may cause various symptoms such as pain in the abdomen, constipation, or diarrhea. Choosing the right foods can help to ease the discomfort from these symptoms. Work with your health care provider and diet and nutrition specialist (dietitian) to find the eating plan that will help to control your symptoms.  What are tips for following this plan?         · Keep a food diary. This will help you identify foods that cause symptoms. Write down:  ? What you eat and when you eat it.  ? What symptoms you have.  ? When symptoms occur in relation to your meals, such as "pain in abdomen 2 hours after dinner."  · Eat your meals slowly and in a relaxed setting.  · Aim to eat 5-6 small meals per day. Do not skip meals.  · Drink enough fluid to keep your urine pale yellow.  · Ask your health care provider if you should take an over-the-counter probiotic to help restore healthy bacteria in your gut (digestive tract).  ? Probiotics are foods that contain good bacteria and yeasts.  · Your dietitian may have specific dietary recommendations for you based on your symptoms. He or she may recommend that you:  ? Avoid foods that cause symptoms. Talk with your dietitian about other ways to get the same nutrients that are in those problem foods.  ? Avoid foods with gluten. Gluten is a protein that is found in rye, wheat, and barley.  ? Eat more foods that contain soluble fiber. Examples of foods with high soluble fiber include oats, seeds, and certain fruits and vegetables. Take a fiber supplement if directed by your dietitian.  ? Reduce or avoid certain foods called FODMAPs. These are foods that contain carbohydrates that are hard to digest. Ask your doctor which foods contain these carbohydrates.  What foods are not recommended?  The following are some  foods and drinks that may make your symptoms worse:  · Fatty foods, such as french fries.  · Foods that contain gluten, such as pasta and cereal.  · Dairy products, such as milk, cheese, and ice cream.  · Chocolate.  · Alcohol.  · Products with caffeine, such as coffee.  · Carbonated drinks, such as soda.  · Foods that are high in FODMAPs. These include certain fruits and vegetables.  · Products with sweeteners such as honey, high fructose corn syrup, sorbitol, and mannitol.  The items listed above may not be a complete list of foods and beverages you should avoid. Contact a dietitian for more information.  What foods are good sources of fiber?  Your health care provider or dietitian may recommend that you eat more foods that contain fiber. Fiber can help to reduce constipation and other IBS symptoms. Add foods with fiber to your diet a little at a time so your body can get used to them. Too much fiber at one time might cause gas and swelling of your abdomen. The following are some foods that are good sources of fiber:  · Berries, such as raspberries, strawberries, and blueberries.  · Tomatoes.  · Carrots.  · Brown rice.  · Oats.  · Seeds, such as chia and pumpkin seeds.  The items listed above may not be a complete list of recommended sources of fiber. Contact   your dietitian for more options.  Where to find more information  · International Foundation for Functional Gastrointestinal Disorders: www.iffgd.org  · National Institute of Diabetes and Digestive and Kidney Diseases: www.niddk.nih.gov  Summary  · When you have irritable bowel syndrome (IBS), it is very important to eat the foods and follow the eating habits that are best for your condition.  · IBS may cause various symptoms such as pain in the abdomen, constipation, or diarrhea.  · Choosing the right foods can help to ease the discomfort that comes from symptoms.  · Keep a food diary. This will help you identify foods that cause symptoms.  · Your health  care provider or diet and nutrition specialist (dietitian) may recommend that you eat more foods that contain fiber.  This information is not intended to replace advice given to you by your health care provider. Make sure you discuss any questions you have with your health care provider.  Document Released: 12/01/2003 Document Revised: 04/07/2018 Document Reviewed: 05/14/2017  Elsevier Interactive Patient Education © 2019 [employer]

## 2018-10-17 LAB — URINE CYTOLOGY ANCILLARY ONLY
Chlamydia: NEGATIVE
Neisseria Gonorrhea: NEGATIVE
Trichomonas: NEGATIVE

## 2018-10-20 LAB — URINE CYTOLOGY ANCILLARY ONLY
Bacterial vaginitis: POSITIVE — AB
CANDIDA VAGINITIS: NEGATIVE

## 2018-10-24 ENCOUNTER — Telehealth: Payer: Self-pay

## 2018-10-24 NOTE — Telephone Encounter (Signed)
Patient was called and voicemail is not set up to leave a message. 

## 2018-10-24 NOTE — Telephone Encounter (Signed)
-----   Message from Hoy Register, MD sent at 10/21/2018  4:51 PM EST ----- Urine test was initially negative but this was recently edited by the lab and came back positive for bacterial vaginosis.  She received a prescription for metronidazole at last office visit; no additional changes need to be made at this time.

## 2018-10-31 LAB — CUP PACEART INCLINIC DEVICE CHECK
Date Time Interrogation Session: 20200207101001
Implantable Lead Implant Date: 20060119
Implantable Lead Implant Date: 20060119
Implantable Lead Location: 753859
Implantable Lead Location: 753860
Implantable Lead Model: 4456
Implantable Lead Model: 4469
Implantable Lead Serial Number: 457210
Implantable Pulse Generator Implant Date: 20180926
MDC IDC LEAD SERIAL: 439053
Pulse Gen Serial Number: 789100

## 2018-11-27 ENCOUNTER — Ambulatory Visit (INDEPENDENT_AMBULATORY_CARE_PROVIDER_SITE_OTHER): Payer: No Typology Code available for payment source | Admitting: Primary Care

## 2018-12-15 ENCOUNTER — Telehealth: Payer: Self-pay | Admitting: Cardiovascular Disease

## 2018-12-15 NOTE — Telephone Encounter (Signed)
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 She need a note to be out of work until the virus is not in a strong affect or until it's safe. Pls advise.

## 2018-12-15 NOTE — Telephone Encounter (Signed)
Pt aware of Dr Croitoru's response and recommendations .Jean Rowe

## 2018-12-15 NOTE — Telephone Encounter (Signed)
Pt aware will forward to  Dr Royann Shivers for review  ./CY

## 2018-12-15 NOTE — Telephone Encounter (Signed)
While I do understand [name] concerns, I do not think a note from her cardiologist is appropriate.  Clinic type of cardiac problems do not really place her at an increased risk of complications from the virus, compared with other folks of her age.  I suggest that she discusses her concerns with her employer, but a letter from me would not be appropriate.

## 2018-12-18 ENCOUNTER — Other Ambulatory Visit (INDEPENDENT_AMBULATORY_CARE_PROVIDER_SITE_OTHER): Payer: No Typology Code available for payment source

## 2018-12-18 ENCOUNTER — Ambulatory Visit (INDEPENDENT_AMBULATORY_CARE_PROVIDER_SITE_OTHER): Payer: No Typology Code available for payment source | Admitting: Primary Care

## 2018-12-23 ENCOUNTER — Telehealth: Payer: Self-pay | Admitting: Physician Assistant

## 2018-12-23 ENCOUNTER — Encounter (HOSPITAL_COMMUNITY): Payer: Self-pay

## 2018-12-23 ENCOUNTER — Other Ambulatory Visit: Payer: Self-pay

## 2018-12-23 ENCOUNTER — Ambulatory Visit (HOSPITAL_COMMUNITY)
Admission: EM | Admit: 2018-12-23 | Discharge: 2018-12-23 | Disposition: A | Discharge status: Home / Self Care | Payer: Self-pay | Attending: Family Medicine | Admitting: Family Medicine

## 2018-12-23 DIAGNOSIS — B028 Zoster with other complications: Secondary | ICD-10-CM

## 2018-12-23 MED ORDER — VALACYCLOVIR HCL 1 G PO TABS: 1000.0000 mg | ORAL_TABLET | Freq: Three times a day (TID) | ORAL | 0 refills | Status: DC

## 2018-12-23 MED ORDER — HYDROCODONE-ACETAMINOPHEN 5-325 MG PO TABS: 1.0000 | ORAL_TABLET | Freq: Four times a day (QID) | ORAL | 0 refills | Status: DC | PRN

## 2018-12-23 NOTE — Telephone Encounter (Signed)
New Message   Pt states that she is having an allergic reaction and the the side of her face is swollen. Pt is requesting Prednisone. Please f/u

## 2018-12-23 NOTE — ED Provider Notes (Signed)
[facility]    CSN: [id number] Arrival date & time: 12/23/18  1414     History   Chief Complaint Chief Complaint  Patient presents with  . Facial Pain  . Facial Swelling    HPI Jean Rowe is a 33 y.o. female.   HPI  Patient has a rash, pain, and swelling on the right side of her forehead.  She has pain in her scalp and down into her face.  Her right eyelid looks like it might be a little bit drooping.  She called her primary care doctor today and they advised her to come to the emergency room or urgent care for evaluation.  Based on her history there existed concern for possible Bell's palsy.  Patient does not have any numbness.  She does not have any weakness.  She thought this painful rash was perhaps an insect bite.  She states that she had chickenpox more than once as a child.  She has never had shingles. She has well-controlled hypothyroidism.  She has a primary care doctor that she follows with.  She is up-to-date with her health maintenance, per patient.  Past Medical History:  Diagnosis Date  . Acne   . Anxiety    panic attacks  . Daily headache   . Heart murmur   . History of IBS   . History of kidney infection   . Hypothyroidism   . Neurocardiogenic syncope   . Neurocardiogenic syncope   . Orthopnoea   . 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 Panic attacks   . Symptomatic bradycardia   . Thyroid disease     Patient Active Problem List   Diagnosis Date Noted  . Fibromyalgia 10/16/2018  . Irritable bowel syndrome 10/16/2018  . Pacemaker battery depletion   . Paresthesia 09/01/2014  . 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 Neurocardiogenic syncope 06/23/2013  . Pacemaker - [employer] 2006 06/23/2013    Past Surgical History:  Procedure Laterality Date  . DILATION AND CURETTAGE OF UTERUS  2013  . INSERT / REPLACE / REMOVE PACEMAKER  09/2004  . PPM GENERATOR  CHANGEOUT N/A 06/19/2017   Procedure: PPM GENERATOR CHANGEOUT;  Surgeon: Thurmon Fair, MD;  Location: [facility];  Service: Cardiovascular;  Laterality: N/A;  . THYROIDECTOMY  11/04/2013  . THYROIDECTOMY N/A 11/04/2013   Procedure: THYROIDECTOMY;  Surgeon: Ernestene Mention, MD;  Location: [facility] OR;  Service: General;  Laterality: N/A;    OB History    Gravida  2   Para  2   Term      Preterm      AB      Living  2     SAB      TAB      Ectopic      Multiple      Live Births               Home Medications    Prior to Admission medications   Medication Sig Start Date End Date Taking? Authorizing Provider  cyclobenzaprine (FLEXERIL) 10 MG tablet Take 1 tablet (10 mg total) by mouth 2 (two) times daily as needed for muscle spasms. 10/16/18   Hoy Register, MD  dicyclomine (BENTYL) 10 MG capsule Take 1 capsule (10 mg total) by mouth 4 (four) times daily -  before meals and at bedtime. 10/16/18   Hoy Register, MD  HYDROcodone-acetaminophen (NORCO/VICODIN) 5-325 MG tablet Take 1-2 tablets  by mouth every 6 (six) hours as needed. 12/23/18   Eustace Moore, MD  levothyroxine Erline Levine, LEVOTHROID) 125 MCG tablet Take 1 tablet (125 mcg total) by mouth daily before breakfast. 09/22/18   Loletta Specter, PA-C  meloxicam (MOBIC) 7.5 MG tablet Take 1 tablet (7.5 mg total) by mouth daily. 10/16/18   Hoy Register, MD  valACYclovir (VALTREX) 1000 MG tablet Take 1 tablet (1,000 mg total) by mouth 3 (three) times daily. 12/23/18   Eustace Moore, MD    Family History Family History  Problem Relation Age of Onset  . Thyroid disease Mother   . Heart disease Mother   . Thyroid disease Maternal Uncle   . Thyroid disease Maternal Grandmother   . Heart failure Maternal Grandmother   . Hypertension Maternal Grandmother   . Hypertension Maternal Grandfather     Social History Social History   Tobacco Use  . Smoking status: Current Every Day Smoker    Years:  0.50    Types: Cigars  . Smokeless tobacco: Never Used  . Tobacco comment: 11/04/2013 "smoke 3 black N milds/day"  Substance Use Topics  . Alcohol use: Yes    Comment: 11/04/2013 "mixed drinks or wine once a month  . Drug use: No    Types: Marijuana    Comment: none for 6 mos. since 03/2014     Allergies   Banana; Other; Latex; and Penicillins   Review of Systems Review of Systems  Constitutional: Negative for chills and fever.  HENT: Negative for ear pain and sore throat.   Eyes: Negative for pain and visual disturbance.  Respiratory: Negative for cough and shortness of breath.   Cardiovascular: Negative for chest pain and palpitations.  Gastrointestinal: Negative for abdominal pain and vomiting.  Genitourinary: Negative for dysuria and hematuria.  Musculoskeletal: Negative for arthralgias and back pain.  Skin: Positive for rash. Negative for color change.  Neurological: Negative for seizures and syncope.  All other systems reviewed and are negative.    Physical Exam Triage Vital Signs ED Triage Vitals  Enc Vitals Group     BP 12/23/18 1436 135/89     Pulse Rate 12/23/18 1436 (!) 105     Resp 12/23/18 1436 20     Temp 12/23/18 1436 98.2 F (36.8 C)     Temp Source 12/23/18 1436 Oral     SpO2 12/23/18 1436 99 %     Weight --      Height --      Head Circumference --      Peak Flow --      Pain Score 12/23/18 1437 8     Pain Loc --      Pain Edu? --      Excl. in GC? --    No data found.  Updated Vital Signs BP 135/89 (BP Location: Right Arm)   Pulse (!) 105   Temp 98.2 F (36.8 C) (Oral)   Resp 20   SpO2 99%       Physical Exam Constitutional:      General: She is not in acute distress.    Appearance: She is well-developed.  HENT:     Head: Normocephalic and atraumatic.   Eyes:     Conjunctiva/sclera: Conjunctivae normal.     Pupils: Pupils are equal, round, and reactive to light.  Neck:     Musculoskeletal: Normal range of motion.   Cardiovascular:     Rate and Rhythm: Normal rate.  Pulmonary:  Effort: Pulmonary effort is normal. No respiratory distress.  Abdominal:     General: There is no distension.     Palpations: Abdomen is soft.  Musculoskeletal: Normal range of motion.  Skin:    General: Skin is warm and dry.  Neurological:     Mental Status: She is alert.      UC Treatments / Results  Labs (all labs ordered are listed, but only abnormal results are displayed) Labs Reviewed - No data to display  EKG None  Radiology No results found.  Procedures Procedures (including critical care time)  Medications Ordered in UC Medications - No data to display  Initial Impression / Assessment and Plan / UC Course  I have reviewed the triage vital signs and the nursing notes.  Pertinent labs & imaging results that were available during my care of the patient were reviewed by me and considered in my medical decision making (see chart for details).     I explained to this patient the painful vesicles in this distribution are shingles.  She is to watch for any involvement of her eye.  She has a tiny bit of swelling in the right upper eyelid that makes it appeared drooping.  There is no conjunctival injection or discomfort in the eye.  Visual acuity is grossly normal. Final Clinical Impressions(s) / UC Diagnoses   Final diagnoses:  Herpes zoster with complication     Discharge Instructions     Take the valacyclovir as directed Get 2 doses in today Take ibuprofen or acetaminophen for moderate pain Take hydrocodone for severe pain Do not drive on hydrocodone Follow up with your PCP in a few days CALL or go to ER if it affects your eye It is potentially contagious until all of the blisters crust over   ED Prescriptions    Medication Sig Dispense Auth. Provider   valACYclovir (VALTREX) 1000 MG tablet Take 1 tablet (1,000 mg total) by mouth 3 (three) times daily. 21 tablet Eustace Moore, MD    HYDROcodone-acetaminophen (NORCO/VICODIN) 5-325 MG tablet Take 1-2 tablets by mouth every 6 (six) hours as needed. 12 tablet Eustace Moore, MD     Controlled Substance Prescriptions [place] Controlled Substance Registry consulted? yes   Eustace Moore, MD 12/23/18 [phone]

## 2018-12-23 NOTE — Telephone Encounter (Signed)
Patient called with concerns of right side of face swollen. States she feels like it was a spider bite but unsure. Asked patient did her face droop -YES, and change in speech - Yes and a Headache from top to bottom.  Treatment denied she requested prednisone . Advised her to put on a mask and take antibacterial hand sanitizer proceed to Urgent care r/o Bells Palsy

## 2018-12-23 NOTE — ED Triage Notes (Signed)
Pt presents with facial pain and swelling X 2 days from what patient believes to be insect bite.

## 2018-12-23 NOTE — Discharge Instructions (Signed)
Take the valacyclovir as directed Get 2 doses in today Take ibuprofen or acetaminophen for moderate pain Take hydrocodone for severe pain Do not drive on hydrocodone Follow up with your PCP in a few days CALL or go to ER if it affects your eye It is potentially contagious until all of the blisters crust over

## 2018-12-25 ENCOUNTER — Encounter (HOSPITAL_COMMUNITY): Payer: Self-pay | Admitting: Emergency Medicine

## 2018-12-25 ENCOUNTER — Ambulatory Visit (HOSPITAL_COMMUNITY)
Admission: EM | Admit: 2018-12-25 | Discharge: 2018-12-25 | Disposition: A | Discharge status: Home / Self Care | Payer: Self-pay | Attending: Family Medicine | Admitting: Family Medicine

## 2018-12-25 ENCOUNTER — Other Ambulatory Visit: Payer: Self-pay

## 2018-12-25 DIAGNOSIS — B028 Zoster with other complications: Secondary | ICD-10-CM

## 2018-12-25 MED ORDER — OXYCODONE-ACETAMINOPHEN 5-325 MG PO TABS: 1.0000 | ORAL_TABLET | Freq: Four times a day (QID) | ORAL | 0 refills | Status: DC | PRN

## 2018-12-25 NOTE — ED Triage Notes (Signed)
On Monday, patient had headache on right side of head.  Sharp pain, intermittent on Monday.  Facial swelling noticed on Tuesday, swelling worse on right side of face.  No fever.  On Tuesday noticed swelling starting at top of head and slowly moving down right side of face and seen at [facility]  12/23/2018.  Diagnosed with shingles and told to return if swelling worsened to right eye.  Now right eye is swollen shut and headache continues

## 2018-12-25 NOTE — ED Notes (Signed)
Patient verbalizes understanding of discharge instructions. Opportunity for questioning and answers were provided. Patient discharged from [facility] by MD. 

## 2018-12-25 NOTE — Discharge Instructions (Addendum)
Be aware, pain medications may cause drowsiness. Please do not drive, operate heavy machinery or make important decisions while on this medication, it can cloud your judgement.  

## 2018-12-27 ENCOUNTER — Other Ambulatory Visit: Payer: Self-pay

## 2018-12-27 ENCOUNTER — Encounter (HOSPITAL_COMMUNITY): Payer: Self-pay | Admitting: Emergency Medicine

## 2018-12-27 ENCOUNTER — Emergency Department (HOSPITAL_COMMUNITY)
Admission: EM | Admit: 2018-12-27 | Discharge: 2018-12-27 | Disposition: A | Discharge status: Home / Self Care | Payer: No Typology Code available for payment source | Attending: Emergency Medicine | Admitting: Emergency Medicine

## 2018-12-27 DIAGNOSIS — Z79899 Other long term (current) drug therapy: Secondary | ICD-10-CM | POA: Insufficient documentation

## 2018-12-27 DIAGNOSIS — B0222 Postherpetic trigeminal neuralgia: Secondary | ICD-10-CM

## 2018-12-27 DIAGNOSIS — F17210 Nicotine dependence, cigarettes, uncomplicated: Secondary | ICD-10-CM | POA: Insufficient documentation

## 2018-12-27 DIAGNOSIS — E039 Hypothyroidism, unspecified: Secondary | ICD-10-CM | POA: Insufficient documentation

## 2018-12-27 MED ORDER — PREDNISONE 20 MG PO TABS: 40.0000 mg | ORAL_TABLET | Freq: Every day | ORAL | 0 refills | Status: DC

## 2018-12-27 MED ORDER — GABAPENTIN 300 MG PO CAPS: 300.0000 mg | ORAL_CAPSULE | Freq: Three times a day (TID) | ORAL | 0 refills | Status: DC

## 2018-12-27 MED ORDER — FLUORESCEIN SODIUM 1 MG OP STRP
1.0000 | ORAL_STRIP | Freq: Once | OPHTHALMIC | Status: AC
  Administered 2018-12-27: 1 via OPHTHALMIC
  Filled 2018-12-27: qty 1

## 2018-12-27 MED ORDER — TETRACAINE HCL 0.5 % OP SOLN
1.0000 [drp] | Freq: Once | OPHTHALMIC | Status: AC
  Administered 2018-12-27: 1 [drp] via OPHTHALMIC
  Filled 2018-12-27: qty 4

## 2018-12-27 NOTE — ED Triage Notes (Signed)
Pt dx shingles on right forehead 3-3, right eye swollen shut.  Was given Valtrex for same.

## 2018-12-27 NOTE — Discharge Instructions (Signed)
Take ibuprofen 600 mg every 6 hours as needed for pain. It is safe to take together with your other pain medication.

## 2018-12-27 NOTE — ED Notes (Signed)
Patient verbalizes understanding of discharge instructions. Opportunity for questioning and answers were provided. Armband removed by staff, pt discharged from ED.  

## 2018-12-27 NOTE — ED Provider Notes (Signed)
[facility] EMERGENCY DEPARTMENT Provider Note   CSN: [id number] Arrival date & time: 12/27/18  1241    History   Chief Complaint Chief Complaint  Patient presents with  . Herpes Zoster    HPI Jean Rowe is a 33 y.o. female.     HPI   33yF with R eye swelling. Recently diagnosed with shingles on face. Says she has been seen by PCP and started on valtrex and pain medication. Says she also went to ophthalmology and told that she didn't have eye involvement. In past day eye has become swollen and came barely open it. No blurred vision. She feels like she is having pain "around my eye, not in my eye."   Past Medical History:  Diagnosis Date  . Acne   . Anxiety    panic attacks  . Daily headache   . Heart murmur   . History of IBS   . History of kidney infection   . Hypothyroidism   . Neurocardiogenic syncope   . Neurocardiogenic syncope   . Orthopnoea   . 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 Panic attacks   . Symptomatic bradycardia   . Thyroid disease     Patient Active Problem List   Diagnosis Date Noted  . Fibromyalgia 10/16/2018  . Irritable bowel syndrome 10/16/2018  . Pacemaker battery depletion   . Paresthesia 09/01/2014  . 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 Neurocardiogenic syncope 06/23/2013  . Pacemaker - [employer] 2006 06/23/2013    Past Surgical History:  Procedure Laterality Date  . DILATION AND CURETTAGE OF UTERUS  2013  . INSERT / REPLACE / REMOVE PACEMAKER  09/2004  . PPM GENERATOR CHANGEOUT N/A 06/19/2017   Procedure: PPM GENERATOR CHANGEOUT;  Surgeon: Thurmon Fair, MD;  Location: [facility] INVASIVE CV LAB;  Service: Cardiovascular;  Laterality: N/A;  . THYROIDECTOMY  11/04/2013  . THYROIDECTOMY N/A 11/04/2013   Procedure: THYROIDECTOMY;  Surgeon: Ernestene Mention, MD;  Location: [facility] OR;  Service: General;  Laterality: N/A;     OB History    Gravida  2   Para  2   Term      Preterm      AB      Living  2     SAB      TAB      Ectopic      Multiple      Live Births               Home Medications    Prior to Admission medications   Medication Sig Start Date End Date Taking? Authorizing Provider  cyclobenzaprine (FLEXERIL) 10 MG tablet Take 1 tablet (10 mg total) by mouth 2 (two) times daily as needed for muscle spasms. 10/16/18   Hoy Register, MD  dicyclomine (BENTYL) 10 MG capsule Take 1 capsule (10 mg total) by mouth 4 (four) times daily -  before meals and at bedtime. 10/16/18   Hoy Register, MD  HYDROcodone-acetaminophen (NORCO/VICODIN) 5-325 MG tablet Take 1-2 tablets by mouth every 6 (six) hours as needed. 12/23/18   Eustace Moore, MD  levothyroxine Erline Levine, LEVOTHROID) 125 MCG tablet Take 1 tablet (125 mcg total) by mouth daily before breakfast. 09/22/18   Loletta Specter, PA-C  meloxicam (MOBIC) 7.5 MG tablet Take 1 tablet (7.5 mg total) by mouth daily. 10/16/18   Hoy Register, MD  oxyCODONE-acetaminophen (PERCOCET/ROXICET) 5-325 MG tablet Take 1 tablet by mouth  every 6 (six) hours as needed for severe pain. 12/25/18   Mardella Layman, MD  valACYclovir (VALTREX) 1000 MG tablet Take 1 tablet (1,000 mg total) by mouth 3 (three) times daily. 12/23/18   Eustace Moore, MD    Family History Family History  Problem Relation Age of Onset  . Thyroid disease Mother   . Heart disease Mother   . Thyroid disease Maternal Uncle   . Thyroid disease Maternal Grandmother   . Heart failure Maternal Grandmother   . Hypertension Maternal Grandmother   . Hypertension Maternal Grandfather     Social History Social History   Tobacco Use  . Smoking status: Current Every Day Smoker    Years: 0.50    Types: Cigars  . Smokeless tobacco: Never Used  . Tobacco comment: 11/04/2013 "smoke 3 black N milds/day"  Substance Use Topics  . Alcohol use: Yes    Comment: 11/04/2013 "mixed drinks or wine once  a month  . Drug use: No    Types: Marijuana    Comment: none for 6 mos. since 03/2014     Allergies   Banana; Other; Latex; and Penicillins   Review of Systems Review of Systems All systems reviewed and negative, other than as noted in HPI.   Physical Exam Updated Vital Signs BP (!) 125/97 (BP Location: Right Arm)   Pulse (!) 103   Temp 98.6 F (37 C) (Oral)   Resp 16   SpO2 100%   Physical Exam Vitals signs and nursing note reviewed.  Constitutional:      General: She is not in acute distress.    Appearance: She is well-developed.  HENT:     Head: Normocephalic.     Comments: Facial swelling/lesions as pictured. Eyes:     General:        Right eye: No discharge.        Left eye: No discharge.     Extraocular Movements: Extraocular movements intact.     Conjunctiva/sclera: Conjunctivae normal.     Pupils: Pupils are equal, round, and reactive to light.     Comments: Eyelid swelling as pictured. Conjunctiva clear. No focal uptake of fluorescein.   Neck:     Musculoskeletal: Neck supple.  Cardiovascular:     Rate and Rhythm: Normal rate and regular rhythm.     Heart sounds: Normal heart sounds. No murmur. No friction rub. No gallop.   Pulmonary:     Effort: Pulmonary effort is normal. No respiratory distress.     Breath sounds: Normal breath sounds.  Abdominal:     General: There is no distension.     Palpations: Abdomen is soft.     Tenderness: There is no abdominal tenderness.  Musculoskeletal:        General: No tenderness.  Skin:    General: Skin is warm and dry.  Neurological:     Mental Status: She is alert.  Psychiatric:        Behavior: Behavior normal.        Thought Content: Thought content normal.        ED Treatments / Results  Labs (all labs ordered are listed, but only abnormal results are displayed) Labs Reviewed - No data to display  EKG None  Radiology No results found.  Procedures Procedures (including critical care time)   Medications Ordered in ED Medications - No data to display   Initial Impression / Assessment and Plan / ED Course  I have reviewed the triage vital signs  and the nursing notes.  Pertinent labs & imaging results that were available during my care of the patient were reviewed by me and considered in my medical decision making (see chart for details).        33yF with shingles to face. I do not think nasociliary branch is affected. A couple lesions near birdge of nose. Nothing on tip. Eye lid swelling but conjunctiva clear. No eye pain. No visual changes. No corneal lesions noted. I do not think she needs ophthalmic steroids at this time. She says she has already been to see ophthalmology. I think it would be prudent to have re-evaluation. Return precautions to ED discussed.   Final Clinical Impressions(s) / ED Diagnoses   Final diagnoses:  Trigeminal herpes zoster    ED Discharge Orders    None       Raeford Razor, MD 12/28/18 1505

## 2018-12-31 ENCOUNTER — Other Ambulatory Visit: Payer: Self-pay

## 2018-12-31 ENCOUNTER — Ambulatory Visit (INDEPENDENT_AMBULATORY_CARE_PROVIDER_SITE_OTHER): Payer: No Typology Code available for payment source | Admitting: *Deleted

## 2018-12-31 ENCOUNTER — Telehealth: Payer: Self-pay | Admitting: Physician Assistant

## 2018-12-31 ENCOUNTER — Inpatient Hospital Stay: Payer: No Typology Code available for payment source | Admitting: Primary Care

## 2018-12-31 DIAGNOSIS — R55 Syncope and collapse: Secondary | ICD-10-CM

## 2018-12-31 LAB — CUP PACEART REMOTE DEVICE CHECK
Battery Remaining Longevity: 156 mo
Battery Remaining Percentage: 100 %
Brady Statistic RA Percent Paced: 1 %
Brady Statistic RV Percent Paced: 0 %
Date Time Interrogation Session: 20200408071400
Implantable Lead Implant Date: 20060119
Implantable Lead Implant Date: 20060119
Implantable Lead Location: 753859
Implantable Lead Location: 753860
Implantable Lead Model: 4456
Implantable Lead Model: 4469
Implantable Lead Serial Number: 439053
Implantable Lead Serial Number: 457210
Implantable Pulse Generator Implant Date: 20180926
Lead Channel Impedance Value: 382 Ohm
Lead Channel Impedance Value: 384 Ohm
Lead Channel Pacing Threshold Amplitude: 0.4 V
Lead Channel Pacing Threshold Amplitude: 0.7 V
Lead Channel Pacing Threshold Pulse Width: 0.4 ms
Lead Channel Pacing Threshold Pulse Width: 0.4 ms
Lead Channel Setting Pacing Amplitude: 2 V
Lead Channel Setting Pacing Amplitude: 2.5 V
Lead Channel Setting Pacing Pulse Width: 0.4 ms
Lead Channel Setting Sensing Sensitivity: 2.5 mV
Pulse Gen Serial Number: 789100

## 2018-12-31 MED ORDER — GABAPENTIN 300 MG PO CAPS: 300.0000 mg | ORAL_CAPSULE | Freq: Three times a day (TID) | ORAL | 0 refills | Status: DC

## 2018-12-31 MED ORDER — ACETAMINOPHEN-CODEINE #3 300-30 MG PO TABS: 1.0000 | ORAL_TABLET | Freq: Three times a day (TID) | ORAL | 0 refills | Status: DC | PRN

## 2018-12-31 NOTE — Telephone Encounter (Signed)
Per PCP please refill gabapentin and construct a letter stating patient has to be out of work until 01/04/2019. Tylenol 3 may be filled in place of percocet, being that our office does not prescribe percocet.

## 2018-12-31 NOTE — Telephone Encounter (Signed)
Contacted pt and made her aware that letter and prescriptions are ready for pickup

## 2018-12-31 NOTE — Telephone Encounter (Signed)
New Message   Pt states she is calling to check on her letter to be out of work. She says she needs to pick it because she has no fax, she is also out of her Gabapentin and percocet for pain and swelling and she uses the pharmacy at [facility] on [address].

## 2019-01-05 NOTE — ED Provider Notes (Signed)
[facility] CARE CENTER   [id number] 12/25/18 Arrival Time: 1026  ASSESSMENT & PLAN:  1. Herpes zoster with other complication     Meds ordered this encounter  Medications   oxyCODONE-acetaminophen (PERCOCET/ROXICET) 5-325 MG tablet    Sig: Take 1 tablet by mouth every 6 (six) hours as needed for severe pain.    Dispense:  15 tablet    Refill:  0   Discussed with Dr. Wynelle Link. She will see patient now. Follow-up Information    Go to  Augustin Schooling, MD.   Contact information: [address] [place] [address] [phone]         To go to Dr. Chase Caller office now.  [place] Controlled Substances Registry consulted for this patient. I feel the risk/benefit ratio today is favorable for proceeding with this prescription for a controlled substance. Medication sedation precautions given.  Will follow up with PCP or here if worsening or failing to improve as anticipated. Reviewed expectations re: course of current medical issues. Questions answered. Outlined signs and symptoms indicating need for more acute intervention. Patient verbalized understanding. After Visit Summary given.   SUBJECTIVE:  Jean Rowe is a 33 y.o. female who was recently diagnosed with facial zoster. Continued pain and swelling reported. Now eye "is swollen shut". No eye drainage or specific eye pain. Does not wear contact lenses. Therapies tried thus far: Taking Valtrex as directed and hydrocodone for pain. "Not helping the pain." Arthralgia or myalgia? none Recent illness? none Fever? none No specific aggravating or alleviating factors reported.  ROS: As per HPI. All other systems negative.  OBJECTIVE: Vitals:   12/25/18 1058  BP: (!) 125/91  Pulse: (!) 108  Resp: 18  Temp: 99.3 F (37.4 C)  TempSrc: Oral  SpO2: 96%    General appearance: alert; no distress HEENT: Rash consistent with herpes zoster or R side of face with R eyelid swelling; no gross corneal opacities noted; no limbal flush; EOMI  without pain; PERRLA; oropharynx normal without lesions Neck: supple without LAD Lungs: clear to auscultation bilaterally Heart: regular rate and rhythm Extremities: no edema Skin: as above Psychological: alert and cooperative; normal mood and affect  Allergies  Allergen Reactions   Banana Anaphylaxis    Swelling of throat   Other Other (See Comments)    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   Panic attacks    Symptomatic bradycardia    Thyroid disease    Social History   Socioeconomic History   Marital status: Single    Spouse name: Not on file   Number  of children: Not on file   Years of education: Not on file   Highest education level: Not on file  Occupational History   Not on file  Social Needs   Financial resource strain: Not on file   Food insecurity:    Worry: Not on file    Inability: Not on file   Transportation needs:    Medical: Not on file    Non-medical: Not on file  Tobacco Use   Smoking status: Current Every Day Smoker    Years: 0.50    Types: Cigars   Smokeless tobacco: Never Used    Tobacco comment: 11/04/2013 "smoke 3 black N milds/day"  Substance and Sexual Activity   Alcohol use: Yes    Comment: 11/04/2013 "mixed drinks or wine once a month   Drug use: No    Types: Marijuana    Comment: none for 6 mos. since 03/2014   Sexual activity: Yes    Birth control/protection: I.U.D.  Lifestyle   Physical activity:    Days per week: Not on file    Minutes per session: Not on file   Stress: Not on file  Relationships   Social connections:    Talks on phone: Not on file    Gets together: Not on file    Attends religious service: Not on file    Active member of club or organization: Not on file    Attends meetings of clubs or organizations: Not on file    Relationship status: Not on file   Intimate partner violence:    Fear of current or ex partner: Not on file    Emotionally abused: Not on file    Physically abused: Not on file    Forced sexual activity: Not on file  Other Topics Concern   Not on file  Social History Narrative   Epworth Sleepiness Scale = 12 (as of 10/22/2016)   Family History  Problem Relation Age of Onset   Thyroid disease Mother    Heart disease Mother    Thyroid disease Maternal Uncle    Thyroid disease Maternal Grandmother    Heart failure Maternal Grandmother    Hypertension Maternal Grandmother    Hypertension Maternal Grandfather    Past Surgical History:  Procedure Laterality Date   DILATION AND CURETTAGE OF UTERUS  2013   INSERT / REPLACE / REMOVE PACEMAKER  09/2004   PPM GENERATOR CHANGEOUT N/A 06/19/2017   Procedure: PPM GENERATOR CHANGEOUT;  Surgeon: Thurmon Fairroitoru, Mihai, MD;  Location: [facility] INVASIVE CV LAB;  Service: Cardiovascular;  Laterality: N/A;   THYROIDECTOMY  11/04/2013   THYROIDECTOMY N/A 11/04/2013   Procedure: THYROIDECTOMY;  Surgeon: Ernestene MentionHaywood M Ingram, MD;  Location: [facility] OR;  Service: General;  Laterality: Vertis KelchN/A;     Vihaan Gloss, MD 01/05/19 [phone]

## 2019-01-07 ENCOUNTER — Other Ambulatory Visit: Payer: Self-pay | Admitting: Primary Care

## 2019-01-07 ENCOUNTER — Telehealth: Payer: Self-pay | Admitting: Physician Assistant

## 2019-01-07 MED ORDER — GABAPENTIN 300 MG PO CAPS: 300.0000 mg | ORAL_CAPSULE | Freq: Three times a day (TID) | ORAL | 0 refills | Status: DC

## 2019-01-07 NOTE — Telephone Encounter (Signed)
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 I will send  in gabapentin

## 2019-01-07 NOTE — Telephone Encounter (Signed)
1) Medication(s) Requested (by name): oxyCODONE-acetaminophen  gabapentin (NEURONTIN) 300 MG capsule    2) Pharmacy of Choice: [id number] [place] [address] [place] - [address])   Patient was told by the pharmacy that they no longer take paper prescriptions and was needing prescription sent over electronically Please follow up

## 2019-01-07 NOTE — Telephone Encounter (Signed)
Please address this concern

## 2019-01-09 NOTE — Telephone Encounter (Signed)
Patient verified DOB Patient is aware of gabapentin being refilled and request to PCP for tylenol 3 is being sent for e-scribe also.Jean Rowe

## 2019-01-09 NOTE — Progress Notes (Signed)
Remote pacemaker transmission.   

## 2019-01-11 NOTE — Telephone Encounter (Signed)
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

## 2019-01-12 ENCOUNTER — Other Ambulatory Visit: Payer: Self-pay | Admitting: Primary Care

## 2019-01-12 DIAGNOSIS — B0222 Postherpetic trigeminal neuralgia: Secondary | ICD-10-CM

## 2019-01-12 MED ORDER — ACETAMINOPHEN-CODEINE #3 300-30 MG PO TABS: 1.0000 | ORAL_TABLET | Freq: Three times a day (TID) | ORAL | 0 refills | Status: DC | PRN

## 2019-01-13 ENCOUNTER — Other Ambulatory Visit: Payer: Self-pay | Admitting: Primary Care

## 2019-01-13 DIAGNOSIS — B0222 Postherpetic trigeminal neuralgia: Secondary | ICD-10-CM

## 2019-01-13 MED ORDER — ACETAMINOPHEN-CODEINE #3 300-30 MG PO TABS: 1.0000 | ORAL_TABLET | Freq: Three times a day (TID) | ORAL | 0 refills | Status: DC | PRN

## 2019-02-15 ENCOUNTER — Other Ambulatory Visit (INDEPENDENT_AMBULATORY_CARE_PROVIDER_SITE_OTHER): Payer: Self-pay | Admitting: Family Medicine

## 2019-02-15 ENCOUNTER — Other Ambulatory Visit (INDEPENDENT_AMBULATORY_CARE_PROVIDER_SITE_OTHER): Payer: Self-pay | Admitting: Primary Care

## 2019-02-15 DIAGNOSIS — B0222 Postherpetic trigeminal neuralgia: Secondary | ICD-10-CM

## 2019-02-15 DIAGNOSIS — M797 Fibromyalgia: Secondary | ICD-10-CM

## 2019-02-17 NOTE — Telephone Encounter (Signed)
She was tx for shingles I have refilled this prior she needs gabapentin not narcotics

## 2019-04-01 ENCOUNTER — Ambulatory Visit (INDEPENDENT_AMBULATORY_CARE_PROVIDER_SITE_OTHER): Payer: No Typology Code available for payment source | Admitting: *Deleted

## 2019-04-01 DIAGNOSIS — Z950 Presence of cardiac pacemaker: Secondary | ICD-10-CM

## 2019-04-01 DIAGNOSIS — R55 Syncope and collapse: Secondary | ICD-10-CM

## 2019-04-01 LAB — CUP PACEART REMOTE DEVICE CHECK
Battery Remaining Longevity: 168 mo
Battery Remaining Percentage: 100 %
Brady Statistic RA Percent Paced: 1 %
Brady Statistic RV Percent Paced: 0 %
Date Time Interrogation Session: 20200708070100
Implantable Lead Implant Date: 20060119
Implantable Lead Implant Date: 20060119
Implantable Lead Location: 753859
Implantable Lead Location: 753860
Implantable Lead Model: 4456
Implantable Lead Model: 4469
Implantable Lead Serial Number: 439053
Implantable Lead Serial Number: 457210
Implantable Pulse Generator Implant Date: 20180926
Lead Channel Impedance Value: 396 Ohm
Lead Channel Impedance Value: 410 Ohm
Lead Channel Pacing Threshold Amplitude: 0.4 V
Lead Channel Pacing Threshold Amplitude: 0.8 V
Lead Channel Pacing Threshold Pulse Width: 0.4 ms
Lead Channel Pacing Threshold Pulse Width: 0.4 ms
Lead Channel Setting Pacing Amplitude: 2 V
Lead Channel Setting Pacing Amplitude: 2.5 V
Lead Channel Setting Pacing Pulse Width: 0.4 ms
Lead Channel Setting Sensing Sensitivity: 2.5 mV
Pulse Gen Serial Number: 789100

## 2019-04-13 ENCOUNTER — Encounter: Payer: Self-pay | Admitting: Cardiology

## 2019-04-13 NOTE — Progress Notes (Signed)
Remote pacemaker transmission.   

## 2019-04-18 ENCOUNTER — Other Ambulatory Visit (INDEPENDENT_AMBULATORY_CARE_PROVIDER_SITE_OTHER): Payer: Self-pay | Admitting: Family Medicine

## 2019-04-18 DIAGNOSIS — M797 Fibromyalgia: Secondary | ICD-10-CM

## 2019-04-27 ENCOUNTER — Other Ambulatory Visit: Payer: Self-pay

## 2019-04-27 ENCOUNTER — Ambulatory Visit (INDEPENDENT_AMBULATORY_CARE_PROVIDER_SITE_OTHER): Payer: Self-pay | Admitting: Primary Care

## 2019-04-27 VITALS — BP 138/88 | HR 88 | Ht 61.0 in | Wt 177.4 lb

## 2019-04-27 DIAGNOSIS — E890 Postprocedural hypothyroidism: Secondary | ICD-10-CM

## 2019-04-27 DIAGNOSIS — G43109 Migraine with aura, not intractable, without status migrainosus: Secondary | ICD-10-CM

## 2019-04-27 DIAGNOSIS — R5383 Other fatigue: Secondary | ICD-10-CM

## 2019-04-27 DIAGNOSIS — K580 Irritable bowel syndrome with diarrhea: Secondary | ICD-10-CM

## 2019-04-27 DIAGNOSIS — B0222 Postherpetic trigeminal neuralgia: Secondary | ICD-10-CM

## 2019-04-27 DIAGNOSIS — H6121 Impacted cerumen, right ear: Secondary | ICD-10-CM

## 2019-04-27 DIAGNOSIS — K582 Mixed irritable bowel syndrome: Secondary | ICD-10-CM

## 2019-04-27 DIAGNOSIS — M797 Fibromyalgia: Secondary | ICD-10-CM

## 2019-04-27 MED ORDER — CYCLOBENZAPRINE HCL 10 MG PO TABS: ORAL_TABLET | ORAL | 2 refills | Status: DC

## 2019-04-27 MED ORDER — DICYCLOMINE HCL 10 MG PO CAPS: 10.0000 mg | ORAL_CAPSULE | Freq: Three times a day (TID) | ORAL | 1 refills | Status: DC

## 2019-04-27 MED ORDER — SUMATRIPTAN SUCCINATE 50 MG PO TABS: 50.0000 mg | ORAL_TABLET | ORAL | 0 refills | Status: DC | PRN

## 2019-04-27 NOTE — Progress Notes (Signed)
Established Patient Office Visit  Subjective:  Patient ID: Jean Rowe, female    DOB: May 03, 1986  Age: 33 y.o. MRN: [id number]  CC:  Chief Complaint  Patient presents with  . Hypothyroidism  . Medication Refill    HPI NAKYLA BRACCO presents for refills on thyroid medications and has been out for 2 weeks and the last labs were one 09/19/2018. She also, requesting flexeril for her fibromyalgia which she feels helps.  Past Medical History:  Diagnosis Date  . Acne   . Anxiety    panic attacks  . Daily headache   . Heart murmur   . History of IBS   . History of kidney infection   . Hypothyroidism   . Neurocardiogenic syncope   . Neurocardiogenic syncope   . Orthopnoea   . 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 Panic attacks   . Symptomatic bradycardia   . Thyroid disease     Past Surgical History:  Procedure Laterality Date  . DILATION AND CURETTAGE OF UTERUS  2013  . INSERT / REPLACE / REMOVE PACEMAKER  09/2004  . PPM GENERATOR CHANGEOUT N/A 06/19/2017   Procedure: PPM GENERATOR CHANGEOUT;  Surgeon: Sanda Klein, MD;  Location: [facility];  Service: Cardiovascular;  Laterality: N/A;  . THYROIDECTOMY  11/04/2013  . THYROIDECTOMY N/A 11/04/2013   Procedure: THYROIDECTOMY;  Surgeon: Adin Hector, MD;  Location: [facility];  Service: General;  Laterality: N/A;    Family History  Problem Relation Age of Onset  . Thyroid disease Mother   . Heart disease Mother   . Thyroid disease Maternal Uncle   . Thyroid disease Maternal Grandmother   . Heart failure Maternal Grandmother   . Hypertension Maternal Grandmother   . Hypertension Maternal Grandfather     Social History   Socioeconomic History  . Marital status: Single    Spouse name: Not on file  . Number of children: Not on file  . Years of education: Not on file  . Highest education level: Not on file  Occupational History  . Not on file  Social Needs  . Financial resource strain:  Not on file  . Food insecurity    Worry: Not on file    Inability: Not on file  . Transportation needs    Medical: Not on file    Non-medical: Not on file  Tobacco Use  . Smoking status: Current Every Day Smoker    Years: 0.50    Types: Cigars  . Smokeless tobacco: Never Used  . Tobacco comment: 11/04/2013 "smoke 3 black N milds/day"  Substance and Sexual Activity  . Alcohol use: Yes    Comment: 11/04/2013 "mixed drinks or wine once a month  . Drug use: No    Types: Marijuana    Comment: none for 6 mos. since 03/2014  . Sexual activity: Yes    Birth control/protection: I.U.D.  Lifestyle  . Physical activity    Days per week: Not on file    Minutes per session: Not on file  . Stress: Not on file  Relationships  . Social Herbalist on phone: Not on file    Gets together: Not on file    Attends religious service: Not on file    Active member of club or organization: Not on file    Attends meetings of clubs or organizations: Not on file    Relationship status: Not on file  . Intimate partner violence  Fear of current or ex partner: Not on file    Emotionally abused: Not on file    Physically abused: Not on file    Forced sexual activity: Not on file  Other Topics Concern  . Not on file  Social History Narrative   Epworth Sleepiness Scale = 12 (as of 10/22/2016)    Outpatient Medications Prior to Visit  Medication Sig Dispense Refill  . acetaminophen-codeine (TYLENOL #3) 300-30 MG tablet Take 1 tablet by mouth 3 (three) times daily as needed for moderate pain. 30 tablet 0  . levothyroxine (SYNTHROID, LEVOTHROID) 125 MCG tablet Take 1 tablet (125 mcg total) by mouth daily before breakfast. 90 tablet 1  . meloxicam (MOBIC) 7.5 MG tablet Take 1 tablet (7.5 mg total) by mouth daily. 30 tablet 1  . cyclobenzaprine (FLEXERIL) 10 MG tablet TAKE 1 TABLET BY MOUTH TWICE DAILY AS NEEDED FOR MUSCLE SPASM 60 tablet 0  . gabapentin (NEURONTIN) 300 MG capsule Take 1 capsule  (300 mg total) by mouth 3 (three) times daily. (Patient not taking: Reported on 04/27/2019) 90 capsule 0  . HYDROcodone-acetaminophen (NORCO/VICODIN) 5-325 MG tablet Take 1-2 tablets by mouth every 6 (six) hours as needed. (Patient not taking: Reported on 04/27/2019) 12 tablet 0  . oxyCODONE-acetaminophen (PERCOCET/ROXICET) 5-325 MG tablet Take 1 tablet by mouth every 6 (six) hours as needed for severe pain. (Patient not taking: Reported on 04/27/2019) 15 tablet 0  . predniSONE (DELTASONE) 20 MG tablet Take 2 tablets (40 mg total) by mouth daily. (Patient not taking: Reported on 04/27/2019) 10 tablet 0  . valACYclovir (VALTREX) 1000 MG tablet Take 1 tablet (1,000 mg total) by mouth 3 (three) times daily. (Patient not taking: Reported on 04/27/2019) 21 tablet 0  . dicyclomine (BENTYL) 10 MG capsule Take 1 capsule (10 mg total) by mouth 4 (four) times daily -  before meals and at bedtime. (Patient not taking: Reported on 04/27/2019) 90 capsule 1   No facility-administered medications prior to visit.     Allergies  Allergen Reactions  . Banana Anaphylaxis    Swelling of throat  . 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 Latex Rash  . Penicillins Other (See Comments)    Has patient had a PCN reaction causing immediate rash, facial/tongue/throat swelling, SOB or lightheadedness with hypotension: unknown Has patient had a PCN reaction causing severe rash involving mucus membranes or skin necrosis:unknownHas patient had a PCN reaction that required hospitalization unknown Has patient had a PCN reaction occurring within the last 10 years: unknown If all of the above answers are "NO", then may proceed with Cephalosporin use.     ROS Review of Systems  Constitutional: Positive for activity change, fatigue and unexpected weight change.       Gain   HENT: Positive for ear discharge.   Respiratory: Positive for shortness of breath.         Excertion  Neurological: Positive for light-headedness and headaches.      Objective:    Physical Exam  Constitutional: She is oriented to person, place, and time. She appears well-developed and well-nourished.  HENT:  Head: Atraumatic. Head is with right periorbital erythema.  Right Ear: Hearing normal.  Left Ear: Hearing and ear canal normal. There is drainage.  Eyes: Pupils are equal, round, and reactive to light. EOM are normal.  Neck: Normal range of motion. Neck supple.  Cardiovascular: Normal rate and regular rhythm.  Pulmonary/Chest:  Effort normal and breath sounds normal.  Abdominal: Soft. Bowel sounds are normal. She exhibits distension.  Musculoskeletal: Normal range of motion.  Neurological: She is alert and oriented to person, place, and time.  Skin: Skin is warm and dry.  Psychiatric: She has a normal mood and affect.    BP 138/88   Pulse 88   Ht 5' 1"  (1.549 m)   Wt 177 lb 6.4 oz (80.5 kg)   SpO2 98%   BMI 33.52 kg/m  Wt Readings from Last 3 Encounters:  04/27/19 177 lb 6.4 oz (80.5 kg)  10/16/18 153 lb 3.2 oz (69.5 kg)  10/15/18 153 lb 6.4 oz (69.6 kg)     Health Maintenance Due  Topic Date Due  . PAP SMEAR-Modifier  12/15/2006  . INFLUENZA VACCINE  04/25/2019    There are no preventive care reminders to display for this patient.  Lab Results  Component Value Date   TSH 3.070 04/27/2019   Lab Results  Component Value Date   WBC 8.8 04/27/2019   HGB 13.3 04/27/2019   HCT 39.5 04/27/2019   MCV 96 04/27/2019   PLT 336 04/27/2019   Lab Results  Component Value Date   NA 139 04/27/2019   K 4.6 04/27/2019   CO2 22 04/27/2019   GLUCOSE 94 04/27/2019   BUN 10 04/27/2019   CREATININE 0.75 04/27/2019   BILITOT 0.3 04/27/2019   ALKPHOS 86 04/27/2019   AST 18 04/27/2019   ALT 7 04/27/2019   PROT 6.7 04/27/2019   ALBUMIN 4.2 04/27/2019   CALCIUM 8.9 04/27/2019   ANIONGAP 6 02/23/2017   GFR 101.94 08/06/2016   No results found for:  CHOL No results found for: HDL No results found for: LDLCALC No results found for: TRIG No results found for: CHOLHDL No results found for: HGBA1C    Assessment & Plan:   Problem List Items Addressed This Visit    Postsurgical hypothyroidism   Relevant Orders   CMP14+EGFR (Completed)   TSH + free T4 (Completed)   Fibromyalgia   Relevant Medications   cyclobenzaprine (FLEXERIL) 10 MG tablet   Irritable bowel syndrome   Relevant Medications   dicyclomine (BENTYL) 10 MG capsule    Other Visit Diagnoses    Trigeminal herpes zoster    -  Primary   Relevant Medications   cyclobenzaprine (FLEXERIL) 10 MG tablet   Fatigue, unspecified type       Relevant Orders   CBC with Differential (Completed)   CMP14+EGFR (Completed)   Migraine with aura and without status migrainosus, not intractable       Relevant Medications   cyclobenzaprine (FLEXERIL) 10 MG tablet   SUMAtriptan (IMITREX) 50 MG tablet   Right ear impacted cerumen       Relevant Orders   Ear Lavage    Kailany was seen today for hypothyroidism and medication refill.  Diagnoses and all orders for this visit:  Trigeminal herpes zoster Scaring remains over trigeminal healed   Irritable bowel syndrome with both constipation and diarrhea MANAGEMENT OF CHRONIC CONSTIPATION   Drink fluids in the recommended amount everyday. Recommend amount is 8 cups of water daily. Do not replace water with Gatorade or Powerade as these should only be used when you are dehydrated.   Eat lots of high fiber foods-fruits, veggies, bran and whole grain instead of white bread  Be active everyday. Inactivity makes constipation worse.  Add psyllium daily (Metamucil) which comes in capsules now. Start very low dose and work up  to recommended dose on bottle daily.  Stay away from [place] or any magnesium containing laxative, unless you need it to clear things out rarely. It is an addictive laxative and your gut will become dependent  on it.  If that is not working, I would start Miralax, which you can buy in generic 17 gms daily. It's a powder and not an "addictive laxative". Take it every day and titrate the dose up or down to get the daily Bm.  We will consider the use of other pharmacological treatments should the above recommendations prove to be unsuccessful.  Irritable bowel syndrome with diarrhea -     dicyclomine (BENTYL) 10 MG capsule; Take 1 capsule (10 mg total) by mouth 4 (four) times daily -  before meals and at bedtime.  Fatigue, unspecified type -Pace yourself -Plan your day -Include naps and breaks -schedule a relaxing day -get a little exercise -fuel the body -consider complementary therapies   -deep breathing   -prayer/medication   -guided meditation -     CBC with Differential -     CMP14+EGFR  Postsurgical hypothyroidism -     CMP14+EGFR -     TSH + free T4  Fibromyalgia -     cyclobenzaprine (FLEXERIL) 10 MG tablet; TAKE 1 TABLET BY MOUTH TWICE DAILY AS NEEDED FOR MUSCLE SPASM  Migraine with aura and without status migrainosus, not intractable Signs can be individualized with throbbing pain on any area of the head.  They may come with an aura-dizziness, nausea, sensitive to light sound or smell.  Triggers can be caused by drinking alcohol smoking or certain medications, eating and drinking certain products  This condition may be triggered or caused by: Caffeine, aged cheese, and chocolate.  Right ear impacted cerumen -     Ear Lavage  Other orders -     SUMAtriptan (IMITREX) 50 MG tablet; Take 1 tablet (50 mg total) by mouth every 2 (two) hours as needed for migraine. May repeat in 2 hours if headache persists or recurs.    Meds ordered this encounter  Medications  . DISCONTD: dicyclomine (BENTYL) 10 MG capsule    Sig: Take 1 capsule (10 mg total) by mouth 4 (four) times daily -  before meals and at bedtime.    Dispense:  90 capsule    Refill:  1  . DISCONTD: cyclobenzaprine  (FLEXERIL) 10 MG tablet    Sig: TAKE 1 TABLET BY MOUTH TWICE DAILY AS NEEDED FOR MUSCLE SPASM    Dispense:  60 tablet    Refill:  2  . dicyclomine (BENTYL) 10 MG capsule    Sig: Take 1 capsule (10 mg total) by mouth 4 (four) times daily -  before meals and at bedtime.    Dispense:  90 capsule    Refill:  1  . cyclobenzaprine (FLEXERIL) 10 MG tablet    Sig: TAKE 1 TABLET BY MOUTH TWICE DAILY AS NEEDED FOR MUSCLE SPASM    Dispense:  60 tablet    Refill:  2  . SUMAtriptan (IMITREX) 50 MG tablet    Sig: Take 1 tablet (50 mg total) by mouth every 2 (two) hours as needed for migraine. May repeat in 2 hours if headache persists or recurs.    Dispense:  10 tablet    Refill:  0    Follow-up: No follow-ups on file.    Kerin Perna, NP

## 2019-04-27 NOTE — Progress Notes (Signed)
Patient needs thyroid labs done.  Patient needs refill on medications  Patient states that she had shingles in April and has been having headaches ever since.

## 2019-04-28 ENCOUNTER — Other Ambulatory Visit (INDEPENDENT_AMBULATORY_CARE_PROVIDER_SITE_OTHER): Payer: Self-pay | Admitting: Primary Care

## 2019-04-28 ENCOUNTER — Encounter (INDEPENDENT_AMBULATORY_CARE_PROVIDER_SITE_OTHER): Payer: Self-pay | Admitting: Primary Care

## 2019-04-28 LAB — CBC WITH DIFFERENTIAL/PLATELET
Basophils Absolute: 0 10*3/uL (ref 0.0–0.2)
Basos: 0 %
EOS (ABSOLUTE): 0.2 10*3/uL (ref 0.0–0.4)
Eos: 2 %
Hematocrit: 39.5 % (ref 34.0–46.6)
Hemoglobin: 13.3 g/dL (ref 11.1–15.9)
Immature Grans (Abs): 0 10*3/uL (ref 0.0–0.1)
Immature Granulocytes: 0 %
Lymphocytes Absolute: 2.3 10*3/uL (ref 0.7–3.1)
Lymphs: 26 %
MCH: 32.2 pg (ref 26.6–33.0)
MCHC: 33.7 g/dL (ref 31.5–35.7)
MCV: 96 fL (ref 79–97)
Monocytes Absolute: 0.5 10*3/uL (ref 0.1–0.9)
Monocytes: 6 %
Neutrophils Absolute: 5.8 10*3/uL (ref 1.4–7.0)
Neutrophils: 66 %
Platelets: 336 10*3/uL (ref 150–450)
RBC: 4.13 x10E6/uL (ref 3.77–5.28)
RDW: 12 % (ref 11.7–15.4)
WBC: 8.8 10*3/uL (ref 3.4–10.8)

## 2019-04-28 LAB — CMP14+EGFR
ALT: 7 IU/L (ref 0–32)
AST: 18 IU/L (ref 0–40)
Albumin/Globulin Ratio: 1.7 (ref 1.2–2.2)
Albumin: 4.2 g/dL (ref 3.8–4.8)
Alkaline Phosphatase: 86 IU/L (ref 39–117)
BUN/Creatinine Ratio: 13 (ref 9–23)
BUN: 10 mg/dL (ref 6–20)
Bilirubin Total: 0.3 mg/dL (ref 0.0–1.2)
CO2: 22 mmol/L (ref 20–29)
Calcium: 8.9 mg/dL (ref 8.7–10.2)
Chloride: 104 mmol/L (ref 96–106)
Creatinine, Ser: 0.75 mg/dL (ref 0.57–1.00)
GFR calc Af Amer: 121 mL/min/{1.73_m2} (ref >59)
GFR calc non Af Amer: 105 mL/min/{1.73_m2} (ref >59)
Globulin, Total: 2.5 g/dL (ref 1.5–4.5)
Glucose: 94 mg/dL (ref 65–99)
Potassium: 4.6 mmol/L (ref 3.5–5.2)
Sodium: 139 mmol/L (ref 134–144)
Total Protein: 6.7 g/dL (ref 6.0–8.5)

## 2019-04-28 LAB — TSH+FREE T4
Free T4: 1.06 ng/dL (ref 0.82–1.77)
TSH: 3.07 u[IU]/mL (ref 0.450–4.500)

## 2019-04-28 MED ORDER — LEVOTHYROXINE SODIUM 125 MCG PO TABS: 125.0000 ug | ORAL_TABLET | Freq: Every day | ORAL | 1 refills | Status: DC

## 2019-04-28 MED FILL — CYCLOBENZAPRINE 10 MG TAB: 10 | 30 days supply | Qty: 60 | Fill #0

## 2019-04-28 MED FILL — SUMATRIPTAN SUCC 50 MG TAB: 50 | 30 days supply | Qty: 10 | Fill #0

## 2019-04-28 MED FILL — DICYCLOMINE 10 MG CAPSULE: 10 | 22 days supply | Qty: 90 | Fill #0

## 2019-04-29 ENCOUNTER — Telehealth (INDEPENDENT_AMBULATORY_CARE_PROVIDER_SITE_OTHER): Payer: Self-pay

## 2019-04-29 NOTE — Telephone Encounter (Signed)
-----   Message from Kerin Perna, NP sent at 04/28/2019  2:15 PM EDT ----- All labs are normal will refill thyroid medication at current dose

## 2019-04-29 NOTE — Telephone Encounter (Signed)
Patient is aware that all labs are normal and thyroid medication has been refilled at current dose. Nat Christen, CMA

## 2019-06-19 ENCOUNTER — Other Ambulatory Visit (INDEPENDENT_AMBULATORY_CARE_PROVIDER_SITE_OTHER): Payer: Self-pay | Admitting: Primary Care

## 2019-06-19 MED FILL — CYCLOBENZAPRINE 10 MG TAB: 10 | 30 days supply | Qty: 60 | Fill #1

## 2019-06-19 NOTE — Telephone Encounter (Signed)
FWD to PCP. Jerrol Helmers S Helios Kohlmann, CMA  

## 2019-06-22 MED FILL — SUMATRIPTAN SUCC 50 MG TAB: 50 | 30 days supply | Qty: 10 | Fill #0

## 2019-07-01 ENCOUNTER — Ambulatory Visit (INDEPENDENT_AMBULATORY_CARE_PROVIDER_SITE_OTHER): Payer: No Typology Code available for payment source | Admitting: *Deleted

## 2019-07-01 DIAGNOSIS — R55 Syncope and collapse: Secondary | ICD-10-CM

## 2019-07-02 LAB — CUP PACEART REMOTE DEVICE CHECK
Battery Remaining Longevity: 168 mo
Battery Remaining Percentage: 100 %
Brady Statistic RA Percent Paced: 1 %
Brady Statistic RV Percent Paced: 0 %
Date Time Interrogation Session: 20201007070200
Implantable Lead Implant Date: 20060119
Implantable Lead Implant Date: 20060119
Implantable Lead Location: 753859
Implantable Lead Location: 753860
Implantable Lead Model: 4456
Implantable Lead Model: 4469
Implantable Lead Serial Number: 439053
Implantable Lead Serial Number: 457210
Implantable Pulse Generator Implant Date: 20180926
Lead Channel Impedance Value: 404 Ohm
Lead Channel Impedance Value: 439 Ohm
Lead Channel Pacing Threshold Amplitude: 0.6 V
Lead Channel Pacing Threshold Pulse Width: 0.4 ms
Lead Channel Setting Pacing Amplitude: 2 V
Lead Channel Setting Pacing Amplitude: 2.5 V
Lead Channel Setting Pacing Pulse Width: 0.4 ms
Lead Channel Setting Sensing Sensitivity: 2.5 mV
Pulse Gen Serial Number: 789100

## 2019-07-09 ENCOUNTER — Encounter (INDEPENDENT_AMBULATORY_CARE_PROVIDER_SITE_OTHER): Payer: Self-pay | Admitting: Primary Care

## 2019-07-09 ENCOUNTER — Telehealth (INDEPENDENT_AMBULATORY_CARE_PROVIDER_SITE_OTHER): Payer: No Typology Code available for payment source | Admitting: Primary Care

## 2019-07-09 DIAGNOSIS — L700 Acne vulgaris: Secondary | ICD-10-CM

## 2019-07-09 MED ORDER — TETRACYCLINE HCL 500 MG PO CAPS: 500.0000 mg | ORAL_CAPSULE | Freq: Two times a day (BID) | ORAL | 3 refills | Status: DC

## 2019-07-09 MED ORDER — FLUCONAZOLE 150 MG PO TABS: 150.0000 mg | ORAL_TABLET | Freq: Once | ORAL | 0 refills | Status: AC

## 2019-07-09 NOTE — Progress Notes (Signed)
Virtual Visit via Telephone Note  I connected with Juanito Doom on 07/09/19 at  3:50 PM EDT by telephone and verified that I am speaking with the correct person using two identifiers.   I discussed the limitations, risks, security and privacy concerns of performing an evaluation and management service by telephone and the availability of in person appointments. I also discussed with the patient that there may be a patient responsible charge related to this service. The patient expressed understanding and agreed to proceed.  WEB visit  History of Present Illness: Ms. ELIESE KERWOOD is having a web visit she is concern about being 33 year old for her acne she does have comedome acne. She voices no other complaints or concerns Past Medical History:  Diagnosis Date  . Acne   . Anxiety    panic attacks  . Daily headache   . Heart murmur   . History of IBS   . History of kidney infection   . Hypothyroidism   . Neurocardiogenic syncope   . Neurocardiogenic syncope   . Orthopnoea   . 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 Panic attacks   . Symptomatic bradycardia   . Thyroid disease      Observations/Objective: Review of Systems  Skin:       Comedone acne  All other systems reviewed and are negative.   Assessment and Plan: Tamsyn was seen today for acne and insomnia.  Diagnoses and all orders for this visit:  Acne comedone Patient has tried benzoyl , salicylic acid and other OTC treatment for her acne. She would benefit from a dermatology consult. Prescribe antibiotic and expressed DO NOT POP THE BUMP- "but it is ugly" Keep oil and hair off her face .  Other orders -     tetracycline (SUMYCIN) 500 MG capsule; Take 1 capsule (500 mg total) by mouth 2 (two) times daily. -     fluconazole (DIFLUCAN) 150 MG tablet; Take 1 tablet (150 mg total) by mouth once for 1 dose.    Follow Up Instructions:    I discussed the assessment and treatment plan with the  patient. The patient was provided an opportunity to ask questions and all were answered. The patient agreed with the plan and demonstrated an understanding of the instructions.   The patient was advised to call back or seek an in-person evaluation if the symptoms worsen or if the condition fails to improve as anticipated.  I provided 13 minutes of non-face-to-face time during this encounter.   Kerin Perna, NP

## 2019-07-10 MED FILL — FLUCONAZOLE 150 MG TABLET: 150 | 1 days supply | Qty: 1 | Fill #0

## 2019-07-10 NOTE — Progress Notes (Signed)
Remote pacemaker transmission.   

## 2019-07-14 ENCOUNTER — Other Ambulatory Visit (INDEPENDENT_AMBULATORY_CARE_PROVIDER_SITE_OTHER): Payer: Self-pay | Admitting: Primary Care

## 2019-07-14 DIAGNOSIS — L700 Acne vulgaris: Secondary | ICD-10-CM

## 2019-07-14 MED ORDER — CLINDAMYCIN PHOSPHATE 1 % EX GEL: Freq: Two times a day (BID) | CUTANEOUS | 0 refills | Status: DC

## 2019-07-14 MED FILL — CLINDAMYCIN PH 1% GEL: 1 | 15 days supply | Qty: 30 | Fill #0

## 2019-08-13 MED FILL — CYCLOBENZAPRINE 10 MG TAB: 10 | 30 days supply | Qty: 60 | Fill #2

## 2019-09-30 ENCOUNTER — Ambulatory Visit (INDEPENDENT_AMBULATORY_CARE_PROVIDER_SITE_OTHER): Payer: No Typology Code available for payment source | Admitting: *Deleted

## 2019-09-30 DIAGNOSIS — R55 Syncope and collapse: Secondary | ICD-10-CM

## 2019-10-01 LAB — CUP PACEART REMOTE DEVICE CHECK
Battery Remaining Longevity: 162 mo
Battery Remaining Percentage: 100 %
Brady Statistic RA Percent Paced: 0 %
Brady Statistic RV Percent Paced: 0 %
Date Time Interrogation Session: 20210106030200
Implantable Lead Implant Date: 20060119
Implantable Lead Implant Date: 20060119
Implantable Lead Location: 753859
Implantable Lead Location: 753860
Implantable Lead Model: 4456
Implantable Lead Model: 4469
Implantable Lead Serial Number: 439053
Implantable Lead Serial Number: 457210
Implantable Pulse Generator Implant Date: 20180926
Lead Channel Impedance Value: 419 Ohm
Lead Channel Impedance Value: 439 Ohm
Lead Channel Pacing Threshold Amplitude: 0.4 V
Lead Channel Pacing Threshold Amplitude: 0.7 V
Lead Channel Pacing Threshold Pulse Width: 0.4 ms
Lead Channel Pacing Threshold Pulse Width: 0.4 ms
Lead Channel Setting Pacing Amplitude: 2 V
Lead Channel Setting Pacing Amplitude: 2.5 V
Lead Channel Setting Pacing Pulse Width: 0.4 ms
Lead Channel Setting Sensing Sensitivity: 2.5 mV
Pulse Gen Serial Number: 789100

## 2019-10-08 ENCOUNTER — Other Ambulatory Visit (INDEPENDENT_AMBULATORY_CARE_PROVIDER_SITE_OTHER): Payer: Self-pay | Admitting: Primary Care

## 2019-10-08 DIAGNOSIS — M797 Fibromyalgia: Secondary | ICD-10-CM

## 2019-10-08 MED FILL — CYCLOBENZAPRINE 10 MG TAB: 10 | 30 days supply | Qty: 60 | Fill #0

## 2019-10-13 ENCOUNTER — Ambulatory Visit (INDEPENDENT_AMBULATORY_CARE_PROVIDER_SITE_OTHER): Payer: Self-pay | Admitting: Primary Care

## 2019-10-13 ENCOUNTER — Other Ambulatory Visit (HOSPITAL_COMMUNITY): Admission: RE | Admit: 2019-10-13 | Payer: Medicaid Other | Source: Ambulatory Visit

## 2019-10-13 ENCOUNTER — Telehealth: Payer: Self-pay

## 2019-10-13 ENCOUNTER — Other Ambulatory Visit: Payer: Self-pay

## 2019-10-13 ENCOUNTER — Other Ambulatory Visit (HOSPITAL_COMMUNITY)
Admission: RE | Admit: 2019-10-13 | Discharge: 2019-10-13 | Disposition: A | Discharge status: Home / Self Care | Payer: Medicaid Other | Source: Ambulatory Visit | Attending: Primary Care | Admitting: Primary Care

## 2019-10-13 ENCOUNTER — Encounter (INDEPENDENT_AMBULATORY_CARE_PROVIDER_SITE_OTHER): Payer: Self-pay | Admitting: Primary Care

## 2019-10-13 VITALS — BP 131/94 | HR 103 | Temp 97.3°F | Resp 16 | Wt 181.2 lb

## 2019-10-13 DIAGNOSIS — N898 Other specified noninflammatory disorders of vagina: Secondary | ICD-10-CM

## 2019-10-13 DIAGNOSIS — E042 Nontoxic multinodular goiter: Secondary | ICD-10-CM

## 2019-10-13 DIAGNOSIS — Z113 Encounter for screening for infections with a predominantly sexual mode of transmission: Secondary | ICD-10-CM | POA: Diagnosis present

## 2019-10-13 DIAGNOSIS — Z124 Encounter for screening for malignant neoplasm of cervix: Secondary | ICD-10-CM | POA: Insufficient documentation

## 2019-10-13 DIAGNOSIS — R030 Elevated blood-pressure reading, without diagnosis of hypertension: Secondary | ICD-10-CM

## 2019-10-13 DIAGNOSIS — Z0000 Encounter for general adult medical examination without abnormal findings: Secondary | ICD-10-CM

## 2019-10-13 DIAGNOSIS — N912 Amenorrhea, unspecified: Secondary | ICD-10-CM

## 2019-10-13 NOTE — Telephone Encounter (Signed)
LMOVM for pt to stop sending manual transmissions. I left my direct office number in case the pt has any questions.

## 2019-10-13 NOTE — Progress Notes (Signed)
Established Patient Office Visit  Subjective:  Patient ID: Jean Rowe, female    DOB: 1986-04-17  Age: 34 y.o. MRN: [id number]  CC:  Chief Complaint  Patient presents with  . Gynecologic Exam  . Medication Refill    HPI Jean Rowe presents for female wellness exam. She is concerned about an vaginal discharge and bumps in her genital area.  Past Medical History:  Diagnosis Date  . Acne   . Anxiety    panic attacks  . Daily headache   . Heart murmur   . History of IBS   . History of kidney infection   . Hypothyroidism   . Neurocardiogenic syncope   . Neurocardiogenic syncope   . Orthopnoea   . 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 Panic attacks   . Symptomatic bradycardia   . Thyroid disease     Past Surgical History:  Procedure Laterality Date  . DILATION AND CURETTAGE OF UTERUS  2013  . INSERT / REPLACE / REMOVE PACEMAKER  09/2004  . PPM GENERATOR CHANGEOUT N/A 06/19/2017   Procedure: PPM GENERATOR CHANGEOUT;  Surgeon: Sanda Klein, MD;  Location: [facility];  Service: Cardiovascular;  Laterality: N/A;  . THYROIDECTOMY  11/04/2013  . THYROIDECTOMY N/A 11/04/2013   Procedure: THYROIDECTOMY;  Surgeon: Adin Hector, MD;  Location: [facility];  Service: General;  Laterality: N/A;    Family History  Problem Relation Age of Onset  . Thyroid disease Mother   . Heart disease Mother   . Thyroid disease Maternal Uncle   . Thyroid disease Maternal Grandmother   . Heart failure Maternal Grandmother   . Hypertension Maternal Grandmother   . Hypertension Maternal Grandfather     Social History   Socioeconomic History  . Marital status: Single    Spouse name: Not on file  . Number of children: Not on file  . Years of education: Not on file  . Highest education level: Not on file  Occupational History  . Not on file  Tobacco Use  . Smoking status: Current Every Day Smoker    Years: 0.50    Types: Cigars  . Smokeless tobacco: Never  Used  . Tobacco comment: 11/04/2013 "smoke 3 black N milds/day"  Substance and Sexual Activity  . Alcohol use: Yes    Comment: 11/04/2013 "mixed drinks or wine once a month  . Drug use: No    Types: Marijuana    Comment: none for 6 mos. since 03/2014  . Sexual activity: Yes    Birth control/protection: I.U.D.  Other Topics Concern  . Not on file  Social History Narrative   Epworth Sleepiness Scale = 12 (as of 10/22/2016)   Social Determinants of Health   Financial Resource Strain:   . Difficulty of Paying Living Expenses: Not on file  Food Insecurity:   . Worried About Charity fundraiser in the Last Year: Not on file  . Ran Out of Food in the Last Year: Not on file  Transportation Needs:   . Lack of Transportation (Medical): Not on file  . Lack of Transportation (Non-Medical): Not on file  Physical Activity:   . Days of Exercise per Week: Not on file  . Minutes of Exercise per Session: Not on file  Stress:   . Feeling of Stress : Not on file  Social Connections:   . Frequency of Communication with Friends and Family: Not on file  . Frequency of Social Gatherings with Friends  and Family: Not on file  . Attends Religious Services: Not on file  . Active Member of Clubs or Organizations: Not on file  . Attends Archivist Meetings: Not on file  . Marital Status: Not on file  Intimate Partner Violence:   . Fear of Current or Ex-Partner: Not on file  . Emotionally Abused: Not on file  . Physically Abused: Not on file  . Sexually Abused: Not on file    Outpatient Medications Prior to Visit  Medication Sig Dispense Refill  . clindamycin (CLINDAGEL) 1 % gel Apply topically 2 (two) times daily. (Patient not taking: Reported on 10/13/2019) 30 g 0  . cyclobenzaprine (FLEXERIL) 10 MG tablet TAKE 1 TABLET BY MOUTH TWICE DAILY AS NEEDED FOR MUSCLE SPASM 60 tablet 2  . gabapentin (NEURONTIN) 300 MG capsule Take 1 capsule (300 mg total) by mouth 3 (three) times daily. (Patient not  taking: Reported on 04/27/2019) 90 capsule 0  . levothyroxine (SYNTHROID) 125 MCG tablet Take 1 tablet (125 mcg total) by mouth daily before breakfast. 90 tablet 1  . SUMAtriptan (IMITREX) 50 MG tablet TAKE 1 TABLET (50 MG TOTAL) BY MOUTH EVERY 2 (TWO) HOURS AS NEEDED FOR MIGRAINE. MAY REPEAT IN 2 HOURS IF HEADACHE PERSISTS OR RECURS. (Patient not taking: Reported on 07/09/2019) 10 tablet 0  . tetracycline (SUMYCIN) 500 MG capsule Take 1 capsule (500 mg total) by mouth 2 (two) times daily. 60 capsule 3  . valACYclovir (VALTREX) 1000 MG tablet Take 1 tablet (1,000 mg total) by mouth 3 (three) times daily. (Patient not taking: Reported on 04/27/2019) 21 tablet 0   No facility-administered medications prior to visit.    Allergies  Allergen Reactions  . Banana Anaphylaxis    Swelling of throat  . 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 Latex Rash  . Penicillins Other (See Comments)    Has patient had a PCN reaction causing immediate rash, facial/tongue/throat swelling, SOB or lightheadedness with hypotension: unknown Has patient had a PCN reaction causing severe rash involving mucus membranes or skin necrosis:unknownHas patient had a PCN reaction that required hospitalization unknown Has patient had a PCN reaction occurring within the last 10 years: unknown If all of the above answers are "NO", then may proceed with Cephalosporin use.     ROS Review of Systems  Genitourinary: Positive for vaginal discharge.  All other systems reviewed and are negative.     Objective:    Physical Exam CONSTITUTIONAL: Well-developed, well-nourished female in no acute distress.  HENT:  Normocephalic, atraumatic, External right and left ear normal. Oropharynx is clear and moist EYES: Conjunctivae and EOM are normal. Pupils are equal, round, and reactive to light. No scleral icterus.  NECK: Normal range of motion, supple, no masses.  Normal  thyroid.  SKIN: Skin is warm and dry. No rash noted. Not diaphoretic. No erythema. No pallor. [place]: Alert and oriented to person, place, and time. Normal reflexes, muscle tone coordination. No cranial nerve deficit noted. PSYCHIATRIC: Normal mood and affect. Normal behavior. Normal judgment and thought content. CARDIOVASCULAR: Normal heart rate noted, regular rhythm RESPIRATORY: Clear to auscultation bilaterally. Effort and breath sounds normal, no problems with respiration noted. BREASTS:Taught SBE ABDOMEN: Soft, normal bowel sounds, no distention noted.  No tenderness, rebound or guarding.  PELVIC: Normal appearing external genitalia; with small discolored bumps with fluid present will sample  normal appearing vaginal mucosa and cervix.  No abnormal discharge  noted.  Pap smear obtained.  Normal uterine size, no other palpable masses, no uterine or adnexal tenderness. MUSCULOSKELETAL: Normal range of motion. No tenderness.  No cyanosis, clubbing, or edema.  2+ distal pulses. BP (!) 131/94   Pulse (!) 103   Temp (!) 97.3 F (36.3 C) (Oral)   Resp 16   Wt 181 lb 3.2 oz (82.2 kg)   SpO2 100%   BMI 34.24 kg/m  Wt Readings from Last 3 Encounters:  10/13/19 181 lb 3.2 oz (82.2 kg)  04/27/19 177 lb 6.4 oz (80.5 kg)  10/16/18 153 lb 3.2 oz (69.5 kg)     Health Maintenance Due  Topic Date Due  . INFLUENZA VACCINE  04/25/2019    There are no preventive care reminders to display for this patient.  Lab Results  Component Value Date   TSH 0.777 10/13/2019   Lab Results  Component Value Date   WBC 10.7 10/13/2019   HGB 13.7 10/13/2019   HCT 39.8 10/13/2019   MCV 94 10/13/2019   PLT 383 10/13/2019   Lab Results  Component Value Date   NA 141 10/13/2019   K 4.5 10/13/2019   CO2 23 10/13/2019   GLUCOSE 89 10/13/2019   BUN 9 10/13/2019   CREATININE 0.92 10/13/2019   BILITOT <0.2 10/13/2019   ALKPHOS 112 10/13/2019   AST 25 10/13/2019   ALT 28 10/13/2019   PROT 7.0  10/13/2019   ALBUMIN 4.5 10/13/2019   CALCIUM 9.2 10/13/2019   ANIONGAP 6 02/23/2017   GFR 101.94 08/06/2016   No results found for: CHOL No results found for: HDL No results found for: LDLCALC No results found for: TRIG No results found for: CHOLHDL No results found for: HGBA1C    Assessment & Plan:  Jean Rowe was seen today for gynecologic exam and medication refill.  Diagnoses and all orders for this visit:  Goiter, nontoxic, multinodular -     TSH + free T4  Cervical cancer screening -     Cancel: Cytology - PAP -     Cytology - PAP  Screening for STD (sexually transmitted disease) -     Cancel: Cervicovaginal ancillary only -     Cervicovaginal ancillary only  Amenorrhea -     CBC with Differential  Elevated blood pressure reading without diagnosis of hypertension Discussed low-sodium, DASH diet, medication compliance, 150 minutes of moderate intensity exercise per week. -     CMP14+EGFR  Vaginal sore Rule out HSV2  -     Anaerobic and Aerobic Culture   Follow-up: Return if symptoms worsen or fail to improve.    Kerin Perna, NP

## 2019-10-14 LAB — CMP14+EGFR
ALT: 28 IU/L (ref 0–32)
AST: 25 IU/L (ref 0–40)
Albumin/Globulin Ratio: 1.8 (ref 1.2–2.2)
Albumin: 4.5 g/dL (ref 3.8–4.8)
Alkaline Phosphatase: 112 IU/L (ref 39–117)
BUN/Creatinine Ratio: 10 (ref 9–23)
BUN: 9 mg/dL (ref 6–20)
Bilirubin Total: <0.2 mg/dL (ref 0.0–1.2)
CO2: 23 mmol/L (ref 20–29)
Calcium: 9.2 mg/dL (ref 8.7–10.2)
Chloride: 105 mmol/L (ref 96–106)
Creatinine, Ser: 0.92 mg/dL (ref 0.57–1.00)
GFR calc Af Amer: 95 mL/min/{1.73_m2} (ref >59)
GFR calc non Af Amer: 82 mL/min/{1.73_m2} (ref >59)
Globulin, Total: 2.5 g/dL (ref 1.5–4.5)
Glucose: 89 mg/dL (ref 65–99)
Potassium: 4.5 mmol/L (ref 3.5–5.2)
Sodium: 141 mmol/L (ref 134–144)
Total Protein: 7 g/dL (ref 6.0–8.5)

## 2019-10-14 LAB — CBC WITH DIFFERENTIAL/PLATELET
Basophils Absolute: 0 10*3/uL (ref 0.0–0.2)
Basos: 0 %
EOS (ABSOLUTE): 0.2 10*3/uL (ref 0.0–0.4)
Eos: 2 %
Hematocrit: 39.8 % (ref 34.0–46.6)
Hemoglobin: 13.7 g/dL (ref 11.1–15.9)
Immature Grans (Abs): 0 10*3/uL (ref 0.0–0.1)
Immature Granulocytes: 0 %
Lymphocytes Absolute: 2.7 10*3/uL (ref 0.7–3.1)
Lymphs: 26 %
MCH: 32.3 pg (ref 26.6–33.0)
MCHC: 34.4 g/dL (ref 31.5–35.7)
MCV: 94 fL (ref 79–97)
Monocytes Absolute: 0.5 10*3/uL (ref 0.1–0.9)
Monocytes: 5 %
Neutrophils Absolute: 7.2 10*3/uL — ABNORMAL HIGH (ref 1.4–7.0)
Neutrophils: 67 %
Platelets: 383 10*3/uL (ref 150–450)
RBC: 4.24 x10E6/uL (ref 3.77–5.28)
RDW: 11.8 % (ref 11.7–15.4)
WBC: 10.7 10*3/uL (ref 3.4–10.8)

## 2019-10-14 LAB — CERVICOVAGINAL ANCILLARY ONLY
Bacterial Vaginitis (gardnerella): NEGATIVE
Candida Glabrata: NEGATIVE
Candida Vaginitis: NEGATIVE
Chlamydia: NEGATIVE
Comment: NEGATIVE
Comment: NEGATIVE
Comment: NEGATIVE
Comment: NEGATIVE
Comment: NEGATIVE
Comment: NORMAL
Neisseria Gonorrhea: NEGATIVE
Trichomonas: NEGATIVE

## 2019-10-14 LAB — TSH+FREE T4
Free T4: 0.98 ng/dL (ref 0.82–1.77)
TSH: 0.777 u[IU]/mL (ref 0.450–4.500)

## 2019-10-14 LAB — CYTOLOGY - PAP: Diagnosis: NEGATIVE

## 2019-10-17 LAB — ANAEROBIC AND AEROBIC CULTURE

## 2019-10-21 ENCOUNTER — Telehealth (INDEPENDENT_AMBULATORY_CARE_PROVIDER_SITE_OTHER): Payer: Self-pay

## 2019-10-21 NOTE — Telephone Encounter (Signed)
Patient called in regards to her labs results. Patient has a question about her thyroid levels and would like to know if PCP will refill thyroid medication.  Please advice [phone]  Thank you Lula Olszewski

## 2019-10-21 NOTE — Telephone Encounter (Signed)
FWD to PCP

## 2019-10-23 ENCOUNTER — Encounter (INDEPENDENT_AMBULATORY_CARE_PROVIDER_SITE_OTHER): Payer: Self-pay | Admitting: Primary Care

## 2019-10-23 NOTE — Telephone Encounter (Signed)
Patient called in regards to medication refill. Patient will like to know the status of her Rx. Please refer to telephone encounter oh Jan 27.  Please advice [phone]

## 2019-10-26 ENCOUNTER — Other Ambulatory Visit (INDEPENDENT_AMBULATORY_CARE_PROVIDER_SITE_OTHER): Payer: Self-pay | Admitting: Primary Care

## 2019-10-26 ENCOUNTER — Encounter (INDEPENDENT_AMBULATORY_CARE_PROVIDER_SITE_OTHER): Payer: Self-pay | Admitting: Primary Care

## 2019-10-26 MED ORDER — LEVOTHYROXINE SODIUM 125 MCG PO TABS: 125.0000 ug | ORAL_TABLET | Freq: Every day | ORAL | 1 refills | Status: DC

## 2019-10-26 MED FILL — LEVOTHYROXINE 125 MCG TAB: 125 | 30 days supply | Qty: 30 | Fill #0

## 2019-11-02 ENCOUNTER — Encounter: Payer: No Typology Code available for payment source | Admitting: Cardiovascular Disease

## 2019-11-16 ENCOUNTER — Encounter: Payer: No Typology Code available for payment source | Admitting: Cardiovascular Disease

## 2019-11-23 MED FILL — CYCLOBENZAPRINE 10 MG TAB: 10 | 30 days supply | Qty: 60 | Fill #1

## 2019-12-21 ENCOUNTER — Encounter: Payer: No Typology Code available for payment source | Admitting: Cardiovascular Disease

## 2019-12-30 ENCOUNTER — Ambulatory Visit (INDEPENDENT_AMBULATORY_CARE_PROVIDER_SITE_OTHER): Payer: No Typology Code available for payment source | Admitting: *Deleted

## 2019-12-30 DIAGNOSIS — R55 Syncope and collapse: Secondary | ICD-10-CM

## 2019-12-30 MED FILL — LEVOTHYROXINE 125 MCG TAB: 125 | 30 days supply | Qty: 30 | Fill #1

## 2020-01-04 ENCOUNTER — Encounter: Payer: Self-pay | Admitting: Cardiovascular Disease

## 2020-01-04 LAB — CUP PACEART REMOTE DEVICE CHECK
Battery Remaining Longevity: 156 mo
Battery Remaining Percentage: 100 %
Brady Statistic RA Percent Paced: 0 %
Brady Statistic RV Percent Paced: 0 %
Date Time Interrogation Session: 20210408093400
Implantable Lead Implant Date: 20060119
Implantable Lead Implant Date: 20060119
Implantable Lead Location: 753859
Implantable Lead Location: 753860
Implantable Lead Model: 4456
Implantable Lead Model: 4469
Implantable Lead Serial Number: 439053
Implantable Lead Serial Number: 457210
Implantable Pulse Generator Implant Date: 20180926
Lead Channel Impedance Value: 392 Ohm
Lead Channel Impedance Value: 416 Ohm
Lead Channel Pacing Threshold Amplitude: 0.4 V
Lead Channel Pacing Threshold Amplitude: 0.8 V
Lead Channel Pacing Threshold Pulse Width: 0.4 ms
Lead Channel Pacing Threshold Pulse Width: 0.4 ms
Lead Channel Setting Pacing Amplitude: 2 V
Lead Channel Setting Pacing Amplitude: 2.5 V
Lead Channel Setting Pacing Pulse Width: 0.4 ms
Lead Channel Setting Sensing Sensitivity: 2.5 mV
Pulse Gen Serial Number: 789100

## 2020-01-04 NOTE — Progress Notes (Signed)
PPM Remote  

## 2020-01-11 MED FILL — CYCLOBENZAPRINE 10 MG TAB: 10 | 30 days supply | Qty: 60 | Fill #2

## 2020-02-23 ENCOUNTER — Encounter: Payer: Self-pay | Admitting: Gastroenterology

## 2020-02-24 ENCOUNTER — Other Ambulatory Visit (INDEPENDENT_AMBULATORY_CARE_PROVIDER_SITE_OTHER): Payer: Self-pay | Admitting: Primary Care

## 2020-02-24 DIAGNOSIS — M797 Fibromyalgia: Secondary | ICD-10-CM

## 2020-02-24 NOTE — Telephone Encounter (Signed)
Sent to PCP ?

## 2020-02-25 MED FILL — LEVOTHYROXINE SODIUM 125 MC: 125 | 30 days supply | Qty: 30 | Fill #2

## 2020-02-26 MED FILL — CYCLOBENZAPRINE 10 MG TAB: 10 | 30 days supply | Qty: 60 | Fill #0

## 2020-03-30 ENCOUNTER — Ambulatory Visit (INDEPENDENT_AMBULATORY_CARE_PROVIDER_SITE_OTHER): Payer: No Typology Code available for payment source | Admitting: *Deleted

## 2020-03-30 DIAGNOSIS — R55 Syncope and collapse: Secondary | ICD-10-CM

## 2020-03-30 LAB — CUP PACEART REMOTE DEVICE CHECK
Battery Remaining Longevity: 156 mo
Battery Remaining Percentage: 100 %
Brady Statistic RA Percent Paced: 0 %
Brady Statistic RV Percent Paced: 0 %
Date Time Interrogation Session: 20210707030200
Implantable Lead Implant Date: 20060119
Implantable Lead Implant Date: 20060119
Implantable Lead Location: 753859
Implantable Lead Location: 753860
Implantable Lead Model: 4456
Implantable Lead Model: 4469
Implantable Lead Serial Number: 439053
Implantable Lead Serial Number: 457210
Implantable Pulse Generator Implant Date: 20180926
Lead Channel Impedance Value: 411 Ohm
Lead Channel Impedance Value: 419 Ohm
Lead Channel Pacing Threshold Amplitude: 0.4 V
Lead Channel Pacing Threshold Amplitude: 0.7 V
Lead Channel Pacing Threshold Pulse Width: 0.4 ms
Lead Channel Pacing Threshold Pulse Width: 0.4 ms
Lead Channel Setting Pacing Amplitude: 2 V
Lead Channel Setting Pacing Amplitude: 2.5 V
Lead Channel Setting Pacing Pulse Width: 0.4 ms
Lead Channel Setting Sensing Sensitivity: 2.5 mV
Pulse Gen Serial Number: 789100

## 2020-04-01 NOTE — Progress Notes (Signed)
Remote pacemaker transmission.   

## 2020-04-07 MED FILL — LEVOTHYROXINE SODIUM 125 MC: 125 | 30 days supply | Qty: 30 | Fill #3

## 2020-04-07 MED FILL — CYCLOBENZAPRINE 10 MG TAB: 10 | 30 days supply | Qty: 60 | Fill #1

## 2020-04-12 ENCOUNTER — Telehealth (INDEPENDENT_AMBULATORY_CARE_PROVIDER_SITE_OTHER): Payer: Self-pay | Admitting: Primary Care

## 2020-04-12 NOTE — Telephone Encounter (Cosign Needed)
Not on current medication list.   

## 2020-04-12 NOTE — Telephone Encounter (Signed)
Medication Refill - Medication: metroNIDAZOLE (FLAGYL) 500 MG tablet    Has the patient contacted their pharmacy? No. (Agent: If no, request that the patient contact the pharmacy for the refill.) (Agent: If yes, when and what did the pharmacy advise?)  Preferred Pharmacy (with phone number or street name):  [facility] - [place], [place] - [address] Phone:  [phone]  Fax:  [phone]      Agent: Please be advised that RX refills may take up to 3 business days. We ask that you follow-up with your pharmacy.

## 2020-04-12 NOTE — Telephone Encounter (Signed)
Forwarded to provider.

## 2020-04-13 ENCOUNTER — Ambulatory Visit: Payer: No Typology Code available for payment source | Admitting: Gastroenterology

## 2020-04-14 ENCOUNTER — Other Ambulatory Visit (INDEPENDENT_AMBULATORY_CARE_PROVIDER_SITE_OTHER): Payer: Self-pay | Admitting: Primary Care

## 2020-04-14 ENCOUNTER — Other Ambulatory Visit (HOSPITAL_COMMUNITY)
Admission: RE | Admit: 2020-04-14 | Discharge: 2020-04-14 | Disposition: A | Discharge status: Home / Self Care | Payer: BC Managed Care – PPO | Source: Ambulatory Visit | Attending: Primary Care | Admitting: Primary Care

## 2020-04-14 DIAGNOSIS — N898 Other specified noninflammatory disorders of vagina: Secondary | ICD-10-CM | POA: Diagnosis present

## 2020-04-14 NOTE — Telephone Encounter (Signed)
Called patient she will be in tomorrow for cervical anxcillary before medication render

## 2020-04-15 ENCOUNTER — Other Ambulatory Visit: Payer: Self-pay

## 2020-04-15 ENCOUNTER — Other Ambulatory Visit (INDEPENDENT_AMBULATORY_CARE_PROVIDER_SITE_OTHER): Payer: Medicaid Other

## 2020-04-18 ENCOUNTER — Other Ambulatory Visit (INDEPENDENT_AMBULATORY_CARE_PROVIDER_SITE_OTHER): Payer: Self-pay | Admitting: Primary Care

## 2020-04-18 DIAGNOSIS — L700 Acne vulgaris: Secondary | ICD-10-CM

## 2020-04-18 LAB — CERVICOVAGINAL ANCILLARY ONLY
Bacterial Vaginitis (gardnerella): POSITIVE — AB
Candida Glabrata: NEGATIVE
Candida Vaginitis: POSITIVE — AB
Chlamydia: NEGATIVE
Comment: NEGATIVE
Comment: NEGATIVE
Comment: NEGATIVE
Comment: NEGATIVE
Comment: NEGATIVE
Comment: NORMAL
Neisseria Gonorrhea: NEGATIVE
Trichomonas: NEGATIVE

## 2020-04-18 MED ORDER — FLUCONAZOLE 150 MG PO TABS: 150.0000 mg | ORAL_TABLET | Freq: Once | ORAL | 0 refills | Status: AC

## 2020-04-18 MED ORDER — CLINDAMYCIN PHOSPHATE 1 % EX GEL: Freq: Two times a day (BID) | CUTANEOUS | 0 refills | Status: DC

## 2020-04-19 ENCOUNTER — Encounter (INDEPENDENT_AMBULATORY_CARE_PROVIDER_SITE_OTHER): Payer: Self-pay | Admitting: Primary Care

## 2020-04-19 DIAGNOSIS — N760 Acute vaginitis: Secondary | ICD-10-CM

## 2020-04-19 MED FILL — FLUCONAZOLE 150 MG TABLET: 150 | 3 days supply | Qty: 2 | Fill #0

## 2020-04-20 MED FILL — CLINDAMYCIN PHOSPHATE 1 % G: 1 | 15 days supply | Qty: 30 | Fill #0

## 2020-04-21 ENCOUNTER — Telehealth (INDEPENDENT_AMBULATORY_CARE_PROVIDER_SITE_OTHER): Payer: Self-pay | Admitting: Primary Care

## 2020-04-21 MED ORDER — METRONIDAZOLE 500 MG PO TABS: 500.0000 mg | ORAL_TABLET | Freq: Two times a day (BID) | ORAL | 0 refills | Status: DC

## 2020-04-21 MED FILL — metroNIDAZOLE 500 MG TABS: 500 | 7 days supply | Qty: 14 | Fill #0

## 2020-04-21 NOTE — Telephone Encounter (Signed)
Pt called requesting a Rx in pill form instead of topical cream for bacterial vaginosis. Please advise

## 2020-04-21 NOTE — Telephone Encounter (Signed)
Please send pills for patient.

## 2020-04-25 ENCOUNTER — Encounter (INDEPENDENT_AMBULATORY_CARE_PROVIDER_SITE_OTHER): Payer: Self-pay | Admitting: Primary Care

## 2020-04-25 NOTE — Telephone Encounter (Signed)
Sent patient a Production manager to call office and schedule appointment.

## 2020-04-25 NOTE — Telephone Encounter (Signed)
Pt called in and stated she needs a refill on her the med for the yeast infection.  She stated that it is not getting any better and would like it refilled .  She stated she now thinks she has a UTI, she would like to know does she need to come over and do a urine sample?  Pharmacy - [facility]  Best number [phone]

## 2020-04-29 ENCOUNTER — Ambulatory Visit (INDEPENDENT_AMBULATORY_CARE_PROVIDER_SITE_OTHER): Payer: BC Managed Care – PPO | Admitting: Primary Care

## 2020-04-29 ENCOUNTER — Encounter (INDEPENDENT_AMBULATORY_CARE_PROVIDER_SITE_OTHER): Payer: Self-pay | Admitting: Primary Care

## 2020-04-29 ENCOUNTER — Other Ambulatory Visit: Payer: Self-pay

## 2020-04-29 VITALS — BP 134/94 | HR 76 | Temp 98.2°F | Ht 61.0 in | Wt 174.4 lb

## 2020-04-29 DIAGNOSIS — R300 Dysuria: Secondary | ICD-10-CM | POA: Diagnosis not present

## 2020-04-29 DIAGNOSIS — N898 Other specified noninflammatory disorders of vagina: Secondary | ICD-10-CM | POA: Diagnosis not present

## 2020-04-29 DIAGNOSIS — E039 Hypothyroidism, unspecified: Secondary | ICD-10-CM | POA: Diagnosis not present

## 2020-04-29 DIAGNOSIS — F17200 Nicotine dependence, unspecified, uncomplicated: Secondary | ICD-10-CM | POA: Diagnosis not present

## 2020-04-29 LAB — POCT URINALYSIS DIP (CLINITEK)
Bilirubin, UA: NEGATIVE
Blood, UA: NEGATIVE
Glucose, UA: NEGATIVE mg/dL
Ketones, POC UA: NEGATIVE mg/dL
Leukocytes, UA: NEGATIVE
Nitrite, UA: NEGATIVE
POC PROTEIN,UA: NEGATIVE
Spec Grav, UA: >=1.03 — AB (ref 1.010–1.025)
Urobilinogen, UA: 0.2 E.U./dL
pH, UA: 5.5 (ref 5.0–8.0)

## 2020-04-29 MED ORDER — CLINDAMYCIN PHOSPHATE 2 % VA CREA: 1.0000 | TOPICAL_CREAM | Freq: Every day | VAGINAL | 0 refills | Status: DC

## 2020-04-29 MED ORDER — FLUCONAZOLE 150 MG PO TABS: 150.0000 mg | ORAL_TABLET | Freq: Once | ORAL | 0 refills | Status: AC

## 2020-04-29 MED FILL — CLINDAMYCIN 2% VAGINAL CRM: 2 | 7 days supply | Qty: 40 | Fill #0

## 2020-04-29 MED FILL — FLUCONAZOLE 150 MG TABLET: 150 | 1 days supply | Qty: 1 | Fill #0

## 2020-04-29 NOTE — Patient Instructions (Signed)
   Managing Your Hypertension Hypertension is commonly called high blood pressure. This is when the force of your blood pressing against the walls of your arteries is too strong. Arteries are blood vessels that carry blood from your heart throughout your body. Hypertension forces the heart to work harder to pump blood, and may cause the arteries to become narrow or stiff. Having untreated or uncontrolled hypertension can cause heart attack, stroke, kidney disease, and other problems. What are blood pressure readings? A blood pressure reading consists of a higher number over a lower number. Ideally, your blood pressure should be below 120/80. The first ("top") number is called the systolic pressure. It is a measure of the pressure in your arteries as your heart beats. The second ("bottom") number is called the diastolic pressure. It is a measure of the pressure in your arteries as the heart relaxes. What does my blood pressure reading mean? Blood pressure is classified into four stages. Based on your blood pressure reading, your health care provider may use the following stages to determine what type of treatment you need, if any. Systolic pressure and diastolic pressure are measured in a unit called mm Hg. Normal  Systolic pressure: below 120.  Diastolic pressure: below 80. Elevated  Systolic pressure: 120-129.  Diastolic pressure: below 80. Hypertension stage 1  Systolic pressure: 130-139.  Diastolic pressure: 80-89. Hypertension stage 2  Systolic pressure: 140 or above.  Diastolic pressure: 90 or above. What health risks are associated with hypertension? Managing your hypertension is an important responsibility. Uncontrolled hypertension can lead to:  A heart attack.  A stroke.  A weakened blood vessel (aneurysm).  Heart failure.  Kidney damage.  Eye damage.  Metabolic syndrome.  Memory and concentration problems. What changes can I make to manage my  hypertension? Hypertension can be managed by making lifestyle changes and possibly by taking medicines. Your health care provider will help you make a plan to bring your blood pressure within a normal range. Eating and drinking   Eat a diet that is high in fiber and potassium, and low in salt (sodium), added sugar, and fat. An example eating plan is called the DASH (Dietary Approaches to Stop Hypertension) diet. To eat this way: ? Eat plenty of fresh fruits and vegetables. Try to fill half of your plate at each meal with fruits and vegetables. ? Eat whole grains, such as whole wheat pasta, brown rice, or whole grain bread. Fill about one quarter of your plate with whole grains. ? Eat low-fat diary products. ? Avoid fatty cuts of meat, processed or cured meats, and poultry with skin. Fill about one quarter of your plate with lean proteins such as fish, chicken without skin, beans, eggs, and tofu. ? Avoid premade and processed foods. These tend to be higher in sodium, added sugar, and fat.  Reduce your daily sodium intake. Most people with hypertension should eat less than 1,500 mg of sodium a day.  Limit alcohol intake to no more than 1 drink a day for nonpregnant women and 2 drinks a day for men. One drink equals 12 oz of beer, 5 oz of wine, or 1 oz of hard liquor. Lifestyle  Work with your health care provider to maintain a healthy body weight, or to lose weight. Ask what an ideal weight is for you.  Get at least 30 minutes of exercise that causes your heart to beat faster (aerobic exercise) most days of the week. Activities may include walking, swimming, or biking.    Include exercise to strengthen your muscles (resistance exercise), such as weight lifting, as part of your weekly exercise routine. Try to do these types of exercises for 30 minutes at least 3 days a week.  Do not use any products that contain nicotine or tobacco, such as cigarettes and e-cigarettes. If you need help quitting,  ask your health care provider.  Control any long-term (chronic) conditions you have, such as high cholesterol or diabetes. Monitoring  Monitor your blood pressure at home as told by your health care provider. Your personal target blood pressure may vary depending on your medical conditions, your age, and other factors.  Have your blood pressure checked regularly, as often as told by your health care provider. Working with your health care provider  Review all the medicines you take with your health care provider because there may be side effects or interactions.  Talk with your health care provider about your diet, exercise habits, and other lifestyle factors that may be contributing to hypertension.  Visit your health care provider regularly. Your health care provider can help you create and adjust your plan for managing hypertension. Will I need medicine to control my blood pressure? Your health care provider may prescribe medicine if lifestyle changes are not enough to get your blood pressure under control, and if:  Your systolic blood pressure is 130 or higher.  Your diastolic blood pressure is 80 or higher. Take medicines only as told by your health care provider. Follow the directions carefully. Blood pressure medicines must be taken as prescribed. The medicine does not work as well when you skip doses. Skipping doses also puts you at risk for problems. Contact a health care provider if:  You think you are having a reaction to medicines you have taken.  You have repeated (recurrent) headaches.  You feel dizzy.  You have swelling in your ankles.  You have trouble with your vision. Get help right away if:  You develop a severe headache or confusion.  You have unusual weakness or numbness, or you feel faint.  You have severe pain in your chest or abdomen.  You vomit repeatedly.  You have trouble breathing. Summary  Hypertension is when the force of blood pumping  through your arteries is too strong. If this condition is not controlled, it may put you at risk for serious complications.  Your personal target blood pressure may vary depending on your medical conditions, your age, and other factors. For most people, a normal blood pressure is less than 120/80.  Hypertension is managed by lifestyle changes, medicines, or both. Lifestyle changes include weight loss, eating a healthy, low-sodium diet, exercising more, and limiting alcohol. This information is not intended to replace advice given to you by your health care provider. Make sure you discuss any questions you have with your health care provider. Document Revised: 01/02/2019 Document Reviewed: 08/08/2016 Elsevier Patient Education  2020 Elsevier Inc.  

## 2020-04-29 NOTE — Progress Notes (Signed)
Established Patient Office Visit  Subjective:  Patient ID: Jean Rowe, female    DOB: 23-Oct-1985  Age: 34 y.o. MRN: [id number]  CC:  Chief Complaint  Patient presents with   burning with urination    HPI Jean Rowe presents for follow up on bacterial vaginosis feels never resolved. She was unable to take prescribed medication.  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   Panic attacks    Symptomatic bradycardia    Thyroid disease     Past Surgical History:  Procedure Laterality Date   DILATION AND CURETTAGE OF UTERUS  2013   INSERT / REPLACE / REMOVE PACEMAKER  09/2004   PPM GENERATOR CHANGEOUT N/A 06/19/2017   Procedure: PPM GENERATOR CHANGEOUT;  Surgeon: Thurmon Fair, MD;  Location: [facility];  Service: Cardiovascular;  Laterality: N/A;   THYROIDECTOMY  11/04/2013   THYROIDECTOMY N/A 11/04/2013   Procedure: THYROIDECTOMY;  Surgeon: Ernestene Mention, MD;  Location: [facility] OR;  Service: General;  Laterality: N/A;    Family History  Problem Relation Age of Onset   Thyroid disease Mother    Heart disease Mother    Thyroid disease Maternal Uncle    Thyroid disease Maternal Grandmother    Heart failure Maternal Grandmother    Hypertension Maternal Grandmother    Hypertension Maternal Grandfather     Social History   Socioeconomic History   Marital status: Single    Spouse name: Not on file   Number of children: Not on file   Years of education: Not on file   Highest education level: Not on file  Occupational History   Not on file  Tobacco Use   Smoking status: Current Every Day Smoker    Years: 0.50    Types: Cigars   Smokeless tobacco: Never Used    Tobacco comment: 11/04/2013 "smoke 3 black N milds/day"  Substance and Sexual Activity   Alcohol use: Yes    Comment: 11/04/2013 "mixed drinks or wine once a month   Drug use: No    Types: Marijuana    Comment: none for 6 mos. since 03/2014   Sexual activity: Yes    Birth control/protection: I.U.D.  Other Topics Concern   Not on file  Social History Narrative   Epworth Sleepiness Scale = 12 (as of 10/22/2016)   Social Determinants of Health   Financial Resource Strain:    Difficulty of Paying Living Expenses:   Food Insecurity:    Worried About Programme researcher, broadcasting/film/video in the Last Year:    Barista in the Last Year:   Transportation Needs:    Freight forwarder (Medical):    Lack of Transportation (Non-Medical):   Physical Activity:    Days of Exercise per Week:    Minutes of Exercise per Session:   Stress:    Feeling of Stress :   Social Connections:    Frequency of Communication with Friends and Family:    Frequency of Social Gatherings with Friends and Family:    Attends Religious Services:    Active Member of Clubs or Organizations:    Attends [employer]  or Organization Meetings:    Marital Status:   Intimate Partner Violence:    Fear of Current or Ex-Partner:    Emotionally Abused:    Physically Abused:    Sexually Abused:     Outpatient Medications Prior to Visit  Medication Sig Dispense Refill   levothyroxine (SYNTHROID) 125 MCG tablet Take 1 tablet (125 mcg total) by mouth daily before breakfast. 90 tablet 1   cyclobenzaprine (FLEXERIL) 10 MG tablet TAKE 1 TABLET BY MOUTH TWICE DAILY AS NEEDED FOR MUSCLE SPASM 60 tablet 2   tetracycline (SUMYCIN) 500 MG capsule Take 1 capsule (500 mg total) by mouth 2 (two) times daily. (Patient not taking: Reported on 04/29/2020) 60 capsule 3   valACYclovir (VALTREX) 1000 MG tablet Take 1 tablet (1,000 mg total) by mouth 3 (three) times daily. (Patient not taking: Reported on 04/27/2019) 21 tablet 0    gabapentin (NEURONTIN) 300 MG capsule Take 1 capsule (300 mg total) by mouth 3 (three) times daily. (Patient not taking: Reported on 04/27/2019) 90 capsule 0   metroNIDAZOLE (FLAGYL) 500 MG tablet Take 1 tablet (500 mg total) by mouth 2 (two) times daily. 14 tablet 0   SUMAtriptan (IMITREX) 50 MG tablet TAKE 1 TABLET (50 MG TOTAL) BY MOUTH EVERY 2 (TWO) HOURS AS NEEDED FOR MIGRAINE. MAY REPEAT IN 2 HOURS IF HEADACHE PERSISTS OR RECURS. (Patient not taking: Reported on 07/09/2019) 10 tablet 0   No facility-administered medications prior to visit.    Allergies  Allergen Reactions   Banana Anaphylaxis    Swelling of throat   Other Other (See Comments)    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     ROS Review of Systems    Objective:    Physical Exam  BP (!) 134/94 (BP Location: Right Arm, Patient Position: Sitting, Cuff Size: Normal)    Pulse 76    Temp 98.2 F (36.8 C) (Oral)    Ht 5\' 1"  (1.549 m)    Wt 174 lb 6.4 oz (79.1 kg)    SpO2 94%    BMI 32.95 kg/m  Wt Readings from Last 3 Encounters:  04/29/20 174 lb 6.4 oz (79.1 kg)  10/13/19 181 lb 3.2 oz (82.2 kg)  04/27/19 177 lb 6.4 oz (80.5 kg)     Health Maintenance Due  Topic Date Due   Hepatitis C Screening  Never done   COVID-19 Vaccine (1) Never done   INFLUENZA VACCINE  04/24/2020    There are no preventive care reminders to display for this patient.  Lab Results  Component  Value Date   TSH 3.650 04/29/2020   Lab Results  Component Value Date   WBC 10.7 10/13/2019   HGB 13.7 10/13/2019   HCT 39.8 10/13/2019   MCV 94 10/13/2019   PLT 383 10/13/2019   Lab Results  Component Value Date   NA 141 10/13/2019   K 4.5 10/13/2019   CO2 23 10/13/2019   GLUCOSE 89 10/13/2019   BUN 9 10/13/2019  CREATININE 0.92 10/13/2019   BILITOT <0.2 10/13/2019   ALKPHOS 112 10/13/2019   AST 25 10/13/2019   ALT 28 10/13/2019   PROT 7.0 10/13/2019   ALBUMIN 4.5 10/13/2019   CALCIUM 9.2 10/13/2019   ANIONGAP 6 02/23/2017   GFR 101.94 08/06/2016   No results found for: CHOL No results found for: HDL No results found for: LDLCALC No results found for: TRIG No results found for: CHOLHDL No results found for: DEYC1K    Assessment & Plan:  Drinda was seen today for burning with urination.  Diagnoses and all orders for this visit:  Burning with urination -     POCT URINALYSIS DIP (CLINITEK)  Vaginal discharge Cervical anxcillary   Tobacco dependence   Increased risk for lung cancer and other respiratory diseases recommend cessation.  This will be reminded at each clinical visit.  Hypothyroidism, unspecified type -     TSH + free T4  Other orders -     fluconazole (DIFLUCAN) 150 MG tablet; Take 1 tablet (150 mg total) by mouth once for 1 dose. -     clindamycin (CLEOCIN) 2 % vaginal cream; Place 1 Applicatorful vaginally at bedtime.   Meds ordered this encounter  Medications   fluconazole (DIFLUCAN) 150 MG tablet    Sig: Take 1 tablet (150 mg total) by mouth once for 1 dose.    Dispense:  1 tablet    Refill:  0   clindamycin (CLEOCIN) 2 % vaginal cream    Sig: Place 1 Applicatorful vaginally at bedtime.    Dispense:  40 g    Refill:  0    Follow-up: No follow-ups on file.    Grayce Sessions, NP

## 2020-04-29 NOTE — Progress Notes (Signed)
Metronidazole caused patient to have sores in her mouth

## 2020-04-30 LAB — TSH+FREE T4
Free T4: 1.09 ng/dL (ref 0.82–1.77)
TSH: 3.65 u[IU]/mL (ref 0.450–4.500)

## 2020-05-02 ENCOUNTER — Other Ambulatory Visit (HOSPITAL_COMMUNITY)
Admission: RE | Admit: 2020-05-02 | Discharge: 2020-05-02 | Disposition: A | Discharge status: Home / Self Care | Payer: BC Managed Care – PPO | Source: Ambulatory Visit | Attending: Primary Care | Admitting: Primary Care

## 2020-05-02 ENCOUNTER — Other Ambulatory Visit (INDEPENDENT_AMBULATORY_CARE_PROVIDER_SITE_OTHER): Payer: Self-pay | Admitting: Primary Care

## 2020-05-02 DIAGNOSIS — N898 Other specified noninflammatory disorders of vagina: Secondary | ICD-10-CM

## 2020-05-03 LAB — CERVICOVAGINAL ANCILLARY ONLY
Bacterial Vaginitis (gardnerella): POSITIVE — AB
Candida Glabrata: NEGATIVE
Candida Vaginitis: NEGATIVE
Chlamydia: NEGATIVE
Comment: NEGATIVE
Comment: NEGATIVE
Comment: NEGATIVE
Comment: NEGATIVE
Comment: NEGATIVE
Comment: NORMAL
Neisseria Gonorrhea: NEGATIVE
Trichomonas: NEGATIVE

## 2020-05-19 ENCOUNTER — Telehealth (INDEPENDENT_AMBULATORY_CARE_PROVIDER_SITE_OTHER): Payer: Self-pay | Admitting: Primary Care

## 2020-05-19 NOTE — Telephone Encounter (Signed)
Medication: cyclobenzaprine (FLEXERIL) 10 MG tablet [[id number]] , levothyroxine (SYNTHROID) 125 MCG tablet [[id number]]   Has the patient contacted their pharmacy? YES  (Agent: If no, request that the patient contact the pharmacy for the refill.) (Agent: If yes, when and what did the pharmacy advise?)  Preferred Pharmacy (with phone number or street name): [facility] - [place], [place] - [place] [address]  Phone:  [phone] Fax:  [phone]     Agent: Please be advised that RX refills may take up to 3 business days. We ask that you follow-up with your pharmacy.

## 2020-05-20 MED FILL — CYCLOBENZAPRINE 10 MG TAB: 10 | 30 days supply | Qty: 60 | Fill #2

## 2020-05-20 MED FILL — LEVOTHYROXINE SODIUM 125 MC: 125 | 30 days supply | Qty: 30 | Fill #4

## 2020-06-22 ENCOUNTER — Other Ambulatory Visit (INDEPENDENT_AMBULATORY_CARE_PROVIDER_SITE_OTHER): Payer: Self-pay | Admitting: Primary Care

## 2020-06-22 DIAGNOSIS — M797 Fibromyalgia: Secondary | ICD-10-CM

## 2020-06-22 NOTE — Telephone Encounter (Signed)
Requested medication (s) are due for refill today:yes  Requested medication (s) are on the active medication list:yes  Last refill:  02/26/20  #60-2 refills  Future visit scheduled: yes  Notes to clinic: Medication not delegated    Requested Prescriptions  Pending Prescriptions Disp Refills   cyclobenzaprine (FLEXERIL) 10 MG tablet [Pharmacy Med Name: CYCLOBENZAPRINE 10 MG TAB 10 Tablet] 60 tablet 2    Sig: TAKE 1 TABLET BY MOUTH TWICE DAILY AS NEEDED FOR MUSCLE SPASM      Not Delegated - Analgesics:  Muscle Relaxants Failed - 06/22/2020  9:08 AM      Failed - This refill cannot be delegated      Passed - Valid encounter within last 6 months    Recent Outpatient Visits           1 month ago Burning with urination   [facility] Grayce Sessions, NP   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, nontoxic, multinodular   [facility] Grayce Sessions, NP   11 months ago Acne comedone   [facility] Grayce Sessions, NP   1 year ago Trigeminal herpes zoster   [facility] Grayce Sessions, NP   1 year ago Postsurgical hypothyroidism   [facility] Hoy Register, MD       Future Appointments             In 1 week Randa Evens, Kinnie Scales, NP [facility]

## 2020-06-27 ENCOUNTER — Other Ambulatory Visit (INDEPENDENT_AMBULATORY_CARE_PROVIDER_SITE_OTHER): Payer: Self-pay | Admitting: Primary Care

## 2020-06-28 MED FILL — CYCLOBENZAPRINE 10 MG TAB: 10 | 30 days supply | Qty: 60 | Fill #0

## 2020-06-29 ENCOUNTER — Ambulatory Visit (INDEPENDENT_AMBULATORY_CARE_PROVIDER_SITE_OTHER): Payer: BC Managed Care – PPO

## 2020-06-29 ENCOUNTER — Ambulatory Visit (INDEPENDENT_AMBULATORY_CARE_PROVIDER_SITE_OTHER): Payer: BC Managed Care – PPO | Admitting: Primary Care

## 2020-06-29 DIAGNOSIS — R55 Syncope and collapse: Secondary | ICD-10-CM | POA: Diagnosis not present

## 2020-06-29 MED FILL — LEVOTHYROXINE SODIUM 125 MC: 125 | 30 days supply | Qty: 30 | Fill #5

## 2020-06-30 LAB — CUP PACEART REMOTE DEVICE CHECK
Battery Remaining Longevity: 162 mo
Battery Remaining Percentage: 100 %
Brady Statistic RA Percent Paced: 0 %
Brady Statistic RV Percent Paced: 0 %
Date Time Interrogation Session: 20211006034100
Implantable Lead Implant Date: 20060119
Implantable Lead Implant Date: 20060119
Implantable Lead Location: 753859
Implantable Lead Location: 753860
Implantable Lead Model: 4456
Implantable Lead Model: 4469
Implantable Lead Serial Number: 439053
Implantable Lead Serial Number: 457210
Implantable Pulse Generator Implant Date: 20180926
Lead Channel Impedance Value: 418 Ohm
Lead Channel Impedance Value: 445 Ohm
Lead Channel Pacing Threshold Amplitude: 0.4 V
Lead Channel Pacing Threshold Amplitude: 0.7 V
Lead Channel Pacing Threshold Pulse Width: 0.4 ms
Lead Channel Pacing Threshold Pulse Width: 0.4 ms
Lead Channel Setting Pacing Amplitude: 2 V
Lead Channel Setting Pacing Amplitude: 2.5 V
Lead Channel Setting Pacing Pulse Width: 0.4 ms
Lead Channel Setting Sensing Sensitivity: 2.5 mV
Pulse Gen Serial Number: 789100

## 2020-07-04 NOTE — Progress Notes (Signed)
Remote pacemaker transmission.   

## 2020-07-11 ENCOUNTER — Ambulatory Visit (INDEPENDENT_AMBULATORY_CARE_PROVIDER_SITE_OTHER): Payer: BC Managed Care – PPO | Admitting: Primary Care

## 2020-07-26 ENCOUNTER — Telehealth (INDEPENDENT_AMBULATORY_CARE_PROVIDER_SITE_OTHER): Payer: BC Managed Care – PPO | Admitting: Primary Care

## 2020-07-26 ENCOUNTER — Ambulatory Visit (INDEPENDENT_AMBULATORY_CARE_PROVIDER_SITE_OTHER): Payer: BC Managed Care – PPO | Admitting: Primary Care

## 2020-08-02 ENCOUNTER — Ambulatory Visit (INDEPENDENT_AMBULATORY_CARE_PROVIDER_SITE_OTHER): Payer: BC Managed Care – PPO | Admitting: Primary Care

## 2020-08-02 ENCOUNTER — Other Ambulatory Visit (INDEPENDENT_AMBULATORY_CARE_PROVIDER_SITE_OTHER): Payer: Self-pay | Admitting: Primary Care

## 2020-08-02 ENCOUNTER — Other Ambulatory Visit: Payer: Self-pay

## 2020-08-02 ENCOUNTER — Encounter (INDEPENDENT_AMBULATORY_CARE_PROVIDER_SITE_OTHER): Payer: Self-pay | Admitting: Primary Care

## 2020-08-02 VITALS — BP 136/97 | HR 93 | Temp 97.9°F | Ht 61.0 in | Wt 173.6 lb

## 2020-08-02 DIAGNOSIS — I10 Essential (primary) hypertension: Secondary | ICD-10-CM | POA: Diagnosis not present

## 2020-08-02 DIAGNOSIS — F411 Generalized anxiety disorder: Secondary | ICD-10-CM

## 2020-08-02 DIAGNOSIS — E039 Hypothyroidism, unspecified: Secondary | ICD-10-CM

## 2020-08-02 MED ORDER — LISINOPRIL-HYDROCHLOROTHIAZIDE 20-25 MG PO TABS: 1.0000 | ORAL_TABLET | Freq: Every day | ORAL | 3 refills | Status: DC

## 2020-08-02 MED ORDER — BUSPIRONE HCL 5 MG PO TABS: 5.0000 mg | ORAL_TABLET | Freq: Three times a day (TID) | ORAL | 1 refills | Status: DC

## 2020-08-02 MED FILL — CYCLOBENZAPRINE 10 MG TAB: 10 | 30 days supply | Qty: 60 | Fill #1

## 2020-08-02 MED FILL — busPIRone HCL 5 MG TABS: 5 | 30 days supply | Qty: 90 | Fill #0

## 2020-08-02 MED FILL — LEVOTHYROXINE SODIUM 125 MC: 125 | 30 days supply | Qty: 30 | Fill #0

## 2020-08-02 MED FILL — LISINOPRIL-HYDROCHLOROTHIAZ: 20-25 | 30 days supply | Qty: 30 | Fill #0

## 2020-08-02 NOTE — Progress Notes (Signed)
Established Patient Office Visit  Subjective:  Patient ID: Jean Rowe, female    DOB: 02-Aug-1986  Age: 34 y.o. MRN: [id number]  CC:  Chief Complaint  Patient presents with  . Hypertension    HPI Jean Rowe  Is a 34 year old female who presents for HTN management. She denies shortness of breath, headaches, chest pain or lower extremity edema  Past Medical History:  Diagnosis Date  . Acne   . Anxiety    panic attacks  . Daily headache   . Heart murmur   . History of IBS   . History of kidney infection   . Hypothyroidism   . Neurocardiogenic syncope   . Neurocardiogenic syncope   . Orthopnoea   . 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 Panic attacks   . Symptomatic bradycardia   . Thyroid disease     Past Surgical History:  Procedure Laterality Date  . DILATION AND CURETTAGE OF UTERUS  2013  . INSERT / REPLACE / REMOVE PACEMAKER  09/2004  . PPM GENERATOR CHANGEOUT N/A 06/19/2017   Procedure: PPM GENERATOR CHANGEOUT;  Surgeon: Thurmon Fair, MD;  [facility];  Service: Cardiovascular;  Laterality: N/A;  . THYROIDECTOMY  11/04/2013  . THYROIDECTOMY N/A 11/04/2013   Procedure: THYROIDECTOMY;  Surgeon: Ernestene Mention, MD;  Location: [facility] OR;  Service: General;  Laterality: N/A;    Family History  Problem Relation Age of Onset  . Thyroid disease Mother   . Heart disease Mother   . Thyroid disease Maternal Uncle   . Thyroid disease Maternal Grandmother   . Heart failure Maternal Grandmother   . Hypertension Maternal Grandmother   . Hypertension Maternal Grandfather     Social History   Socioeconomic History  . Marital status: Single    Spouse name: Not on file  . Number of children: Not on file  . Years of education: Not on file  . Highest education level: Not on file  Occupational History  . Not on file  Tobacco Use  . Smoking status: Current Every Day Smoker    Years: 0.50    Types: Cigars  . Smokeless tobacco:  Never Used  . Tobacco comment: 11/04/2013 "smoke 3 black N milds/day"  Substance and Sexual Activity  . Alcohol use: Yes    Comment: 11/04/2013 "mixed drinks or wine once a month  . Drug use: No    Types: Marijuana    Comment: none for 6 mos. since 03/2014  . Sexual activity: Yes    Birth control/protection: I.U.D.  Other Topics Concern  . Not on file  Social History Narrative   Epworth Sleepiness Scale = 12 (as of 10/22/2016)   Social Determinants of Health   Financial Resource Strain:   . Difficulty of Paying Living Expenses: Not on file  Food Insecurity:   . Worried About Programme researcher, broadcasting/film/video in the Last Year: Not on file  . Ran Out of Food in the Last Year: Not on file  Transportation Needs:   . Lack of Transportation (Medical): Not on file  . Lack of Transportation (Non-Medical): Not on file  Physical Activity:   . Days of Exercise per Week: Not on file  . Minutes of Exercise per Session: Not on file  Stress:   . Feeling of Stress : Not on file  Social Connections:   . Frequency of Communication with Friends and Family: Not on file  . Frequency of Social Gatherings  with Friends and Family: Not on file  . Attends Religious Services: Not on file  . Active Member of Clubs or Organizations: Not on file  . Attends Banker Meetings: Not on file  . Marital Status: Not on file  Intimate Partner Violence:   . Fear of Current or Ex-Partner: Not on file  . Emotionally Abused: Not on file  . Physically Abused: Not on file  . Sexually Abused: Not on file    Outpatient Medications Prior to Visit  Medication Sig Dispense Refill  . cyclobenzaprine (FLEXERIL) 10 MG tablet TAKE 1 TABLET BY MOUTH TWICE DAILY AS NEEDED FOR MUSCLE SPASM 60 tablet 2  . levothyroxine (SYNTHROID) 125 MCG tablet Take 1 tablet (125 mcg total) by mouth daily before breakfast. 90 tablet 1  . valACYclovir (VALTREX) 1000 MG tablet Take 1 tablet (1,000 mg total) by mouth 3 (three) times daily. (Patient  not taking: Reported on 04/27/2019) 21 tablet 0  . clindamycin (CLEOCIN) 2 % vaginal cream Place 1 Applicatorful vaginally at bedtime. 40 g 0  . tetracycline (SUMYCIN) 500 MG capsule Take 1 capsule (500 mg total) by mouth 2 (two) times daily. (Patient not taking: Reported on 04/29/2020) 60 capsule 3   No facility-administered medications prior to visit.    Allergies  Allergen Reactions  . Banana Anaphylaxis    Swelling of throat  . 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 Metronidazole     Oral sores   . Latex Rash  . Penicillins Other (See Comments)    Has patient had a PCN reaction causing immediate rash, facial/tongue/throat swelling, SOB or lightheadedness with hypotension: unknown Has patient had a PCN reaction causing severe rash involving mucus membranes or skin necrosis:unknownHas patient had a PCN reaction that required hospitalization unknown Has patient had a PCN reaction occurring within the last 10 years: unknown If all of the above answers are "NO", then may proceed with Cephalosporin use.     ROS Review of Systems  Cardiovascular: Positive for chest pain.       Daily left side sits does now move   Neurological: Positive for headaches.  Psychiatric/Behavioral: Positive for agitation and sleep disturbance.  All other systems reviewed and are negative.     Objective:    Physical Exam Vitals reviewed.  Constitutional:      Appearance: She is obese.  HENT:     Head: Normocephalic.     Right Ear: Tympanic membrane normal.     Left Ear: Tympanic membrane normal.     Nose: Nose normal.  Eyes:     Extraocular Movements: Extraocular movements intact.  Cardiovascular:     Rate and Rhythm: Normal rate and regular rhythm.  Pulmonary:     Effort: Pulmonary effort is normal.     Breath sounds: Normal breath sounds.  Abdominal:     General: Bowel sounds are normal. There is distension.     Palpations:  Abdomen is soft.  Musculoskeletal:        General: Normal range of motion.     Cervical back: Normal range of motion.  Skin:    General: Skin is warm and dry.  Neurological:     Mental Status: She is alert and oriented to person, place, and time.  Psychiatric:        Mood and Affect: Mood normal.        Behavior: Behavior normal.  Thought Content: Thought content normal.        Judgment: Judgment normal.     BP (!) 136/97 (BP Location: Right Arm, Patient Position: Sitting, Cuff Size: Large)   Pulse 93   Temp 97.9 F (36.6 C) (Temporal)   Ht 5\' 1"  (1.549 m)   Wt 173 lb 9.6 oz (78.7 kg)   SpO2 100%   BMI 32.80 kg/m  Wt Readings from Last 3 Encounters:  08/02/20 173 lb 9.6 oz (78.7 kg)  04/29/20 174 lb 6.4 oz (79.1 kg)  10/13/19 181 lb 3.2 oz (82.2 kg)     Health Maintenance Due  Topic Date Due  . Hepatitis C Screening  Never done  . COVID-19 Vaccine (1) Never done    There are no preventive care reminders to display for this patient.  Lab Results  Component Value Date   TSH 3.650 04/29/2020   Lab Results  Component Value Date   WBC 10.7 10/13/2019   HGB 13.7 10/13/2019   HCT 39.8 10/13/2019   MCV 94 10/13/2019   PLT 383 10/13/2019   Lab Results  Component Value Date   NA 141 10/13/2019   K 4.5 10/13/2019   CO2 23 10/13/2019   GLUCOSE 89 10/13/2019   BUN 9 10/13/2019   CREATININE 0.92 10/13/2019   BILITOT <0.2 10/13/2019   ALKPHOS 112 10/13/2019   AST 25 10/13/2019   ALT 28 10/13/2019   PROT 7.0 10/13/2019   ALBUMIN 4.5 10/13/2019   CALCIUM 9.2 10/13/2019   ANIONGAP 6 02/23/2017   GFR 101.94 08/06/2016   No results found for: CHOL No results found for: HDL No results found for: LDLCALC No results found for: TRIG No results found for: CHOLHDL No results found for: 08/08/2016    Assessment & Plan:  Lalia was seen today for hypertension.  Diagnoses and all orders for this visit:  Essential hypertension [facility] office 160/114  07/26/2020 Counseled on blood pressure goal of less than 130/80, low-sodium, DASH diet, medication compliance, 150 minutes of moderate intensity exercise per week. Discussed medication compliance, adverse effects. -     lisinopril-hydrochlorothiazide (ZESTORETIC) 20-25 MG tablet; Take 1 tablet by mouth daily.  Hypothyroidism, unspecified type -     TSH + free T4  Generalized anxiety disorder -     busPIRone (BUSPAR) 5 MG tablet; Take 1 tablet (5 mg total) by mouth 3 (three) times daily.   Follow-up: Return in about 6 weeks (around 09/13/2020) for BP/ medication .    09/15/2020, NP

## 2020-08-02 NOTE — Patient Instructions (Signed)

## 2020-08-03 LAB — TSH+FREE T4
Free T4: 1.07 ng/dL (ref 0.82–1.77)
TSH: 19.5 u[IU]/mL — ABNORMAL HIGH (ref 0.450–4.500)

## 2020-09-08 MED FILL — CYCLOBENZAPRINE 10 MG TAB: 10 | 30 days supply | Qty: 60 | Fill #2

## 2020-09-13 ENCOUNTER — Ambulatory Visit (INDEPENDENT_AMBULATORY_CARE_PROVIDER_SITE_OTHER): Payer: BC Managed Care – PPO | Admitting: Primary Care

## 2020-09-24 DIAGNOSIS — U071 COVID-19: Secondary | ICD-10-CM

## 2020-09-24 HISTORY — DX: COVID-19: U07.1

## 2020-09-28 ENCOUNTER — Ambulatory Visit (INDEPENDENT_AMBULATORY_CARE_PROVIDER_SITE_OTHER): Payer: No Typology Code available for payment source

## 2020-09-28 DIAGNOSIS — R55 Syncope and collapse: Secondary | ICD-10-CM

## 2020-09-28 LAB — CUP PACEART REMOTE DEVICE CHECK
Battery Remaining Longevity: 156 mo
Battery Remaining Percentage: 100 %
Brady Statistic RA Percent Paced: 0 %
Brady Statistic RV Percent Paced: 0 %
Date Time Interrogation Session: 20220105030100
Implantable Lead Implant Date: 20060119
Implantable Lead Implant Date: 20060119
Implantable Lead Location: 753859
Implantable Lead Location: 753860
Implantable Lead Model: 4456
Implantable Lead Model: 4469
Implantable Lead Serial Number: 439053
Implantable Lead Serial Number: 457210
Implantable Pulse Generator Implant Date: 20180926
Lead Channel Impedance Value: 421 Ohm
Lead Channel Impedance Value: 449 Ohm
Lead Channel Pacing Threshold Amplitude: 0.4 V
Lead Channel Pacing Threshold Amplitude: 0.8 V
Lead Channel Pacing Threshold Pulse Width: 0.4 ms
Lead Channel Pacing Threshold Pulse Width: 0.4 ms
Lead Channel Setting Pacing Amplitude: 2 V
Lead Channel Setting Pacing Amplitude: 2.5 V
Lead Channel Setting Pacing Pulse Width: 0.4 ms
Lead Channel Setting Sensing Sensitivity: 2.5 mV
Pulse Gen Serial Number: 789100

## 2020-10-08 ENCOUNTER — Emergency Department (HOSPITAL_COMMUNITY)
Admission: EM | Admit: 2020-10-08 | Discharge: 2020-10-09 | Disposition: A | Discharge status: Home / Self Care | Payer: BC Managed Care – PPO | Attending: Emergency Medicine | Admitting: Emergency Medicine

## 2020-10-08 ENCOUNTER — Emergency Department (HOSPITAL_COMMUNITY): Payer: BC Managed Care – PPO

## 2020-10-08 ENCOUNTER — Encounter (HOSPITAL_COMMUNITY): Payer: Self-pay | Admitting: Emergency Medicine

## 2020-10-08 ENCOUNTER — Other Ambulatory Visit: Payer: Self-pay

## 2020-10-08 DIAGNOSIS — R10817 Generalized abdominal tenderness: Secondary | ICD-10-CM | POA: Diagnosis not present

## 2020-10-08 DIAGNOSIS — R509 Fever, unspecified: Secondary | ICD-10-CM | POA: Diagnosis present

## 2020-10-08 DIAGNOSIS — U071 COVID-19: Secondary | ICD-10-CM | POA: Diagnosis not present

## 2020-10-08 DIAGNOSIS — Z79899 Other long term (current) drug therapy: Secondary | ICD-10-CM | POA: Diagnosis not present

## 2020-10-08 DIAGNOSIS — F17290 Nicotine dependence, other tobacco product, uncomplicated: Secondary | ICD-10-CM | POA: Insufficient documentation

## 2020-10-08 DIAGNOSIS — Z91040 Latex allergy status: Secondary | ICD-10-CM | POA: Insufficient documentation

## 2020-10-08 DIAGNOSIS — Z950 Presence of cardiac pacemaker: Secondary | ICD-10-CM | POA: Insufficient documentation

## 2020-10-08 DIAGNOSIS — Z20822 Contact with and (suspected) exposure to covid-19: Secondary | ICD-10-CM

## 2020-10-08 DIAGNOSIS — E039 Hypothyroidism, unspecified: Secondary | ICD-10-CM | POA: Insufficient documentation

## 2020-10-08 LAB — BASIC METABOLIC PANEL
Anion gap: 8 (ref 5–15)
BUN: 10 mg/dL (ref 6–20)
CO2: 23 mmol/L (ref 22–32)
Calcium: 8.9 mg/dL (ref 8.9–10.3)
Chloride: 103 mmol/L (ref 98–111)
Creatinine, Ser: 0.97 mg/dL (ref 0.44–1.00)
GFR, Estimated: >60 mL/min (ref >60)
Glucose, Bld: 96 mg/dL (ref 70–99)
Potassium: 3.9 mmol/L (ref 3.5–5.1)
Sodium: 134 mmol/L — ABNORMAL LOW (ref 135–145)

## 2020-10-08 LAB — CBC
HCT: 37.7 % (ref 36.0–46.0)
Hemoglobin: 12.9 g/dL (ref 12.0–15.0)
MCH: 32.3 pg (ref 26.0–34.0)
MCHC: 34.2 g/dL (ref 30.0–36.0)
MCV: 94.3 fL (ref 80.0–100.0)
Platelets: 311 10*3/uL (ref 150–400)
RBC: 4 MIL/uL (ref 3.87–5.11)
RDW: 11.7 % (ref 11.5–15.5)
WBC: 7.9 10*3/uL (ref 4.0–10.5)
nRBC: 0 % (ref 0.0–0.2)

## 2020-10-08 LAB — I-STAT BETA HCG BLOOD, ED (NOT ORDERABLE): I-stat hCG, quantitative: <5 m[IU]/mL (ref <5)

## 2020-10-08 LAB — TROPONIN I (HIGH SENSITIVITY): Troponin I (High Sensitivity): <2 ng/L (ref <18)

## 2020-10-08 MED ORDER — SODIUM CHLORIDE 0.9 % IV BOLUS
1000.0000 mL | Freq: Once | INTRAVENOUS | Status: AC
  Administered 2020-10-09: 1000 mL via INTRAVENOUS

## 2020-10-08 MED ORDER — ACETAMINOPHEN 325 MG PO TABS
650.0000 mg | ORAL_TABLET | Freq: Once | ORAL | Status: AC | PRN
  Administered 2020-10-08: 650 mg via ORAL
  Filled 2020-10-08: qty 2

## 2020-10-08 NOTE — ED Triage Notes (Signed)
Patient c/o chest pain and abdominal pain x1 week. Reports headaches and chills today.

## 2020-10-08 NOTE — ED Provider Notes (Signed)
[facility]   CSN: [id number] Arrival date & time: 10/08/20  1819     History Chief Complaint  Patient presents with  . Chest Pain    Jean DoeQuionna M Rowe is a 35 y.o. female.  The history is provided by the patient and medical records. No language interpreter was used.  Chest Pain    35 year old female with significant history of neurocardiogenic syncope status post [employer] pacemaker, anxiety, panic attack, IBS, presenting with cold symptoms.  Patient report for the past week she has had fever, chills, body aches, congestion, cough, decrease in appetite.  She now reports pain throughout her entire body including her abdomen and pain in her chest.  Pain is persistent, moderate in severity.  She noticed more pain to the right lower quadrant.  She does not complain of any loss of taste or smell, no dysuria or hematuria no vaginal bleeding or vaginal discharge.  She has not been vaccinated for COVID-19.  She denies any recent sick contact  Past Medical History:  Diagnosis Date  . Acne   . Anxiety    panic attacks  . Daily headache   . Heart murmur   . History of IBS   . History of kidney infection   . Hypothyroidism   . Neurocardiogenic syncope   . Neurocardiogenic syncope   . Orthopnoea   . 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 Panic attacks   . Symptomatic bradycardia   . Thyroid disease     Patient Active Problem List   Diagnosis Date Noted  . Fibromyalgia 10/16/2018  . Irritable bowel syndrome 10/16/2018  . Pacemaker battery depletion   . Paresthesia 09/01/2014  . 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 Neurocardiogenic syncope 06/23/2013  . Pacemaker - [employer] Insignia 2006 06/23/2013    Past Surgical History:  Procedure Laterality Date  . DILATION AND CURETTAGE OF UTERUS  2013  . INSERT / REPLACE / REMOVE PACEMAKER  09/2004  . PPM  GENERATOR CHANGEOUT N/A 06/19/2017   Procedure: PPM GENERATOR CHANGEOUT;  Surgeon: Thurmon Fairroitoru, Mihai, MD;  [facility]: [facility];  Service: Cardiovascular;  Laterality: N/A;  . THYROIDECTOMY  11/04/2013  . THYROIDECTOMY N/A 11/04/2013   Procedure: THYROIDECTOMY;  Surgeon: Ernestene MentionHaywood M Ingram, MD;  [facility];  Service: General;  Laterality: N/A;     OB History    Gravida  2   Para  2   Term      Preterm      AB      Living  2     SAB      IAB      Ectopic      Multiple      Live Births              Family History  Problem Relation Age of Onset  . Thyroid disease Mother   . Heart disease Mother   . Thyroid disease Maternal Uncle   . Thyroid disease Maternal Grandmother   . Heart failure Maternal Grandmother   . Hypertension Maternal Grandmother   . Hypertension Maternal Grandfather     Social History   Tobacco Use  . Smoking status: Current Every Day Smoker    Years: 0.50    Types: Cigars  . Smokeless tobacco: Never Used  . Tobacco comment: 11/04/2013 "smoke 3 black N milds/day"  Substance Use Topics  . Alcohol use: Yes    Comment:  11/04/2013 "mixed drinks or wine once a month  . Drug use: No    Types: Marijuana    Comment: none for 6 mos. since 03/2014    Home Medications Prior to Admission medications   Medication Sig Start Date End Date Taking? Authorizing Provider  busPIRone (BUSPAR) 5 MG tablet Take 1 tablet (5 mg total) by mouth 3 (three) times daily. 08/02/20   Grayce Sessions, NP  cyclobenzaprine (FLEXERIL) 10 MG tablet TAKE 1 TABLET BY MOUTH TWICE DAILY AS NEEDED FOR MUSCLE SPASM 06/27/20   Grayce Sessions, NP  levothyroxine (SYNTHROID) 125 MCG tablet TAKE 1 TABLET (125 MCG TOTAL) BY MOUTH DAILY BEFORE BREAKFAST. 08/02/20   Grayce Sessions, NP  lisinopril-hydrochlorothiazide (ZESTORETIC) 20-25 MG tablet Take 1 tablet by mouth daily. 08/02/20   Grayce Sessions, NP  valACYclovir (VALTREX) 1000 MG tablet Take 1 tablet (1,000 mg  total) by mouth 3 (three) times daily. Patient not taking: Reported on 04/27/2019 12/23/18   Eustace Moore, MD    Allergies    Banana, Other, Metronidazole, Latex, and Penicillins  Review of Systems   Review of Systems  Cardiovascular: Positive for chest pain.  All other systems reviewed and are negative.   Physical Exam Updated Vital Signs BP 125/88 (BP Location: Right Arm)   Pulse (!) 108   Temp (!) 101.6 F (38.7 C) (Oral)   Resp (!) 33   Ht 5\' 1"  (1.549 m)   Wt 81.6 kg   SpO2 100%   BMI 34.01 kg/m   Physical Exam Vitals and nursing note reviewed.  Constitutional:      General: She is not in acute distress.    Appearance: She is well-developed and well-nourished.  HENT:     Head: Atraumatic.  Eyes:     Conjunctiva/sclera: Conjunctivae normal.  Cardiovascular:     Rate and Rhythm: Tachycardia present.     Pulses: Normal pulses.     Heart sounds: Normal heart sounds.  Pulmonary:     Effort: Pulmonary effort is normal.     Breath sounds: Normal breath sounds.  Abdominal:     Palpations: Abdomen is soft.     Tenderness: There is abdominal tenderness (Mild diffuse abdominal tenderness no guarding rebound tenderness).  Musculoskeletal:     Cervical back: Neck supple. No rigidity.  Skin:    General: Skin is warm.     Findings: No rash.  Neurological:     Mental Status: She is alert and oriented to person, place, and time.  Psychiatric:        Mood and Affect: Mood and affect and mood normal.     ED Results / Procedures / Treatments   Labs (all labs ordered are listed, but only abnormal results are displayed) Labs Reviewed  BASIC METABOLIC PANEL - Abnormal; Notable for the following components:      Result Value   Sodium 134 (*)    All other components within normal limits  SARS CORONAVIRUS 2 (TAT 6-24 HRS)  CBC  I-STAT BETA HCG BLOOD, ED ([facility], [facility], [facility] ONLY)  I-STAT BETA HCG BLOOD, ED (NOT ORDERABLE)  TROPONIN I (HIGH SENSITIVITY)  TROPONIN I (HIGH  SENSITIVITY)    EKG None  Radiology DG Chest 2 View  Result Date: 10/08/2020 CLINICAL DATA:  Patient c/o chest pain and abdominal pain x1 week. Reports headaches and chills today. EXAM: CHEST - 2 VIEW COMPARISON:  Chest radiograph 06/03/2015 FINDINGS: Stable cardiomediastinal contours with normal heart size. Left chest pacer remains in  place. The lungs are clear. No pneumothorax or pleural effusion. No acute finding in the visualized skeleton. IMPRESSION: No acute cardiopulmonary process. Electronically Signed   By: Emmaline Kluver M.D.   On: 10/08/2020 19:12   CT ABDOMEN PELVIS W CONTRAST  Result Date: 10/09/2020 CLINICAL DATA:  Right lower quadrant abdominal pain. Chest pain and abdominal pain. EXAM: CT ABDOMEN AND PELVIS WITH CONTRAST TECHNIQUE: Multidetector CT imaging of the abdomen and pelvis was performed using the standard protocol following bolus administration of intravenous contrast. CONTRAST:  OMNIPAQUE IOHEXOL 300 MG/ML  SOLN COMPARISON:  CT dated February 23, 2017. FINDINGS: Lower chest: The lung bases are clear. The heart size is normal. Hepatobiliary: The liver is normal. Normal gallbladder.There is no biliary ductal dilation. Pancreas: Normal contours without ductal dilatation. No peripancreatic fluid collection. Spleen: Unremarkable. Adrenals/Urinary Tract: --Adrenal glands: Unremarkable. --Right kidney/ureter: No hydronephrosis or radiopaque kidney stones. --Left kidney/ureter: No hydronephrosis or radiopaque kidney stones. --Urinary bladder: Unremarkable. Stomach/Bowel: --Stomach/Duodenum: No hiatal hernia or other gastric abnormality. Normal duodenal course and caliber. --Small bowel: Unremarkable. --Colon: Unremarkable. --Appendix: Normal. Vascular/Lymphatic: Normal course and caliber of the major abdominal vessels. --No retroperitoneal lymphadenopathy. --No mesenteric lymphadenopathy. --No pelvic or inguinal lymphadenopathy. Reproductive: There is an IUD in place. Other: No  ascites or free air. There is a small fat containing umbilical hernia. Musculoskeletal. No acute displaced fractures. IMPRESSION: 1. No CT findings to explain the patient's abdominal pain. 2. Normal appendix. 3. Small fat containing umbilical hernia. Electronically Signed   By: Katherine Mantle M.D.   On: 10/09/2020 01:17    Procedures Procedures (including critical care time)  Medications Ordered in ED Medications  acetaminophen (TYLENOL) tablet 650 mg (650 mg Oral Given 10/08/20 1911)  sodium chloride 0.9 % bolus 1,000 mL (1,000 mLs Intravenous New Bag/Given (Non-Interop) 10/09/20 0020)  morphine 4 MG/ML injection 4 mg (4 mg Intravenous Given 10/09/20 0021)  iohexol (OMNIPAQUE) 300 MG/ML solution 100 mL (100 mLs Intravenous Contrast Given 10/09/20 0103)    ED Course  I have reviewed the triage vital signs and the nursing notes.  Pertinent labs & imaging results that were available during my care of the patient were reviewed by me and considered in my medical decision making (see chart for details).    MDM Rules/Calculators/A&P                          BP 116/72   Pulse 88   Temp 98.1 F (36.7 C) (Oral)   Resp 16   Ht 5\' 1"  (1.549 m)   Wt 81.6 kg   SpO2 100%   BMI 34.01 kg/m   Final Clinical Impression(s) / ED Diagnoses Final diagnoses:  Suspected COVID-19 virus infection    Rx / DC Orders ED Discharge Orders    None     12:01 AM Patient here with fever chills body aches and cold symptoms.  Symptoms likely suggestive of COVID infection.  COVID test is currently pending.  Chest x-ray obtained showed no acute abnormalities.  Her labs are reassuring.  Patient have a voiced concern for potential appendicitis and complaining of pain to the right lower quadrant.  Minimal tenderness to right lower quadrant on exam.  Out of abundance of precaution and per patient's concern, an abdominal pelvis CT scan ordered.  1:36 AM CT scan of the abdomen pelvis without any acute finding.   Normal appendix.  Small fat-containing umbilical hernia noted.  As mentioned earlier, symptoms likely related to  COVID infection.  COVID testing currently pending.  Patient otherwise well-appearing, no hypoxia, stable for discharge.  Chest x-ray unremarkable.  Jean Rowe was evaluated in Emergency Department on 10/09/2020 for the symptoms described in the history of present illness. She was evaluated in the context of the global COVID-19 pandemic, which necessitated consideration that the patient might be at risk for infection with the SARS-CoV-2 virus that causes COVID-19. Institutional protocols and algorithms that pertain to the evaluation of patients at risk for COVID-19 are in a state of rapid change based on information released by regulatory bodies including the CDC and federal and state organizations. These policies and algorithms were followed during the patient's care in the ED.    Fayrene Helper, PA-C 10/09/20 0141    Nira Conn, MD 10/09/20 [phone]

## 2020-10-09 ENCOUNTER — Emergency Department (HOSPITAL_COMMUNITY): Payer: BC Managed Care – PPO

## 2020-10-09 ENCOUNTER — Encounter (HOSPITAL_COMMUNITY): Payer: Self-pay

## 2020-10-09 LAB — TROPONIN I (HIGH SENSITIVITY): Troponin I (High Sensitivity): <2 ng/L (ref <18)

## 2020-10-09 LAB — SARS CORONAVIRUS 2 (TAT 6-24 HRS): SARS Coronavirus 2: POSITIVE — AB

## 2020-10-09 MED ORDER — MORPHINE SULFATE (PF) 4 MG/ML IV SOLN
4.0000 mg | Freq: Once | INTRAVENOUS | Status: AC
  Administered 2020-10-09: 4 mg via INTRAVENOUS
  Filled 2020-10-09: qty 1

## 2020-10-09 MED ORDER — IOHEXOL 300 MG/ML  SOLN
100.0000 mL | Freq: Once | INTRAMUSCULAR | Status: AC | PRN
  Administered 2020-10-09: 100 mL via INTRAVENOUS

## 2020-10-09 NOTE — Discharge Instructions (Addendum)
Your symptoms likely due to a COVID infection.  Your COVID test is currently pending, please check result through MyChart, link below.  If test positive, follow instruction as provided  Recommendations for at home COVID-19 symptoms management:  Please continue isolation at home. Call [phone] to see whether you might be eligible for therapeutic antibody infusions (leave your name and they will call you back).  If have acute worsening of symptoms please go to ER/urgent care for further evaluation. Check pulse oximetry and if below 90-92% please go to ER. The following supplements MAY help:  Vitamin C 500mg  twice a day and Quercetin 250-500 mg twice a day Vitamin D3 2000 - 4000 u/day B Complex vitamins Zinc 75-100 mg/day Melatonin 6-10 mg at night (the optimal dose is unknown) Aspirin 81mg /day (if no history of bleeding issues)

## 2020-10-10 ENCOUNTER — Encounter: Payer: Self-pay | Admitting: Nurse Practitioner

## 2020-10-10 DIAGNOSIS — U071 COVID-19: Secondary | ICD-10-CM | POA: Insufficient documentation

## 2020-10-12 NOTE — Progress Notes (Signed)
Remote pacemaker transmission.   

## 2020-10-20 ENCOUNTER — Other Ambulatory Visit (INDEPENDENT_AMBULATORY_CARE_PROVIDER_SITE_OTHER): Payer: Self-pay | Admitting: Primary Care

## 2020-10-20 DIAGNOSIS — M797 Fibromyalgia: Secondary | ICD-10-CM

## 2020-10-20 MED FILL — LISINOPRIL-HYDROCHLOROTHIAZ: 20-25 | 30 days supply | Qty: 30 | Fill #1

## 2020-10-20 MED FILL — LEVOTHYROXINE SODIUM 125 MC: 125 | 30 days supply | Qty: 30 | Fill #1

## 2020-10-20 NOTE — Telephone Encounter (Signed)
Request sent to PCP to refill if appropriate.  

## 2020-10-23 ENCOUNTER — Other Ambulatory Visit (INDEPENDENT_AMBULATORY_CARE_PROVIDER_SITE_OTHER): Payer: Self-pay | Admitting: Primary Care

## 2020-10-24 MED FILL — CYCLOBENZAPRINE 10 MG TAB: 10 | 30 days supply | Qty: 60 | Fill #0

## 2020-12-01 ENCOUNTER — Other Ambulatory Visit (INDEPENDENT_AMBULATORY_CARE_PROVIDER_SITE_OTHER): Payer: Self-pay | Admitting: Primary Care

## 2020-12-01 DIAGNOSIS — F411 Generalized anxiety disorder: Secondary | ICD-10-CM

## 2020-12-01 DIAGNOSIS — I10 Essential (primary) hypertension: Secondary | ICD-10-CM

## 2020-12-01 DIAGNOSIS — M797 Fibromyalgia: Secondary | ICD-10-CM

## 2020-12-01 MED ORDER — LISINOPRIL-HYDROCHLOROTHIAZIDE 20-25 MG PO TABS: 1.0000 | ORAL_TABLET | Freq: Every day | ORAL | 0 refills | Status: DC

## 2020-12-01 MED ORDER — LEVOTHYROXINE SODIUM 125 MCG PO TABS: 125.0000 ug | ORAL_TABLET | Freq: Every day | ORAL | 0 refills | Status: DC

## 2020-12-01 MED ORDER — BUSPIRONE HCL 5 MG PO TABS: 5.0000 mg | ORAL_TABLET | Freq: Three times a day (TID) | ORAL | 0 refills | Status: DC

## 2020-12-01 NOTE — Telephone Encounter (Signed)
Medication Refill - Medication:    busPIRone (BUSPAR) 5 MG tablet   levothyroxine (SYNTHROID) 125 MCG tablet   lisinopril-hydrochlorothiazide (ZESTORETIC) 20-25 MG tablet    Has the patient contacted their pharmacy? Yes.   (Agent: If no, request that the patient contact the pharmacy for the refill.) (Agent: If yes, when and what did the pharmacy advise?)  Preferred Pharmacy (with phone number or street name):   [facility] - [place], [place] - [address]  [address] [place] [address]  Phone: [phone] Fax: [phone]    Agent: Please be advised that RX refills may take up to 3 business days. We ask that you follow-up with your pharmacy.

## 2020-12-01 NOTE — Telephone Encounter (Signed)
Future visit in 1 week . TSH due

## 2020-12-02 ENCOUNTER — Other Ambulatory Visit (INDEPENDENT_AMBULATORY_CARE_PROVIDER_SITE_OTHER): Payer: Self-pay | Admitting: Primary Care

## 2020-12-02 DIAGNOSIS — M797 Fibromyalgia: Secondary | ICD-10-CM

## 2020-12-02 MED FILL — busPIRone HCL 5 MG TABS: 5 | 30 days supply | Qty: 90 | Fill #0

## 2020-12-02 MED FILL — LISINOPRIL-HYDROCHLOROTHIAZ: 20-25 | 90 days supply | Qty: 90 | Fill #0

## 2020-12-02 MED FILL — LEVOTHYROXINE SODIUM 125 MC: 125 | 30 days supply | Qty: 30 | Fill #0

## 2020-12-02 NOTE — Telephone Encounter (Signed)
Patient needs refill of cyclobenzaprine

## 2020-12-02 NOTE — Telephone Encounter (Signed)
Requested medication (s) are due for refill today: yes  Requested medication (s) are on the active medication list: yes  Last refill:  10/24/2020  Future visit scheduled:yes  Notes to clinic: this refill cannot be delegated    Requested Prescriptions  Pending Prescriptions Disp Refills   cyclobenzaprine (FLEXERIL) 10 MG tablet [Pharmacy Med Name: CYCLOBENZAPRINE 10 MG TAB 10 Tablet] 60 tablet 0    Sig: TAKE 1 TABLET BY MOUTH TWICE DAILY AS NEEDED FOR MUSCLE SPASM      Not Delegated - Analgesics:  Muscle Relaxants Failed - 12/02/2020  2:58 PM      Failed - This refill cannot be delegated      Passed - Valid encounter within last 6 months    Recent Outpatient Visits           4 months ago Essential hypertension   [facility] Grayce Sessions, NP   7 months ago Burning with urination   [facility] Grayce Sessions, NP   1 year ago Goiter, nontoxic, multinodular   [facility] Grayce Sessions, NP   1 year ago Acne comedone   [facility] Grayce Sessions, NP   1 year ago Trigeminal herpes zoster   [facility] Grayce Sessions, NP       Future Appointments             In 1 week Randa Evens Kinnie Scales, NP [facility]

## 2020-12-06 ENCOUNTER — Other Ambulatory Visit (INDEPENDENT_AMBULATORY_CARE_PROVIDER_SITE_OTHER): Payer: Self-pay | Admitting: Family Medicine

## 2020-12-07 ENCOUNTER — Other Ambulatory Visit (INDEPENDENT_AMBULATORY_CARE_PROVIDER_SITE_OTHER): Payer: Self-pay | Admitting: Primary Care

## 2020-12-07 MED ORDER — LEVOTHYROXINE SODIUM 125 MCG PO TABS: 125.0000 ug | ORAL_TABLET | Freq: Every day | ORAL | 0 refills | Status: DC

## 2020-12-07 MED ORDER — BUSPIRONE HCL 5 MG PO TABS: 5.0000 mg | ORAL_TABLET | Freq: Three times a day (TID) | ORAL | 0 refills | Status: DC

## 2020-12-07 MED ORDER — CYCLOBENZAPRINE HCL 10 MG PO TABS
ORAL_TABLET | ORAL | 0 refills | Status: DC
Start: 2020-12-07 — End: 2020-12-07

## 2020-12-07 MED ORDER — CYCLOBENZAPRINE HCL 10 MG PO TABS: ORAL_TABLET | ORAL | 0 refills | Status: DC

## 2020-12-07 MED ORDER — LISINOPRIL-HYDROCHLOROTHIAZIDE 20-25 MG PO TABS: 1.0000 | ORAL_TABLET | Freq: Every day | ORAL | 0 refills | Status: DC

## 2020-12-14 ENCOUNTER — Ambulatory Visit (INDEPENDENT_AMBULATORY_CARE_PROVIDER_SITE_OTHER): Payer: BC Managed Care – PPO | Admitting: Primary Care

## 2020-12-24 ENCOUNTER — Other Ambulatory Visit: Payer: Self-pay

## 2020-12-26 ENCOUNTER — Ambulatory Visit (INDEPENDENT_AMBULATORY_CARE_PROVIDER_SITE_OTHER): Payer: BC Managed Care – PPO | Admitting: Primary Care

## 2020-12-26 ENCOUNTER — Other Ambulatory Visit: Payer: Self-pay

## 2020-12-26 ENCOUNTER — Encounter (INDEPENDENT_AMBULATORY_CARE_PROVIDER_SITE_OTHER): Payer: Self-pay | Admitting: Primary Care

## 2020-12-26 VITALS — BP 138/97 | HR 88 | Temp 97.3°F | Ht 61.0 in | Wt 183.4 lb

## 2020-12-26 DIAGNOSIS — M62830 Muscle spasm of back: Secondary | ICD-10-CM

## 2020-12-26 DIAGNOSIS — I10 Essential (primary) hypertension: Secondary | ICD-10-CM | POA: Diagnosis not present

## 2020-12-26 DIAGNOSIS — E890 Postprocedural hypothyroidism: Secondary | ICD-10-CM

## 2020-12-26 MED ORDER — LISINOPRIL-HYDROCHLOROTHIAZIDE 20-25 MG PO TABS
1.0000 | ORAL_TABLET | Freq: Every day | ORAL | 1 refills | Status: DC
  Filled 2020-12-26: qty 90, 90d supply, fill #0
  Filled 2021-02-06: qty 30, 30d supply, fill #0

## 2020-12-26 MED ORDER — AMLODIPINE BESYLATE 5 MG PO TABS
5.0000 mg | ORAL_TABLET | Freq: Every day | ORAL | 3 refills | Status: DC
  Filled 2020-12-26 – 2021-01-04 (×2): qty 90, 90d supply, fill #0
  Filled 2021-02-06: qty 30, 30d supply, fill #0

## 2020-12-26 MED ORDER — CELECOXIB 100 MG PO CAPS
100.0000 mg | ORAL_CAPSULE | Freq: Two times a day (BID) | ORAL | 1 refills | Status: DC
  Filled 2020-12-26 – 2021-01-04 (×2): qty 180, 90d supply, fill #0
  Filled 2021-02-06: qty 60, 30d supply, fill #0
  Filled 2021-03-20 – 2021-03-29 (×2): qty 60, 30d supply, fill #1
  Filled 2021-05-26: qty 60, 30d supply, fill #2

## 2020-12-26 NOTE — Progress Notes (Signed)
Established Patient Office Visit  Subjective:  Patient ID: Jean Rowe, female    DOB: [birth year]  Age: 35 y.o. MRN: [id number]  CC:  Chief Complaint  Patient presents with  . Medication Management    HPI [name] is a 35 year old obese female who presents for medication management for thyroids, hypertension- Denies shortness of breath, headaches, chest pain or lower extremity edema, sudden onset, vision changes, unilateral weakness, dizziness, paresthesias and chronic back pain/muscle spasm.   Past Medical History:  Diagnosis Date  . Acne   . Anxiety    panic attacks  . COVID-19 virus infection 09/2020  . Daily headache   . Heart murmur   . History of IBS   . History of kidney infection   . Hypothyroidism   . Morbid obesity (HCC)   . Neurocardiogenic syncope   . Neurocardiogenic syncope   . Orthopnoea   . 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 Panic attacks   . Symptomatic bradycardia   . Thyroid disease     Past Surgical History:  Procedure Laterality Date  . DILATION AND CURETTAGE OF UTERUS  2013  . INSERT / REPLACE / REMOVE PACEMAKER  09/2004  . PPM GENERATOR CHANGEOUT N/A 06/19/2017   Procedure: PPM GENERATOR CHANGEOUT;  Surgeon: Thurmon Fair, MD;  Location: [facility];  Service: Cardiovascular;  Laterality: N/A;  . THYROIDECTOMY  11/04/2013  . THYROIDECTOMY N/A 11/04/2013   Procedure: THYROIDECTOMY;  Surgeon: Ernestene Mention, MD;  Location: [facility] OR;  Service: General;  Laterality: N/A;    Family History  Problem Relation Age of Onset  . Thyroid disease Mother   . Heart disease Mother   . Thyroid disease Maternal Uncle   . Thyroid disease Maternal Grandmother   . Heart failure Maternal Grandmother   . Hypertension Maternal Grandmother   . Hypertension Maternal Grandfather     Social History   Socioeconomic History  . Marital status: Single    Spouse name: Not on file  . Number of children: Not on file  . Years of  education: Not on file  . Highest education level: Not on file  Occupational History  . Not on file  Tobacco Use  . Smoking status: Current Every Day Smoker    Years: 0.50    Types: Cigars  . Smokeless tobacco: Never Used  . Tobacco comment: 11/04/2013 "smoke 3 black N milds/day"  Substance and Sexual Activity  . Alcohol use: Yes    Comment: 11/04/2013 "mixed drinks or wine once a month  . Drug use: No    Types: Marijuana    Comment: none for 6 mos. since 03/2014  . Sexual activity: Yes    Birth control/protection: I.U.D.  Other Topics Concern  . Not on file  Social History Narrative   Epworth Sleepiness Scale = 12 (as of 10/22/2016)   Social Determinants of Health   Financial Resource Strain: Not on file  Food Insecurity: Not on file  Transportation Needs: Not on file  Physical Activity: Not on file  Stress: Not on file  Social Connections: Not on file  Intimate Partner Violence: Not on file    Outpatient Medications Prior to Visit  Medication Sig Dispense Refill  . cyclobenzaprine (FLEXERIL) 10 MG tablet TAKE 1 TABLET BY MOUTH TWICE DAILY AS NEEDED FOR MUSCLE SPASM NEEDS APPT. 30 tablet 0  . levothyroxine (SYNTHROID) 125 MCG tablet TAKE 1 TABLET (125 MCG TOTAL) BY MOUTH DAILY BEFORE BREAKFAST.  30 tablet 0  . busPIRone (BUSPAR) 5 MG tablet TAKE 1 TABLET (5 MG TOTAL) BY MOUTH 3 (THREE) TIMES DAILY. (Patient not taking: Reported on 12/26/2020) 90 tablet 0  . cyclobenzaprine (FLEXERIL) 10 MG tablet TAKE 1 TABLET BY MOUTH TWICE DAILY AS NEEDED FOR MUSCLE SPASM 60 tablet 0  . lisinopril-hydrochlorothiazide (ZESTORETIC) 20-25 MG tablet TAKE 1 TABLET BY MOUTH DAILY. 30 tablet 0  . valACYclovir (VALTREX) 1000 MG tablet Take 1 tablet (1,000 mg total) by mouth 3 (three) times daily. (Patient not taking: No sig reported) 21 tablet 0   No facility-administered medications prior to visit.    Allergies  Allergen Reactions  . Banana Anaphylaxis    Swelling of throat  . Other Other (See  Comments)    Pacemaker put in place upper left thorax in 2006, unknown manufacturer   . Metronidazole     Oral sores   . Latex Rash  . Penicillins Other (See Comments)    Has patient had a PCN reaction causing immediate rash, facial/tongue/throat swelling, SOB or lightheadedness with hypotension: unknown Has patient had a PCN reaction causing severe rash involving mucus membranes or skin necrosis:unknownHas patient had a PCN reaction that required hospitalization unknown Has patient had a PCN reaction occurring within the last 10 years: unknown If all of the above answers are "NO", then may proceed with Cephalosporin use.     ROS Review of Systems  Musculoskeletal: Positive for back pain.       Muscle spasm   All other systems reviewed and are negative.   Objective:        Physical Exam Vitals reviewed.  Constitutional:      Appearance: She is obese.  HENT:     Head: Normocephalic.     Right Ear: External ear normal.     Left Ear: External ear normal.  Eyes:     Extraocular Movements: Extraocular movements intact.  Cardiovascular:     Rate and Rhythm: Normal rate and regular rhythm.  Pulmonary:     Effort: Pulmonary effort is normal.     Breath sounds: Normal breath sounds.  Abdominal:     General: Bowel sounds are normal. There is distension.     Palpations: Abdomen is soft.  Musculoskeletal:        General: Normal range of motion.     Cervical back: Normal range of motion and neck supple.  Skin:    General: Skin is warm and dry.  Neurological:     Mental Status: She is alert and oriented to person, place, and time.  Psychiatric:        Mood and Affect: Mood normal.        Behavior: Behavior normal.        Thought Content: Thought content normal.        Judgment: Judgment normal.    BP (!) 138/97 (BP Location: Right Arm, Patient Position: Sitting, Cuff Size: Normal)   Pulse 88   Temp (!) 97.3 F (36.3 C) (Temporal)   Ht 5\' 1"  (1.549 m)   Wt 183 lb 6.4 oz  (83.2 kg)   SpO2 99%   BMI 34.65 kg/m  Wt Readings from Last 3 Encounters:  12/26/20 183 lb 6.4 oz (83.2 kg)  10/08/20 180 lb (81.6 kg)  08/02/20 173 lb 9.6 oz (78.7 kg)     Health Maintenance Due  Topic Date Due  . Hepatitis C Screening  Never done  . COVID-19 Vaccine (1) Never done    There  are no preventive care reminders to display for this patient.  Lab Results  Component Value Date   TSH 19.500 (H) 08/02/2020   Lab Results  Component Value Date   WBC 7.9 10/08/2020   HGB 12.9 10/08/2020   HCT 37.7 10/08/2020   MCV 94.3 10/08/2020   PLT 311 10/08/2020   Lab Results  Component Value Date   NA 134 (L) 10/08/2020   K 3.9 10/08/2020   CO2 23 10/08/2020   GLUCOSE 96 10/08/2020   BUN 10 10/08/2020   CREATININE 0.97 10/08/2020   BILITOT <0.2 10/13/2019   ALKPHOS 112 10/13/2019   AST 25 10/13/2019   ALT 28 10/13/2019   PROT 7.0 10/13/2019   ALBUMIN 4.5 10/13/2019   CALCIUM 8.9 10/08/2020   ANIONGAP 8 10/08/2020   GFR 101.94 08/06/2016   No results found for: CHOL No results found for: HDL No results found for: LDLCALC No results found for: TRIG No results found for: CHOLHDL No results found for: GNFA2Z    Assessment & Plan:  Mai was seen today for medication management.  Diagnoses and all orders for this visit:  Postsurgical hypothyroidism -     TSH + free T4  Essential hypertension Counseled on blood pressure goal of less than 130/80, low-sodium, DASH diet, medication compliance, 150 minutes of moderate intensity exercise per week. Discussed medication compliance, adverse effects. -     lisinopril-hydrochlorothiazide (ZESTORETIC) 20-25 MG tablet; TAKE 1 TABLET BY MOUTH DAILY. -     amLODipine (NORVASC) 5 MG tablet; Take 1 tablet (5 mg total) by mouth daily.  Muscle spasm of back -     celecoxib (CELEBREX) 100 MG capsule; Take 1 capsule (100 mg total) by mouth 2 (two) times daily.  BACK PAIN  Location: lumbar/sacral Quality: shooting Onset:  sudden Worse with: bending       Better with: standing  Radiation: no Trauma: no Best sitting/standing/leaning forward: yes Red Flags Fecal/urinary incontinence: no  Numbness/Weakness: no  Fever/chills/sweats: no  Night pain: no  Unexplained weight loss: no  No relief with bedrest: yes  h/o cancer/immunosuppression: no  IV drug use: no  PMH of osteoporosis or chronic steroid use: no   Meds ordered this encounter  Medications  . lisinopril-hydrochlorothiazide (ZESTORETIC) 20-25 MG tablet    Sig: TAKE 1 TABLET BY MOUTH DAILY.    Dispense:  90 tablet    Refill:  1  . celecoxib (CELEBREX) 100 MG capsule    Sig: Take 1 capsule (100 mg total) by mouth 2 (two) times daily.    Dispense:  180 capsule    Refill:  1  . amLODipine (NORVASC) 5 MG tablet    Sig: Take 1 tablet (5 mg total) by mouth daily.    Dispense:  90 tablet    Refill:  3    Follow-up: Return in about 6 weeks (around 02/06/2021) for thyroids/ BP .    Grayce Sessions, NP

## 2020-12-26 NOTE — Patient Instructions (Signed)
Managing Your Hypertension Hypertension, also called high blood pressure, is when the force of the blood pressing against the walls of the arteries is too strong. Arteries are blood vessels that carry blood from your heart throughout your body. Hypertension forces the heart to work harder to pump blood and may cause the arteries to become narrow or stiff. Understanding blood pressure readings Your personal target blood pressure may vary depending on your medical conditions, your age, and other factors. A blood pressure reading includes a higher number over a lower number. Ideally, your blood pressure should be below 120/80. You should know that:  The first, or top, number is called the systolic pressure. It is a measure of the pressure in your arteries as your heart beats.  The second, or bottom number, is called the diastolic pressure. It is a measure of the pressure in your arteries as the heart relaxes. Blood pressure is classified into four stages. Based on your blood pressure reading, your health care provider may use the following stages to determine what type of treatment you need, if any. Systolic pressure and diastolic pressure are measured in a unit called mmHg. Normal  Systolic pressure: below 120.  Diastolic pressure: below 80. Elevated  Systolic pressure: 120-129.  Diastolic pressure: below 80. Hypertension stage 1  Systolic pressure: 130-139.  Diastolic pressure: 80-89. Hypertension stage 2  Systolic pressure: 140 or above.  Diastolic pressure: 90 or above. How can this condition affect me? Managing your hypertension is an important responsibility. Over time, hypertension can damage the arteries and decrease blood flow to important parts of the body, including the brain, heart, and kidneys. Having untreated or uncontrolled hypertension can lead to:  A heart attack.  A stroke.  A weakened blood vessel (aneurysm).  Heart failure.  Kidney damage.  Eye  damage.  Metabolic syndrome.  Memory and concentration problems.  Vascular dementia. What actions can I take to manage this condition? Hypertension can be managed by making lifestyle changes and possibly by taking medicines. Your health care provider will help you make a plan to bring your blood pressure within a normal range. Nutrition  Eat a diet that is high in fiber and potassium, and low in salt (sodium), added sugar, and fat. An example eating plan is called the Dietary Approaches to Stop Hypertension (DASH) diet. To eat this way: ? Eat plenty of fresh fruits and vegetables. Try to fill one-half of your plate at each meal with fruits and vegetables. ? Eat whole grains, such as whole-wheat pasta, brown rice, or whole-grain bread. Fill about one-fourth of your plate with whole grains. ? Eat low-fat dairy products. ? Avoid fatty cuts of meat, processed or cured meats, and poultry with skin. Fill about one-fourth of your plate with lean proteins such as fish, chicken without skin, beans, eggs, and tofu. ? Avoid pre-made and processed foods. These tend to be higher in sodium, added sugar, and fat.  Reduce your daily sodium intake. Most people with hypertension should eat less than 1,500 mg of sodium a day.   Lifestyle  Work with your health care provider to maintain a healthy body weight or to lose weight. Ask what an ideal weight is for you.  Get at least 30 minutes of exercise that causes your heart to beat faster (aerobic exercise) most days of the week. Activities may include walking, swimming, or biking.  Include exercise to strengthen your muscles (resistance exercise), such as weight lifting, as part of your weekly exercise routine. Try   to do these types of exercises for 30 minutes at least 3 days a week.  Do not use any products that contain nicotine or tobacco, such as cigarettes, e-cigarettes, and chewing tobacco. If you need help quitting, ask your health care  provider.  Control any long-term (chronic) conditions you have, such as high cholesterol or diabetes.  Identify your sources of stress and find ways to manage stress. This may include meditation, deep breathing, or making time for fun activities.   Alcohol use  Do not drink alcohol if: ? Your health care provider tells you not to drink. ? You are pregnant, may be pregnant, or are planning to become pregnant.  If you drink alcohol: ? Limit how much you use to:  0-1 drink a day for women.  0-2 drinks a day for men. ? Be aware of how much alcohol is in your drink. In the [place] one drink equals one 12 oz bottle of beer (355 mL), one 5 oz glass of wine (148 mL), or one 1 oz glass of hard liquor (44 mL). Medicines Your health care provider may prescribe medicine if lifestyle changes are not enough to get your blood pressure under control and if:  Your systolic blood pressure is 130 or higher.  Your diastolic blood pressure is 80 or higher. Take medicines only as told by your health care provider. Follow the directions carefully. Blood pressure medicines must be taken as told by your health care provider. The medicine does not work as well when you skip doses. Skipping doses also puts you at risk for problems. Monitoring Before you monitor your blood pressure:  Do not smoke, drink caffeinated beverages, or exercise within 30 minutes before taking a measurement.  Use the bathroom and empty your bladder (urinate).  Sit quietly for at least 5 minutes before taking measurements. Monitor your blood pressure at home as told by your health care provider. To do this:  Sit with your back straight and supported.  Place your feet flat on the floor. Do not cross your legs.  Support your arm on a flat surface, such as a table. Make sure your upper arm is at heart level.  Each time you measure, take two or three readings one minute apart and record the results. You may also need to have your  blood pressure checked regularly by your health care provider.   General information  Talk with your health care provider about your diet, exercise habits, and other lifestyle factors that may be contributing to hypertension.  Review all the medicines you take with your health care provider because there may be side effects or interactions.  Keep all visits as told by your health care provider. Your health care provider can help you create and adjust your plan for managing your high blood pressure. Where to find more information  National Heart, Lung, and Blood Institute: www.nhlbi.nih.gov  American Heart Association: www.heart.org Contact a health care provider if:  You think you are having a reaction to medicines you have taken.  You have repeated (recurrent) headaches.  You feel dizzy.  You have swelling in your ankles.  You have trouble with your vision. Get help right away if:  You develop a severe headache or confusion.  You have unusual weakness or numbness, or you feel faint.  You have severe pain in your chest or abdomen.  You vomit repeatedly.  You have trouble breathing. These symptoms may represent a serious problem that is an emergency. Do not wait   to see if the symptoms will go away. Get medical help right away. Call your local emergency services (911 in the [place] Do not drive yourself to the hospital. Summary  Hypertension is when the force of blood pumping through your arteries is too strong. If this condition is not controlled, it may put you at risk for serious complications.  Your personal target blood pressure may vary depending on your medical conditions, your age, and other factors. For most people, a normal blood pressure is less than 120/80.  Hypertension is managed by lifestyle changes, medicines, or both.  Lifestyle changes to help manage hypertension include losing weight, eating a healthy, low-sodium diet, exercising more, stopping smoking, and  limiting alcohol. This information is not intended to replace advice given to you by your health care provider. Make sure you discuss any questions you have with your health care provider. Document Revised: 10/16/2019 Document Reviewed: 08/11/2019 Elsevier Patient Education  2021 [employer]  

## 2020-12-27 LAB — TSH+FREE T4
Free T4: 0.52 ng/dL — ABNORMAL LOW (ref 0.82–1.77)
TSH: 33.6 u[IU]/mL — ABNORMAL HIGH (ref 0.450–4.500)

## 2020-12-28 ENCOUNTER — Ambulatory Visit (INDEPENDENT_AMBULATORY_CARE_PROVIDER_SITE_OTHER): Payer: BC Managed Care – PPO

## 2020-12-28 ENCOUNTER — Other Ambulatory Visit (INDEPENDENT_AMBULATORY_CARE_PROVIDER_SITE_OTHER): Payer: Self-pay | Admitting: Primary Care

## 2020-12-28 ENCOUNTER — Other Ambulatory Visit: Payer: Self-pay

## 2020-12-28 DIAGNOSIS — R55 Syncope and collapse: Secondary | ICD-10-CM

## 2020-12-28 DIAGNOSIS — E890 Postprocedural hypothyroidism: Secondary | ICD-10-CM

## 2020-12-28 MED ORDER — LEVOTHYROXINE SODIUM 137 MCG PO TABS
137.0000 ug | ORAL_TABLET | Freq: Every day | ORAL | 1 refills | Status: DC
Start: 2020-12-28 — End: 2021-02-06
  Filled 2020-12-28: qty 30, 30d supply, fill #0
  Filled 2021-02-06: qty 30, 30d supply, fill #1

## 2020-12-29 ENCOUNTER — Other Ambulatory Visit: Payer: Self-pay

## 2020-12-30 LAB — CUP PACEART REMOTE DEVICE CHECK
Battery Remaining Longevity: 150 mo
Battery Remaining Percentage: 100 %
Brady Statistic RA Percent Paced: 0 %
Brady Statistic RV Percent Paced: 0 %
Date Time Interrogation Session: 20220406030100
Implantable Lead Implant Date: 20060119
Implantable Lead Implant Date: 20060119
Implantable Lead Location: 753859
Implantable Lead Location: 753860
Implantable Lead Model: 4456
Implantable Lead Model: 4469
Implantable Lead Serial Number: 439053
Implantable Lead Serial Number: 457210
Implantable Pulse Generator Implant Date: 20180926
Lead Channel Impedance Value: 396 Ohm
Lead Channel Impedance Value: 398 Ohm
Lead Channel Pacing Threshold Amplitude: 0.4 V
Lead Channel Pacing Threshold Amplitude: 0.8 V
Lead Channel Pacing Threshold Pulse Width: 0.4 ms
Lead Channel Pacing Threshold Pulse Width: 0.4 ms
Lead Channel Setting Pacing Amplitude: 2 V
Lead Channel Setting Pacing Amplitude: 2.5 V
Lead Channel Setting Pacing Pulse Width: 0.4 ms
Lead Channel Setting Sensing Sensitivity: 2.5 mV
Pulse Gen Serial Number: 789100

## 2021-01-04 ENCOUNTER — Other Ambulatory Visit: Payer: Self-pay

## 2021-01-04 MED FILL — Cyclobenzaprine HCl Tab 10 MG: ORAL | 30 days supply | Qty: 30 | Fill #0 | Status: CN

## 2021-01-05 ENCOUNTER — Other Ambulatory Visit: Payer: Self-pay

## 2021-01-09 NOTE — Progress Notes (Signed)
Remote pacemaker transmission.   

## 2021-01-12 ENCOUNTER — Other Ambulatory Visit: Payer: Self-pay

## 2021-01-23 ENCOUNTER — Other Ambulatory Visit: Payer: Self-pay

## 2021-01-23 MED FILL — Cyclobenzaprine HCl Tab 10 MG: ORAL | 30 days supply | Qty: 30 | Fill #0 | Status: AC

## 2021-02-06 ENCOUNTER — Other Ambulatory Visit (INDEPENDENT_AMBULATORY_CARE_PROVIDER_SITE_OTHER): Payer: Self-pay | Admitting: Primary Care

## 2021-02-06 ENCOUNTER — Other Ambulatory Visit: Payer: Self-pay

## 2021-02-06 ENCOUNTER — Ambulatory Visit (INDEPENDENT_AMBULATORY_CARE_PROVIDER_SITE_OTHER): Payer: BC Managed Care – PPO | Admitting: Primary Care

## 2021-02-06 ENCOUNTER — Encounter (INDEPENDENT_AMBULATORY_CARE_PROVIDER_SITE_OTHER): Payer: Self-pay | Admitting: Primary Care

## 2021-02-06 VITALS — BP 131/93 | HR 89 | Temp 98.8°F | Ht 61.0 in | Wt 186.8 lb

## 2021-02-06 DIAGNOSIS — E890 Postprocedural hypothyroidism: Secondary | ICD-10-CM | POA: Diagnosis not present

## 2021-02-06 DIAGNOSIS — I10 Essential (primary) hypertension: Secondary | ICD-10-CM | POA: Diagnosis not present

## 2021-02-06 DIAGNOSIS — M62830 Muscle spasm of back: Secondary | ICD-10-CM | POA: Diagnosis not present

## 2021-02-06 MED ORDER — AMLODIPINE BESYLATE 5 MG PO TABS
5.0000 mg | ORAL_TABLET | Freq: Every day | ORAL | 1 refills | Status: DC
  Filled 2021-02-06: qty 30, 30d supply, fill #0
  Filled 2021-03-16: qty 30, 30d supply, fill #1
  Filled 2021-05-26: qty 30, 30d supply, fill #2

## 2021-02-06 MED ORDER — LEVOTHYROXINE SODIUM 137 MCG PO TABS
137.0000 ug | ORAL_TABLET | Freq: Every day | ORAL | 0 refills | Status: DC
  Filled 2021-02-06: qty 15, 15d supply, fill #0

## 2021-02-06 MED ORDER — CYCLOBENZAPRINE HCL 10 MG PO TABS
ORAL_TABLET | ORAL | 1 refills | Status: DC
  Filled 2021-02-06: qty 60, fill #0
  Filled 2021-02-07 – 2021-02-09 (×2): qty 60, 30d supply, fill #0
  Filled 2021-03-16: qty 60, 30d supply, fill #1

## 2021-02-06 MED ORDER — LISINOPRIL-HYDROCHLOROTHIAZIDE 20-25 MG PO TABS
1.0000 | ORAL_TABLET | Freq: Every day | ORAL | 1 refills | Status: DC
  Filled 2021-02-06: qty 30, 30d supply, fill #0
  Filled 2021-03-20 – 2021-03-29 (×2): qty 90, 90d supply, fill #0
  Filled 2021-05-26: qty 90, 90d supply, fill #1

## 2021-02-06 NOTE — Progress Notes (Signed)
Established Patient Office Visit  Subjective:  Patient ID: Jean Rowe, female    DOB: 05/09/1986  Age: 35 y.o. MRN: [id number]  CC:  Chief Complaint  Patient presents with  . Blood Pressure Check    HPI Jean Rowe is a 35 year old female presents for  Management of hypothyroidism.  HYPOTHYROIDISM Thyroid control status:uncontrolled Satisfied with current treatment? no Medication side effects: no Medication compliance: good compliance Etiology of hypothyroidism:  Recent dose adjustment:yes Fatigue: yes Cold intolerance: yes Heat intolerance: yes Weight gain: yes Weight loss: no Constipation: yes Diarrhea/loose stools: yes Palpitations: yes Lower extremity edema: yes Anxiety/depressed mood: yes She is also, requesting muscle spasm medication for her chronic back pain with spasm. Blood pressure is elevated she is out of medications for 3-4 days. Denies shortness of breath, headaches, chest pain or lower extremity edema  Past Medical History:  Diagnosis Date  . Acne   . Anxiety    panic attacks  . COVID-19 virus infection 09/2020  . Daily headache   . Heart murmur   . History of IBS   . History of kidney infection   . Hypothyroidism   . Morbid obesity (HCC)   . Neurocardiogenic syncope   . Neurocardiogenic syncope   . Orthopnoea   . 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 Panic attacks   . Symptomatic bradycardia   . Thyroid disease     Past Surgical History:  Procedure Laterality Date  . DILATION AND CURETTAGE OF UTERUS  2013  . INSERT / REPLACE / REMOVE PACEMAKER  09/2004  . PPM GENERATOR CHANGEOUT N/A 06/19/2017   Procedure: PPM GENERATOR CHANGEOUT;  Surgeon: Thurmon Fair, MD;  Location: [facility];  Service: Cardiovascular;  Laterality: N/A;  . THYROIDECTOMY  11/04/2013  . THYROIDECTOMY N/A 11/04/2013   Procedure: THYROIDECTOMY;  Surgeon: Ernestene Mention, MD;  Location: [facility] OR;  Service: General;  Laterality: N/A;     Family History  Problem Relation Age of Onset  . Thyroid disease Mother   . Heart disease Mother   . Thyroid disease Maternal Uncle   . Thyroid disease Maternal Grandmother   . Heart failure Maternal Grandmother   . Hypertension Maternal Grandmother   . Hypertension Maternal Grandfather     Social History   Socioeconomic History  . Marital status: Single    Spouse name: Not on file  . Number of children: Not on file  . Years of education: Not on file  . Highest education level: Not on file  Occupational History  . Not on file  Tobacco Use  . Smoking status: Current Every Day Smoker    Years: 0.50    Types: Cigars  . Smokeless tobacco: Never Used  . Tobacco comment: 11/04/2013 "smoke 3 black N milds/day"  Substance and Sexual Activity  . Alcohol use: Yes    Comment: 11/04/2013 "mixed drinks or wine once a month  . Drug use: No    Types: Marijuana    Comment: none for 6 mos. since 03/2014  . Sexual activity: Yes    Birth control/protection: I.U.D.  Other Topics Concern  . Not on file  Social History Narrative   Epworth Sleepiness Scale = 12 (as of 10/22/2016)   Social Determinants of Health   Financial Resource Strain: Not on file  Food Insecurity: Not on file  Transportation Needs: Not on file  Physical Activity: Not on file  Stress: Not on file  Social Connections: Not on file  Intimate Partner Violence: Not on file    Outpatient Medications Prior to Visit  Medication Sig Dispense Refill  . celecoxib (CELEBREX) 100 MG capsule Take 1 capsule (100 mg total) by mouth 2 (two) times daily. 180 capsule 1  . amLODipine (NORVASC) 5 MG tablet Take 1 tablet (5 mg total) by mouth daily. 90 tablet 3  . cyclobenzaprine (FLEXERIL) 10 MG tablet TAKE 1 TABLET BY MOUTH TWICE DAILY AS NEEDED FOR MUSCLE SPASM NEEDS APPT. 30 tablet 0  . levothyroxine (SYNTHROID) 137 MCG tablet Take 1 tablet (137 mcg total) by mouth daily before breakfast. 30 tablet 1  .  lisinopril-hydrochlorothiazide (ZESTORETIC) 20-25 MG tablet TAKE 1 TABLET BY MOUTH DAILY. 90 tablet 1   No facility-administered medications prior to visit.    Allergies  Allergen Reactions  . Banana Anaphylaxis    Swelling of throat  . 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 Metronidazole     Oral sores   . Latex Rash  . Penicillins Other (See Comments)    Has patient had a PCN reaction causing immediate rash, facial/tongue/throat swelling, SOB or lightheadedness with hypotension: unknown Has patient had a PCN reaction causing severe rash involving mucus membranes or skin necrosis:unknownHas patient had a PCN reaction that required hospitalization unknown Has patient had a PCN reaction occurring within the last 10 years: unknown If all of the above answers are "NO", then may proceed with Cephalosporin use.     ROS Review of Systems  All other systems reviewed and are negative.     Objective:    Physical Exam Vitals reviewed.  Constitutional:      Appearance: She is obese.  HENT:     Head: Normocephalic.     Right Ear: Tympanic membrane and external ear normal.     Left Ear: Tympanic membrane and external ear normal.     Nose: Nose normal.  Eyes:     Extraocular Movements: Extraocular movements intact.     Pupils: Pupils are equal, round, and reactive to light.  Cardiovascular:     Rate and Rhythm: Normal rate and regular rhythm.  Pulmonary:     Effort: Pulmonary effort is normal.     Breath sounds: Normal breath sounds.  Abdominal:     General: Bowel sounds are normal. There is distension.     Palpations: Abdomen is soft.  Musculoskeletal:        General: Normal range of motion.     Cervical back: Normal range of motion and neck supple.  Skin:    General: Skin is warm and dry.  Neurological:     Mental Status: She is alert and oriented to person, place, and time.  Psychiatric:        Mood and  Affect: Mood normal.        Behavior: Behavior normal.        Thought Content: Thought content normal.     BP (!) 131/93 (BP Location: Right Arm, Patient Position: Sitting, Cuff Size: Large)   Pulse 89   Temp 98.8 F (37.1 C) (Temporal)   Ht 5\' 1"  (1.549 m)   Wt 186 lb 12.8 oz (84.7 kg)   SpO2 95%   BMI 35.30 kg/m  Wt Readings from Last 3 Encounters:  02/06/21 186 lb 12.8 oz (84.7 kg)  12/26/20 183 lb 6.4 oz (83.2 kg)  10/08/20 180 lb (81.6 kg)     Health Maintenance Due  Topic Date Due  . COVID-19 Vaccine (1) Never done  . Hepatitis C Screening  Never done    There are no preventive care reminders to display for this patient.  Lab Results  Component Value Date   TSH 14.200 (H) 02/06/2021   Lab Results  Component Value Date   WBC 7.9 10/08/2020   HGB 12.9 10/08/2020   HCT 37.7 10/08/2020   MCV 94.3 10/08/2020   PLT 311 10/08/2020   Lab Results  Component Value Date   NA 134 (L) 10/08/2020   K 3.9 10/08/2020   CO2 23 10/08/2020   GLUCOSE 96 10/08/2020   BUN 10 10/08/2020   CREATININE 0.97 10/08/2020   BILITOT <0.2 10/13/2019   ALKPHOS 112 10/13/2019   AST 25 10/13/2019   ALT 28 10/13/2019   PROT 7.0 10/13/2019   ALBUMIN 4.5 10/13/2019   CALCIUM 8.9 10/08/2020   ANIONGAP 8 10/08/2020   GFR 101.94 08/06/2016   No results found for: CHOL No results found for: HDL No results found for: LDLCALC No results found for: TRIG No results found for: CHOLHDL No results found for: ZOXW9U    Assessment & Plan:  Helmi was seen today for blood pressure check.  Diagnoses and all orders for this visit:  Muscle spasm of back cyclobenzaprine (FLEXERIL) 10 MG tablet; TAKE 1 TABLET BY MOUTH TWICE DAILY AS NEEDED FOR MUSCLE SPASM NEEDS APPT.  Essential hypertension Counseled on blood pressure goal of less than 130/80, low-sodium, DASH diet, medication compliance, 150 minutes of moderate intensity exercise per week. Discussed medication compliance, adverse  effects. -     amLODipine (NORVASC) 5 MG tablet; Take 1 tablet (5 mg total) by mouth daily. -     lisinopril-hydrochlorothiazide (ZESTORETIC) 20-25 MG tablet; TAKE 1 TABLET BY MOUTH DAILY.  Postsurgical hypothyroidism -     levothyroxine (SYNTHROID) 137 MCG tablet; Take 1 tablet (137 mcg total) by mouth daily before breakfast. -     TSH + free T4  Other orders -     cyclobenzaprine (FLEXERIL) 10 MG tablet; TAKE 1 TABLET BY MOUTH TWICE DAILY AS NEEDED FOR MUSCLE SPASM NEEDS APPT.    Meds ordered this encounter  Medications  . amLODipine (NORVASC) 5 MG tablet    Sig: Take 1 tablet (5 mg total) by mouth daily.    Dispense:  90 tablet    Refill:  1  . lisinopril-hydrochlorothiazide (ZESTORETIC) 20-25 MG tablet    Sig: TAKE 1 TABLET BY MOUTH DAILY.    Dispense:  90 tablet    Refill:  1  . levothyroxine (SYNTHROID) 137 MCG tablet    Sig: Take 1 tablet (137 mcg total) by mouth daily before breakfast.    Dispense:  15 tablet    Refill:  0  . cyclobenzaprine (FLEXERIL) 10 MG tablet    Sig: TAKE 1 TABLET BY MOUTH TWICE DAILY AS NEEDED FOR MUSCLE SPASM NEEDS APPT.    Dispense:  60 tablet    Refill:  1    Follow-up: Return in about 6 weeks (around 03/20/2021) for TSH f/u.    Grayce Sessions, NP

## 2021-02-06 NOTE — Telephone Encounter (Signed)
Requested medication (s) are due for refill today: yes  Requested medication (s) are on the active medication list: yes  Last refill:  01/23/21  Future visit scheduled: yes  Notes to clinic:  not delegated    Requested Prescriptions  Pending Prescriptions Disp Refills   cyclobenzaprine (FLEXERIL) 10 MG tablet 30 tablet 0    Sig: TAKE 1 TABLET BY MOUTH TWICE DAILY AS NEEDED FOR MUSCLE SPASM NEEDS APPT.      Not Delegated - Analgesics:  Muscle Relaxants Failed - 02/06/2021  3:00 PM      Failed - This refill cannot be delegated      Passed - Valid encounter within last 6 months    Recent Outpatient Visits           Today    [facility] Grayce Sessions, NP   1 month ago Postsurgical hypothyroidism   [facility] Grayce Sessions, NP   6 months ago Essential hypertension   [facility] Grayce Sessions, NP   9 months ago Burning with urination   [facility] Grayce Sessions, NP   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

## 2021-02-07 ENCOUNTER — Other Ambulatory Visit: Payer: Self-pay

## 2021-02-07 ENCOUNTER — Encounter (INDEPENDENT_AMBULATORY_CARE_PROVIDER_SITE_OTHER): Payer: Self-pay | Admitting: Primary Care

## 2021-02-07 LAB — TSH+FREE T4
Free T4: 0.84 ng/dL (ref 0.82–1.77)
TSH: 14.2 u[IU]/mL — ABNORMAL HIGH (ref 0.450–4.500)

## 2021-02-09 ENCOUNTER — Other Ambulatory Visit (INDEPENDENT_AMBULATORY_CARE_PROVIDER_SITE_OTHER): Payer: Self-pay | Admitting: Primary Care

## 2021-02-09 DIAGNOSIS — E890 Postprocedural hypothyroidism: Secondary | ICD-10-CM

## 2021-02-09 MED ORDER — LEVOTHYROXINE SODIUM 150 MCG PO TABS
137.0000 ug | ORAL_TABLET | Freq: Every day | ORAL | 1 refills | Status: DC
  Filled 2021-02-09: qty 60, 60d supply, fill #0
  Filled 2021-03-16: qty 60, 60d supply, fill #1

## 2021-02-10 ENCOUNTER — Other Ambulatory Visit: Payer: Self-pay

## 2021-02-21 ENCOUNTER — Other Ambulatory Visit: Payer: Self-pay

## 2021-03-16 ENCOUNTER — Other Ambulatory Visit: Payer: Self-pay

## 2021-03-20 ENCOUNTER — Other Ambulatory Visit: Payer: Self-pay

## 2021-03-20 ENCOUNTER — Other Ambulatory Visit (INDEPENDENT_AMBULATORY_CARE_PROVIDER_SITE_OTHER): Payer: BC Managed Care – PPO

## 2021-03-20 ENCOUNTER — Other Ambulatory Visit (INDEPENDENT_AMBULATORY_CARE_PROVIDER_SITE_OTHER): Payer: Self-pay | Admitting: Primary Care

## 2021-03-20 DIAGNOSIS — E890 Postprocedural hypothyroidism: Secondary | ICD-10-CM

## 2021-03-21 LAB — TSH+FREE T4
Free T4: 2.21 ng/dL — ABNORMAL HIGH (ref 0.82–1.77)
TSH: 0.038 u[IU]/mL — ABNORMAL LOW (ref 0.450–4.500)

## 2021-03-22 ENCOUNTER — Other Ambulatory Visit (INDEPENDENT_AMBULATORY_CARE_PROVIDER_SITE_OTHER): Payer: Self-pay | Admitting: Primary Care

## 2021-03-22 ENCOUNTER — Other Ambulatory Visit: Payer: Self-pay

## 2021-03-22 DIAGNOSIS — E890 Postprocedural hypothyroidism: Secondary | ICD-10-CM

## 2021-03-22 MED ORDER — LEVOTHYROXINE SODIUM 175 MCG PO TABS
175.0000 ug | ORAL_TABLET | Freq: Every day | ORAL | 0 refills | Status: DC
  Filled 2021-03-22 – 2021-03-29 (×2): qty 60, 60d supply, fill #0

## 2021-03-28 ENCOUNTER — Other Ambulatory Visit: Payer: Self-pay

## 2021-03-29 ENCOUNTER — Other Ambulatory Visit: Payer: Self-pay

## 2021-03-29 ENCOUNTER — Ambulatory Visit (INDEPENDENT_AMBULATORY_CARE_PROVIDER_SITE_OTHER): Payer: BC Managed Care – PPO

## 2021-03-29 DIAGNOSIS — R55 Syncope and collapse: Secondary | ICD-10-CM

## 2021-03-30 ENCOUNTER — Other Ambulatory Visit: Payer: Self-pay

## 2021-03-30 LAB — CUP PACEART REMOTE DEVICE CHECK
Battery Remaining Longevity: 144 mo
Battery Remaining Percentage: 100 %
Brady Statistic RA Percent Paced: 0 %
Brady Statistic RV Percent Paced: 0 %
Date Time Interrogation Session: 20220706030200
Implantable Lead Implant Date: 20060119
Implantable Lead Implant Date: 20060119
Implantable Lead Location: 753859
Implantable Lead Location: 753860
Implantable Lead Model: 4456
Implantable Lead Model: 4469
Implantable Lead Serial Number: 439053
Implantable Lead Serial Number: 457210
Implantable Pulse Generator Implant Date: 20180926
Lead Channel Impedance Value: 419 Ohm
Lead Channel Impedance Value: 440 Ohm
Lead Channel Pacing Threshold Amplitude: 0.4 V
Lead Channel Pacing Threshold Amplitude: 0.8 V
Lead Channel Pacing Threshold Pulse Width: 0.4 ms
Lead Channel Pacing Threshold Pulse Width: 0.4 ms
Lead Channel Setting Pacing Amplitude: 2 V
Lead Channel Setting Pacing Amplitude: 2.5 V
Lead Channel Setting Pacing Pulse Width: 0.4 ms
Lead Channel Setting Sensing Sensitivity: 2.5 mV
Pulse Gen Serial Number: 789100

## 2021-04-20 NOTE — Progress Notes (Signed)
Remote pacemaker transmission.   

## 2021-04-21 ENCOUNTER — Other Ambulatory Visit (INDEPENDENT_AMBULATORY_CARE_PROVIDER_SITE_OTHER): Payer: Self-pay | Admitting: Primary Care

## 2021-04-21 DIAGNOSIS — E890 Postprocedural hypothyroidism: Secondary | ICD-10-CM

## 2021-04-21 NOTE — Telephone Encounter (Signed)
Sent to PCP to refill if appropriate.  

## 2021-04-25 ENCOUNTER — Other Ambulatory Visit: Payer: Self-pay

## 2021-04-25 ENCOUNTER — Other Ambulatory Visit (INDEPENDENT_AMBULATORY_CARE_PROVIDER_SITE_OTHER): Payer: Self-pay | Admitting: Primary Care

## 2021-04-25 DIAGNOSIS — E890 Postprocedural hypothyroidism: Secondary | ICD-10-CM

## 2021-04-25 MED ORDER — CYCLOBENZAPRINE HCL 10 MG PO TABS
ORAL_TABLET | ORAL | 0 refills | Status: DC
  Filled 2021-04-25: qty 60, 30d supply, fill #0

## 2021-04-25 MED ORDER — LEVOTHYROXINE SODIUM 175 MCG PO TABS
175.0000 ug | ORAL_TABLET | Freq: Every day | ORAL | 0 refills | Status: DC
  Filled 2021-04-25 – 2021-05-26 (×2): qty 15, 15d supply, fill #0

## 2021-04-25 NOTE — Telephone Encounter (Signed)
Needs labs; orders placed

## 2021-04-25 NOTE — Telephone Encounter (Signed)
Sent to pcp

## 2021-04-25 NOTE — Telephone Encounter (Signed)
Requested medication (s) are due for refill today: yes  Requested medication (s) are on the active medication list: yes  Last refill:  cyclobenzaprine: 02/06/21 #60 1 RF          Future visit scheduled: no  Notes to clinic:  med not delegated to NT to RF   Requested Prescriptions  Pending Prescriptions Disp Refills   cyclobenzaprine (FLEXERIL) 10 MG tablet 60 tablet 1    Sig: TAKE 1 TABLET BY MOUTH TWICE DAILY AS NEEDED FOR MUSCLE SPASM NEEDS APPT.      Not Delegated - Analgesics:  Muscle Relaxants Failed - 04/25/2021  8:56 AM      Failed - This refill cannot be delegated      Passed - Valid encounter within last 6 months    Recent Outpatient Visits           2 months ago Muscle spasm of back   [facility] Grayce Sessions, NP   4 months ago Postsurgical hypothyroidism   [facility] Grayce Sessions, NP   8 months ago Essential hypertension   [facility] Grayce Sessions, NP   12 months ago Burning with urination   [facility] Grayce Sessions, NP   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      Endocrinology:  Hypothyroid Agents Failed - 04/25/2021  8:56 AM      Failed - TSH needs to be rechecked within 3 months after an abnormal result. Refill until TSH is due.      Failed - TSH in normal range and within 360 days    TSH  Date Value Ref Range Status  03/20/2021 0.038 (L) 0.450 - 4.500 uIU/mL Final          Passed - Valid encounter within last 12 months    Recent Outpatient Visits           2 months ago Muscle spasm of back   [facility] Grayce Sessions, NP   4 months ago Postsurgical hypothyroidism   [facility] Grayce Sessions, NP   8 months ago Essential hypertension   [facility] Grayce Sessions, NP   12 months ago Burning with urination   [facility] Grayce Sessions, NP   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

## 2021-04-26 ENCOUNTER — Other Ambulatory Visit: Payer: Self-pay

## 2021-05-26 ENCOUNTER — Other Ambulatory Visit (INDEPENDENT_AMBULATORY_CARE_PROVIDER_SITE_OTHER): Payer: Self-pay | Admitting: Primary Care

## 2021-05-26 NOTE — Telephone Encounter (Signed)
Requested medications are due for refill today yes  Requested medications are on the active medication list yes  Last refill 8/3  Last visit 02/06/21  Future visit scheduled no  Notes to clinic Not Delegated.

## 2021-05-30 ENCOUNTER — Other Ambulatory Visit: Payer: Self-pay

## 2021-06-02 ENCOUNTER — Other Ambulatory Visit (INDEPENDENT_AMBULATORY_CARE_PROVIDER_SITE_OTHER): Payer: Self-pay | Admitting: Primary Care

## 2021-06-03 NOTE — Telephone Encounter (Signed)
Requested medication (s) are due for refill today: yes  Requested medication (s) are on the active medication list: yes  Last refill:  04/25/21  Future visit scheduled: yes on 06/19/21  Notes to clinic:  med not delegated to NT to RF   Requested Prescriptions  Pending Prescriptions Disp Refills   cyclobenzaprine (FLEXERIL) 10 MG tablet 60 tablet 0    Sig: TAKE 1 TABLET BY MOUTH TWICE DAILY AS NEEDED FOR MUSCLE SPASM NEEDS APPT.     Not Delegated - Analgesics:  Muscle Relaxants Failed - 06/02/2021  6:23 PM      Failed - This refill cannot be delegated      Passed - Valid encounter within last 6 months    Recent Outpatient Visits           3 months ago Muscle spasm of back   [facility] Grayce Sessions, NP   5 months ago Postsurgical hypothyroidism   [facility] Grayce Sessions, NP   10 months ago Essential hypertension   [facility] Grayce Sessions, NP   1 year ago Burning with urination   [facility] Grayce Sessions, NP   1 year ago Goiter, nontoxic, multinodular   [facility] Grayce Sessions, NP       Future Appointments             In 2 weeks Randa Evens, Kinnie Scales, NP [facility]

## 2021-06-06 NOTE — Telephone Encounter (Signed)
Sent to PCP ?

## 2021-06-07 ENCOUNTER — Other Ambulatory Visit (INDEPENDENT_AMBULATORY_CARE_PROVIDER_SITE_OTHER): Payer: Self-pay | Admitting: Primary Care

## 2021-06-07 ENCOUNTER — Other Ambulatory Visit: Payer: Self-pay

## 2021-06-07 ENCOUNTER — Encounter (INDEPENDENT_AMBULATORY_CARE_PROVIDER_SITE_OTHER): Payer: Self-pay | Admitting: Primary Care

## 2021-06-07 DIAGNOSIS — R202 Paresthesia of skin: Secondary | ICD-10-CM

## 2021-06-07 NOTE — Telephone Encounter (Signed)
Requested medications are due for refill today.  yes  Requested medications are on the active medications list.  yes  Last refill. 04/25/2021  Future visit scheduled.   yes  Notes to clinic.  Not delegated.

## 2021-06-08 ENCOUNTER — Other Ambulatory Visit: Payer: Self-pay

## 2021-06-08 MED ORDER — CYCLOBENZAPRINE HCL 10 MG PO TABS
ORAL_TABLET | ORAL | 0 refills | Status: DC
  Filled 2021-06-08: qty 60, 30d supply, fill #0

## 2021-06-19 ENCOUNTER — Encounter (INDEPENDENT_AMBULATORY_CARE_PROVIDER_SITE_OTHER): Payer: Self-pay | Admitting: Primary Care

## 2021-06-19 ENCOUNTER — Other Ambulatory Visit: Payer: Self-pay

## 2021-06-19 ENCOUNTER — Ambulatory Visit (INDEPENDENT_AMBULATORY_CARE_PROVIDER_SITE_OTHER): Payer: BC Managed Care – PPO | Admitting: Primary Care

## 2021-06-19 VITALS — BP 124/83 | HR 103 | Temp 97.7°F | Ht 61.0 in | Wt 179.8 lb

## 2021-06-19 DIAGNOSIS — M797 Fibromyalgia: Secondary | ICD-10-CM

## 2021-06-19 DIAGNOSIS — M62830 Muscle spasm of back: Secondary | ICD-10-CM | POA: Diagnosis not present

## 2021-06-19 DIAGNOSIS — E890 Postprocedural hypothyroidism: Secondary | ICD-10-CM | POA: Diagnosis not present

## 2021-06-19 DIAGNOSIS — I10 Essential (primary) hypertension: Secondary | ICD-10-CM | POA: Diagnosis not present

## 2021-06-19 DIAGNOSIS — Z760 Encounter for issue of repeat prescription: Secondary | ICD-10-CM

## 2021-06-19 DIAGNOSIS — Z789 Other specified health status: Secondary | ICD-10-CM

## 2021-06-19 MED ORDER — CELECOXIB 100 MG PO CAPS
100.0000 mg | ORAL_CAPSULE | Freq: Two times a day (BID) | ORAL | 1 refills | Status: DC
  Filled 2021-06-19 – 2022-03-13 (×2): qty 180, 90d supply, fill #0

## 2021-06-19 MED ORDER — LISINOPRIL-HYDROCHLOROTHIAZIDE 20-25 MG PO TABS
1.0000 | ORAL_TABLET | Freq: Every day | ORAL | 1 refills | Status: DC
  Filled 2021-06-19: qty 90, 90d supply, fill #0

## 2021-06-19 MED ORDER — AMLODIPINE BESYLATE 5 MG PO TABS
5.0000 mg | ORAL_TABLET | Freq: Every day | ORAL | 1 refills | Status: DC
  Filled 2021-06-19: qty 90, 90d supply, fill #0

## 2021-06-19 MED ORDER — CYCLOBENZAPRINE HCL 10 MG PO TABS
10.0000 mg | ORAL_TABLET | Freq: Every day | ORAL | 1 refills | Status: DC
  Filled 2021-06-19 – 2021-07-16 (×2): qty 90, 90d supply, fill #0
  Filled 2021-09-19: qty 90, 90d supply, fill #1
  Filled 2021-10-09 – 2021-10-16 (×2): qty 90, 90d supply, fill #0

## 2021-06-19 NOTE — Progress Notes (Signed)
  [facility]  Subjective:     [name] is a 35 y.o. female who presents for follow up of hypothyroidism. Current symptoms: change in energy level, palpitations, and weight changes. Patient denies diarrhea and heat / cold intolerance. Symptoms have waxed and waned but are better overall.  The following portions of the patient's history were reviewed and updated as appropriate: allergies, current medications, past family history, past medical history, past social history, and past surgical history.  Review of Systems Pertinent items noted in HPI and remainder of comprehensive ROS otherwise negative.    Objective:  BP 124/83 (BP Location: Right Arm, Patient Position: Sitting, Cuff Size: Large)   Pulse (!) 103   Temp 97.7 F (36.5 C) (Temporal)   Ht 5\' 1"  (1.549 m)   Wt 179 lb 12.8 oz (81.6 kg)   SpO2 93%   BMI 33.97 kg/m    General appearance: alert, appears stated age, and mildly obese Head: Normocephalic, without obvious abnormality, atraumatic Eyes: conjunctivae/corneas clear. PERRL, EOM's intact. Fundi benign. Ears: normal TM's and external ear canals both ears Nose: no discharge Neck: no adenopathy, no carotid bruit, no JVD, supple, symmetrical, trachea midline, and thyroid not enlarged, symmetric, no tenderness/mass/nodules Lungs: clear to auscultation bilaterally Heart: regular rate and rhythm, S1, S2 normal, no murmur, click, rub or gallop Abdomen: soft, non-tender; bowel sounds normal; no masses,  no organomegaly Extremities: extremities normal, atraumatic, no cyanosis or edema Skin: Skin color, texture, turgor normal. No rashes or lesions Lymph nodes: Cervical, supraclavicular, and axillary nodes normal. Neurologic: Alert and oriented X 3, normal strength and tone. Normal symmetric reflexes. Normal coordination and gait  Laboratory: Lab Results  Component Value Date   TSH 0.038 (L) 03/20/2021       Assessment:   Jean Rowe was seen today  for medication refill.  Diagnoses and all orders for this visit:  Postsurgical hypothyroidism -     TSH + free T4  Advised about management of weight Obesity is 30-39 indicating an excess in caloric intake or underlining conditions. This may lead to other co-morbidities. Lifestyle modifications of diet and exercise may reduce obesity.    Fibromyalgia -     cyclobenzaprine (FLEXERIL) 10 MG tablet; Take 1 tablet (10 mg total) by mouth at bedtime. Take 1 pill for muscle spasm at bedtime as needed -     celecoxib (CELEBREX) 100 MG capsule; Take 1 capsule (100 mg total) by mouth 2 (two) times daily.  Essential hypertension Counseled on blood pressure goal of less than 130/80, low-sodium, DASH diet, medication compliance, 150 minutes of moderate intensity exercise per week. Discussed medication compliance, adverse effects.  -     amLODipine (NORVASC) 5 MG tablet; Take 1 tablet (5 mg total) by mouth daily. -     lisinopril-hydrochlorothiazide (ZESTORETIC) 20-25 MG tablet; TAKE 1 TABLET BY MOUTH DAILY.  Muscle spasm of back -     celecoxib (CELEBREX) 100 MG capsule; Take 1 capsule (100 mg total) by mouth 2 (two) times daily.  Medication refill -     amLODipine (NORVASC) 5 MG tablet; Take 1 tablet (5 mg total) by mouth daily. -     celecoxib (CELEBREX) 100 MG capsule; Take 1 capsule (100 mg total) by mouth 2 (two) times daily. -     lisinopril-hydrochlorothiazide (ZESTORETIC) 20-25 MG tablet; TAKE 1 TABLET BY MOUTH DAILY.  Myrene Galas NP-C

## 2021-06-19 NOTE — Patient Instructions (Signed)

## 2021-06-20 ENCOUNTER — Other Ambulatory Visit (INDEPENDENT_AMBULATORY_CARE_PROVIDER_SITE_OTHER): Payer: Self-pay | Admitting: Primary Care

## 2021-06-20 DIAGNOSIS — E890 Postprocedural hypothyroidism: Secondary | ICD-10-CM

## 2021-06-20 LAB — TSH+FREE T4
Free T4: 1.93 ng/dL — ABNORMAL HIGH (ref 0.82–1.77)
TSH: 0.034 u[IU]/mL — ABNORMAL LOW (ref 0.450–4.500)

## 2021-06-20 MED ORDER — LEVOTHYROXINE SODIUM 200 MCG PO TABS
200.0000 ug | ORAL_TABLET | Freq: Every day | ORAL | 1 refills | Status: DC
  Filled 2021-06-20: qty 60, 60d supply, fill #0

## 2021-06-21 ENCOUNTER — Other Ambulatory Visit: Payer: Self-pay

## 2021-06-25 ENCOUNTER — Encounter (INDEPENDENT_AMBULATORY_CARE_PROVIDER_SITE_OTHER): Payer: Self-pay | Admitting: Primary Care

## 2021-06-28 ENCOUNTER — Ambulatory Visit (INDEPENDENT_AMBULATORY_CARE_PROVIDER_SITE_OTHER): Payer: BC Managed Care – PPO

## 2021-06-28 DIAGNOSIS — R55 Syncope and collapse: Secondary | ICD-10-CM

## 2021-06-28 LAB — CUP PACEART REMOTE DEVICE CHECK
Battery Remaining Longevity: 138 mo
Battery Remaining Percentage: 100 %
Brady Statistic RA Percent Paced: 0 %
Brady Statistic RV Percent Paced: 0 %
Date Time Interrogation Session: 20221005030200
Implantable Lead Implant Date: 20060119
Implantable Lead Implant Date: 20060119
Implantable Lead Location: 753859
Implantable Lead Location: 753860
Implantable Lead Model: 4456
Implantable Lead Model: 4469
Implantable Lead Serial Number: 439053
Implantable Lead Serial Number: 457210
Implantable Pulse Generator Implant Date: 20180926
Lead Channel Impedance Value: 481 Ohm
Lead Channel Impedance Value: 521 Ohm
Lead Channel Setting Pacing Amplitude: 2 V
Lead Channel Setting Pacing Amplitude: 2.5 V
Lead Channel Setting Pacing Pulse Width: 0.4 ms
Lead Channel Setting Sensing Sensitivity: 2.5 mV
Pulse Gen Serial Number: 789100

## 2021-07-06 NOTE — Progress Notes (Signed)
Remote pacemaker transmission.   

## 2021-07-17 ENCOUNTER — Other Ambulatory Visit: Payer: Self-pay

## 2021-07-24 ENCOUNTER — Ambulatory Visit (INDEPENDENT_AMBULATORY_CARE_PROVIDER_SITE_OTHER): Payer: Self-pay

## 2021-07-24 NOTE — Telephone Encounter (Signed)
FYI

## 2021-07-24 NOTE — Telephone Encounter (Signed)
Symptoms started Thursday - productive cough, sweats, vomiting and diarrhea. No availability in the practice this week per Malachi Bonds. Pt. Will go to [facility].    Reason for Disposition  [1] MILD difficulty breathing (e.g., minimal/no SOB at rest, SOB with walking, pulse <100) AND [2] still present when not coughing  Answer Assessment - Initial Assessment Questions 1. ONSET: "When did the cough begin?"      Thursday 2. SEVERITY: "How bad is the cough today?"      Severe 3. SPUTUM: "Describe the color of your sputum" (none, dry cough; clear, white, yellow, green)     Yellow 4. HEMOPTYSIS: "Are you coughing up any blood?" If so ask: "How much?" (flecks, streaks, tablespoons, etc.)     No 5. DIFFICULTY BREATHING: "Are you having difficulty breathing?" If Yes, ask: "How bad is it?" (e.g., mild, moderate, severe)    - MILD: No SOB at rest, mild SOB with walking, speaks normally in sentences, can lie down, no retractions, pulse < 100.    - MODERATE: SOB at rest, SOB with minimal exertion and prefers to sit, cannot lie down flat, speaks in phrases, mild retractions, audible wheezing, pulse 100-120.    - SEVERE: Very SOB at rest, speaks in single words, struggling to breathe, sitting hunched forward, retractions, pulse > 120      Mild 6. FEVER: "Do you have a fever?" If Yes, ask: "What is your temperature, how was it measured, and when did it start?"     Sweats 7. CARDIAC HISTORY: "Do you have any history of heart disease?" (e.g., heart attack, congestive heart failure)      Pacemaker 8. LUNG HISTORY: "Do you have any history of lung disease?"  (e.g., pulmonary embolus, asthma, emphysema)     No 9. PE RISK FACTORS: "Do you have a history of blood clots?" (or: recent major surgery, recent prolonged travel, bedridden)     No 10. OTHER SYMPTOMS: "Do you have any other symptoms?" (e.g., runny nose, wheezing, chest pain)       Diarrhea 11. PREGNANCY: "Is there any chance you are pregnant?" "When was your  last menstrual period?"       No 12. TRAVEL: "Have you traveled out of the country in the last month?" (e.g., travel history, exposures)       No  Protocols used: Cough - Acute Productive-A-AH

## 2021-07-25 ENCOUNTER — Ambulatory Visit (HOSPITAL_COMMUNITY)
Admission: RE | Admit: 2021-07-25 | Discharge: 2021-07-25 | Disposition: A | Discharge status: Home / Self Care | Payer: BC Managed Care – PPO | Source: Ambulatory Visit | Attending: Student | Admitting: Student

## 2021-07-25 ENCOUNTER — Other Ambulatory Visit: Payer: Self-pay

## 2021-07-25 ENCOUNTER — Encounter (HOSPITAL_COMMUNITY): Payer: Self-pay

## 2021-07-25 VITALS — BP 121/87 | HR 117 | Temp 98.5°F | Resp 19

## 2021-07-25 DIAGNOSIS — J111 Influenza due to unidentified influenza virus with other respiratory manifestations: Secondary | ICD-10-CM | POA: Diagnosis not present

## 2021-07-25 DIAGNOSIS — R197 Diarrhea, unspecified: Secondary | ICD-10-CM

## 2021-07-25 MED ORDER — ONDANSETRON 8 MG PO TBDP
8.0000 mg | ORAL_TABLET | Freq: Three times a day (TID) | ORAL | 0 refills | Status: DC | PRN
  Filled 2021-07-25: qty 18, 21d supply, fill #0

## 2021-07-25 MED ORDER — PROMETHAZINE-DM 6.25-15 MG/5ML PO SYRP
5.0000 mL | ORAL_SOLUTION | Freq: Four times a day (QID) | ORAL | 0 refills | Status: DC | PRN
  Filled 2021-07-25: qty 118, 6d supply, fill #0

## 2021-07-25 MED ORDER — LOPERAMIDE HCL 2 MG PO CAPS
2.0000 mg | ORAL_CAPSULE | Freq: Four times a day (QID) | ORAL | 0 refills | Status: DC | PRN
  Filled 2021-07-25: qty 12, 3d supply, fill #0

## 2021-07-25 NOTE — Discharge Instructions (Addendum)
-  Take the Zofran (ondansetron) up to 3 times daily for nausea and vomiting. Dissolve one pill under your tongue or between your teeth and your cheek. -Promethazine DM cough syrup for congestion/cough. This could make you drowsy, so take at night before bed. -Take the Imodium (loperamide) up to 4 times daily for diarrhea. -With a virus, you're typically contagious for 5-7 days, or as long as you're having fevers.

## 2021-07-25 NOTE — ED Provider Notes (Signed)
[place]    CSN: [id number] Arrival date & time: 07/25/21  0957      History   Chief Complaint Chief Complaint  Patient presents with   Appointment   Emesis   Fever   Diarrhea    HPI Jean Rowe is a 35 y.o. female presenting with viral syndrome for 4 days.  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  Describes 4 days of subjective fevers and chills, nonproductive cough, decreased appetite, nausea with vomiting, diarrhea.  Has not monitored temperature at home.  Over-the-counter medications are providing some relief.  States she has not had any vomiting or diarrhea today, but she did have a few episodes 1 day ago.  She is now tolerating fluids and food.  States she cannot be pregnant.  IUD for contraception.  HPI  Past Medical History:  Diagnosis Date   Acne    Anxiety    panic attacks   COVID-19 virus infection 09/2020   Daily headache    Heart murmur    History of IBS    History of kidney infection    Hypothyroidism    Morbid obesity ([place])    Neurocardiogenic syncope    Neurocardiogenic syncope    Orthopnoea    Pacemaker 09/2004   for neurocardio syncope   Panic attacks    Symptomatic bradycardia    Thyroid disease     Patient Active Problem List   Diagnosis Date Noted   Morbid obesity (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   Neurocardiogenic syncope 06/23/2013   Pacemaker - [employer] 2006 06/23/2013    Past Surgical History:  Procedure Laterality Date   DILATION AND CURETTAGE OF UTERUS  2013   INSERT / REPLACE / REMOVE PACEMAKER  09/2004   PPM GENERATOR CHANGEOUT N/A 06/19/2017   Procedure: PPM GENERATOR CHANGEOUT;  Surgeon: Sanda Klein, MD;  Location: [facility];  Service:  Cardiovascular;  Laterality: N/A;   THYROIDECTOMY  11/04/2013   THYROIDECTOMY N/A 11/04/2013   Procedure: THYROIDECTOMY;  Surgeon: Adin Hector, MD;  Location: [facility];  Service: General;  Laterality: N/A;    OB History     Gravida  2   Para  2   Term      Preterm      AB      Living  2      SAB      IAB      Ectopic      Multiple      Live Births               Home Medications    Prior to Admission medications   Medication Sig Start Date End Date Taking? Authorizing Provider  loperamide (IMODIUM) 2 MG capsule Take 1 capsule (2 mg total) by mouth 4 (four) times daily as needed for diarrhea or loose stools. 07/25/21  Yes Hazel Sams, PA-C  ondansetron (ZOFRAN ODT) 8 MG disintegrating tablet Take 1 tablet (8 mg total) by mouth every 8 (eight) hours as needed for nausea or vomiting. 07/25/21  Yes Hazel Sams, PA-C  promethazine-dextromethorphan (PROMETHAZINE-DM) 6.25-15 MG/5ML syrup Take 5 mLs by mouth 4 (four) times daily as needed for cough. 07/25/21  Yes Hazel Sams, PA-C  amLODipine (NORVASC) 5 MG tablet  Take 1 tablet (5 mg total) by mouth daily. 06/19/21   Kerin Perna, NP  celecoxib (CELEBREX) 100 MG capsule Take 1 capsule (100 mg total) by mouth 2 (two) times daily. 06/19/21   Kerin Perna, NP  cyclobenzaprine (FLEXERIL) 10 MG tablet Take 1 tablet (10 mg total) by mouth at bedtime. Take 1 pill for muscle spasm at bedtime as needed 06/19/21 06/19/22  Kerin Perna, NP  levothyroxine (SYNTHROID) 200 MCG tablet Take 1 tablet (200 mcg total) by mouth daily before breakfast. 06/20/21   Kerin Perna, NP  lisinopril-hydrochlorothiazide (ZESTORETIC) 20-25 MG tablet TAKE 1 TABLET BY MOUTH DAILY. 06/19/21 06/19/22  Kerin Perna, NP    Family History Family History  Problem Relation Age of Onset   Thyroid disease Mother    Heart disease Mother    Thyroid disease Maternal Uncle    Thyroid disease Maternal Grandmother    Heart  failure Maternal Grandmother    Hypertension Maternal Grandmother    Hypertension Maternal Grandfather     Social History Social History   Tobacco Use   Smoking status: Every Day    Types: Cigars   Smokeless tobacco: Never   Tobacco comments:    11/04/2013 "smoke 3 black N milds/day"  Substance Use Topics   Alcohol use: Yes    Comment: 11/04/2013 "mixed drinks or wine once a month   Drug use: No    Types: Marijuana    Comment: none for 6 mos. since 03/2014     Allergies   Banana, Other, Metronidazole, Latex, and Penicillins   Review of Systems Review of Systems  Constitutional:  Positive for chills and fever. Negative for appetite change.  HENT:  Positive for congestion. Negative for ear pain, rhinorrhea, sinus pressure, sinus pain and sore throat.   Eyes:  Negative for redness and visual disturbance.  Respiratory:  Positive for cough. Negative for chest tightness, shortness of breath and wheezing.   Cardiovascular:  Negative for chest pain and palpitations.  Gastrointestinal:  Positive for diarrhea, nausea and vomiting. Negative for abdominal pain and constipation.  Genitourinary:  Negative for dysuria, frequency and urgency.  Musculoskeletal:  Negative for myalgias.  Neurological:  Negative for dizziness, weakness and headaches.  Psychiatric/Behavioral:  Negative for confusion.   All other systems reviewed and are negative.   Physical Exam Triage Vital Signs ED Triage Vitals  Enc Vitals Group     BP 07/25/21 1029 121/87     Pulse Rate 07/25/21 1029 (!) 117     Resp 07/25/21 1029 19     Temp 07/25/21 1029 98.5 F (36.9 C)     Temp Source 07/25/21 1029 Oral     SpO2 07/25/21 1029 98 %     Weight --      Height --      Head Circumference --      Peak Flow --      Pain Score 07/25/21 1027 7     Pain Loc --      Pain Edu? --      Excl. in [place]? --    No data found.  Updated Vital Signs BP 121/87 (BP Location: Right Arm)   Pulse (!) 117   Temp 98.5 F (36.9  C) (Oral)   Resp 19   SpO2 98%   Visual Acuity Right Eye Distance:   Left Eye Distance:   Bilateral Distance:    Right Eye Near:   Left Eye Near:    Bilateral Near:  Physical Exam Vitals reviewed.  Constitutional:      General: She is not in acute distress.    Appearance: Normal appearance. She is ill-appearing.  HENT:     Head: Normocephalic and atraumatic.     Right Ear: Tympanic membrane, ear canal and external ear normal. No tenderness. No middle ear effusion. There is no impacted cerumen. Tympanic membrane is not perforated, erythematous, retracted or bulging.     Left Ear: Tympanic membrane, ear canal and external ear normal. No tenderness.  No middle ear effusion. There is no impacted cerumen. Tympanic membrane is not perforated, erythematous, retracted or bulging.     Nose: Nose normal. No congestion.     Mouth/Throat:     Mouth: Mucous membranes are moist.     Pharynx: Uvula midline. No oropharyngeal exudate or posterior oropharyngeal erythema.  Eyes:     Extraocular Movements: Extraocular movements intact.     Pupils: Pupils are equal, round, and reactive to light.  Cardiovascular:     Rate and Rhythm: Regular rhythm. Tachycardia present.     Heart sounds: Normal heart sounds.  Pulmonary:     Effort: Pulmonary effort is normal.     Breath sounds: Normal breath sounds. No decreased breath sounds, wheezing, rhonchi or rales.  Abdominal:     Palpations: Abdomen is soft.     Tenderness: There is no abdominal tenderness. There is no guarding or rebound.  Lymphadenopathy:     Cervical: No cervical adenopathy.     Right cervical: No superficial cervical adenopathy.    Left cervical: No superficial cervical adenopathy.  Neurological:     General: No focal deficit present.     Mental Status: She is alert and oriented to person, place, and time.  Psychiatric:        Mood and Affect: Mood normal.        Behavior: Behavior normal.        Thought Content: Thought  content normal.        Judgment: Judgment normal.     UC Treatments / Results  Labs (all labs ordered are listed, but only abnormal results are displayed) Labs Reviewed - No data to display  EKG   Radiology No results found.  Procedures Procedures (including critical care time)  Medications Ordered in UC Medications - No data to display  Initial Impression / Assessment and Plan / UC Course  I have reviewed the triage vital signs and the nursing notes.  Pertinent labs & imaging results that were available during my care of the patient were reviewed by me and considered in my medical decision making (see chart for details).     This patient is a very pleasant 35 y.o. year old female presenting with influenza A. Today this pt is afebrile but tachycardic nontachypneic, oxygenating well on room air, no wheezes rhonchi or rales.   Rapid influenza deferred given duration of symptoms, she is out of the Tamiflu window.  Zofran ODT and Imodium sent.  Also sent promethazine for cough.  ED return precautions discussed. Patient verbalizes understanding and agreement.   Coding Level 4 for acute illness with systemic symptoms, and prescription drug management   Final Clinical Impressions(s) / UC Diagnoses   Final diagnoses:  Influenza with respiratory manifestation  Diarrhea, unspecified type     Discharge Instructions      -Take the Zofran (ondansetron) up to 3 times daily for nausea and vomiting. Dissolve one pill under your tongue or between your teeth and your cheek. -  Promethazine DM cough syrup for congestion/cough. This could make you drowsy, so take at night before bed. -Take the Imodium (loperamide) up to 4 times daily for diarrhea. -With a virus, you're typically contagious for 5-7 days, or as long as you're having fevers.     ED Prescriptions     Medication Sig Dispense Auth. Provider   ondansetron (ZOFRAN ODT) 8 MG disintegrating tablet Take 1 tablet (8 mg  total) by mouth every 8 (eight) hours as needed for nausea or vomiting. 20 tablet Rhys Martini, PA-C   promethazine-dextromethorphan (PROMETHAZINE-DM) 6.25-15 MG/5ML syrup Take 5 mLs by mouth 4 (four) times daily as needed for cough. 118 mL Rhys Martini, PA-C   loperamide (IMODIUM) 2 MG capsule Take 1 capsule (2 mg total) by mouth 4 (four) times daily as needed for diarrhea or loose stools. 12 capsule Rhys Martini, PA-C      PDMP not reviewed this encounter.   Rhys Martini, PA-C 07/25/21 1105

## 2021-07-25 NOTE — ED Triage Notes (Signed)
Pt presents with vomiting, fever and diarrhea x 4 days. States she had night sweats and a cough. States she is not able to hold food down.

## 2021-07-27 IMAGING — CT CT ABD-PELV W/ CM
2 of 4 series · 16 of 46 positions shown, 18 images · IV contrast (omnipaque)
Comparison: CT dated February 23, 2017.

CLINICAL DATA: Right lower quadrant abdominal pain. Chest pain and
abdominal pain.

EXAM:
CT ABDOMEN AND PELVIS WITH CONTRAST
TECHNIQUE: Multidetector CT imaging of the abdomen and pelvis was performed
using the standard protocol following bolus administration of
intravenous contrast.
CONTRAST:  100mL OMNIPAQUE IOHEXOL 300 MG/ML  SOLN

[Series 2: axial st · axial · 0.75mm/px · z∈[-440,-60]mm · 13 of 86 slices shown, 15 images]
[im 5/86  soft-tissue]
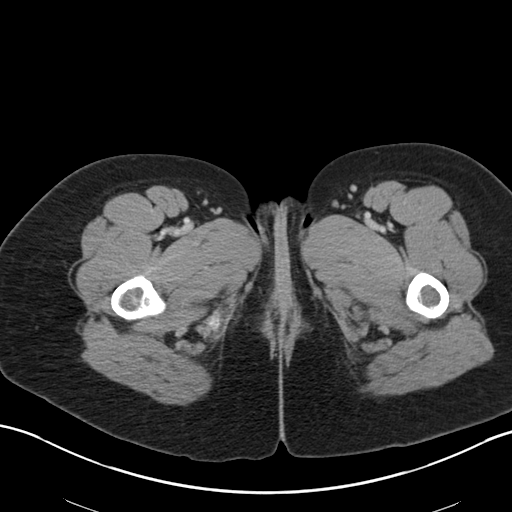
[im 5/86  bone]
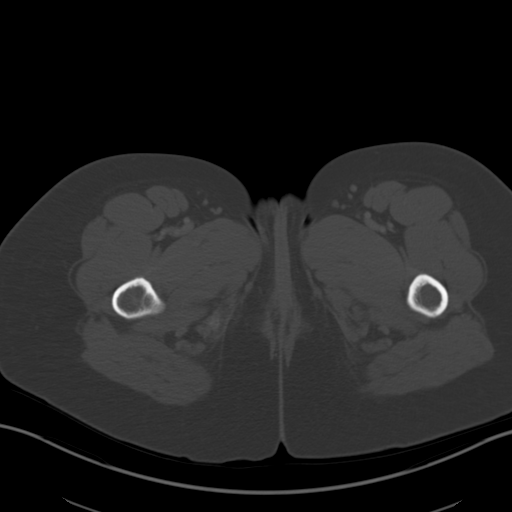
[im 13/86  soft-tissue]
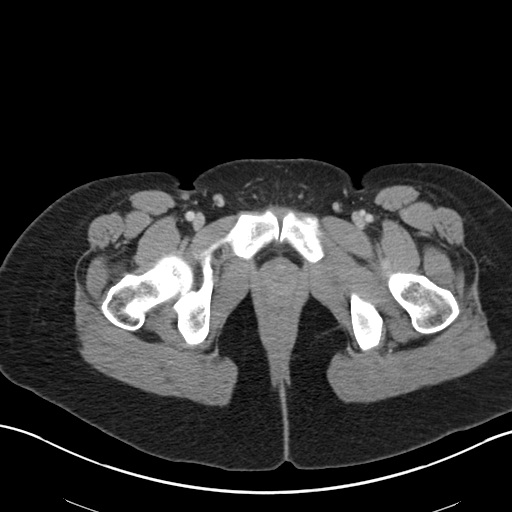
[im 17/86  soft-tissue]
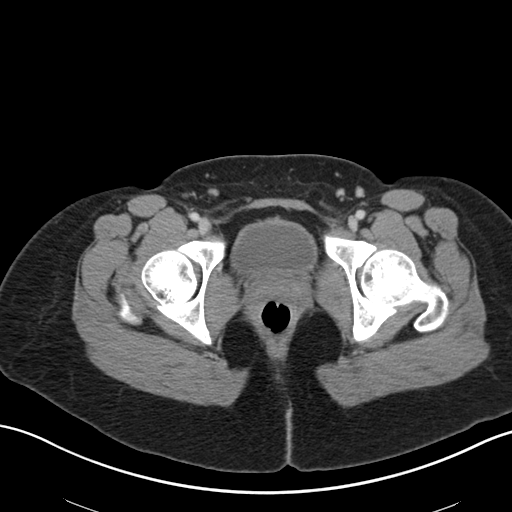
[im 25/86  soft-tissue]
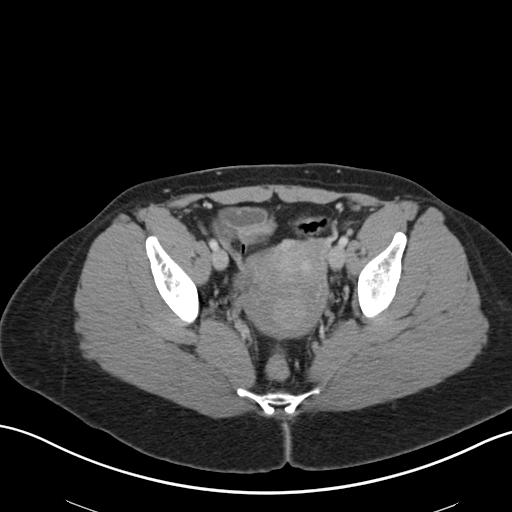
[im 29/86  soft-tissue]
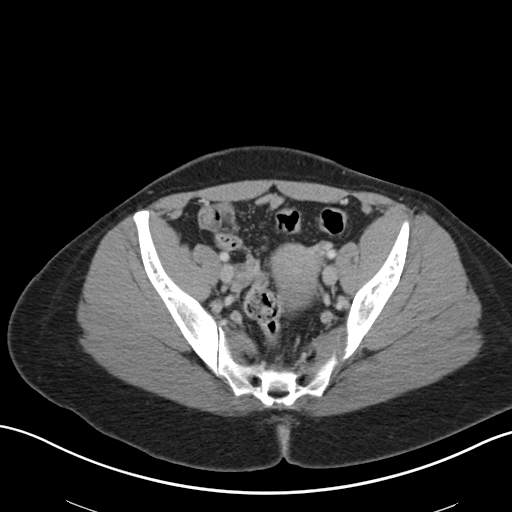
[im 37/86  soft-tissue]
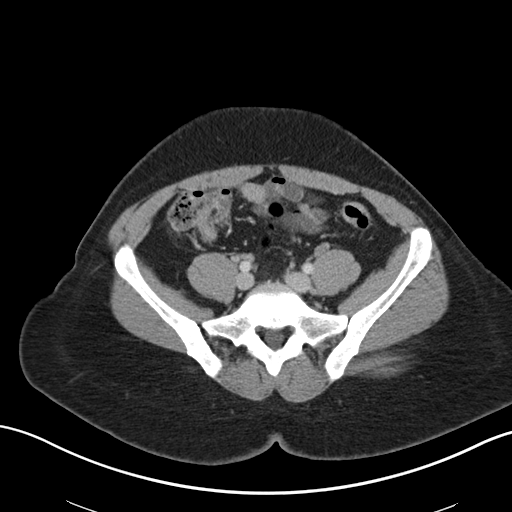
[im 45/86  soft-tissue]
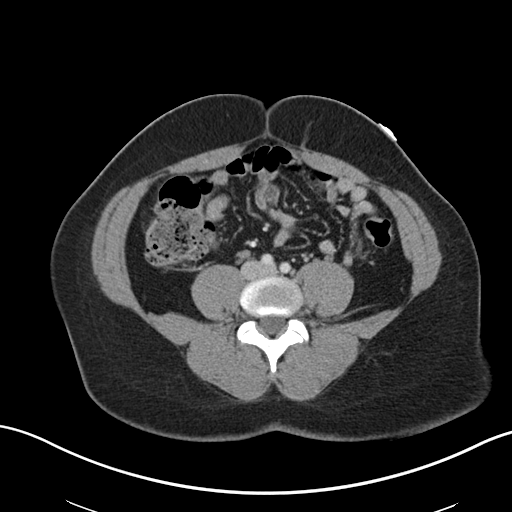
[im 49/86  soft-tissue]
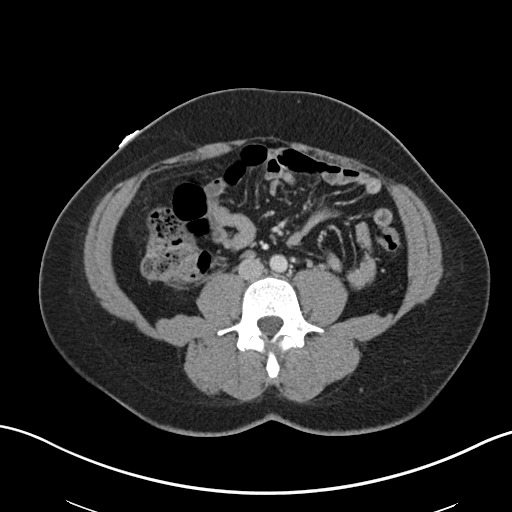
[im 57/86  soft-tissue]
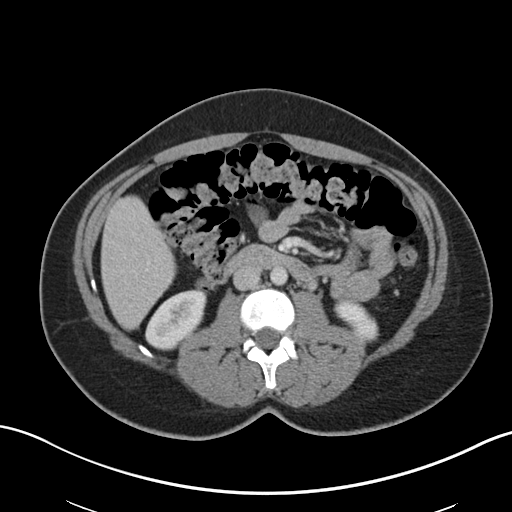
[im 57/86  bone]
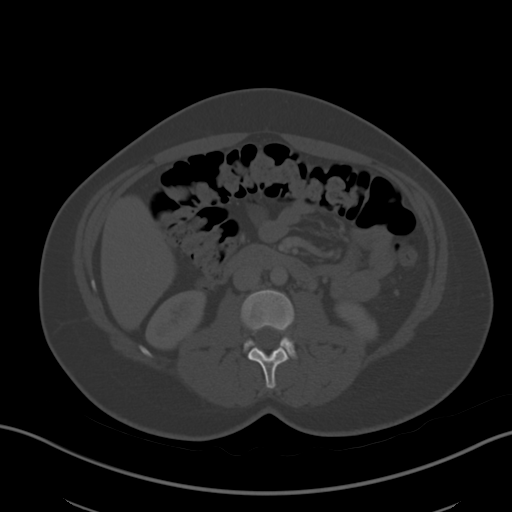
[im 61/86  soft-tissue]
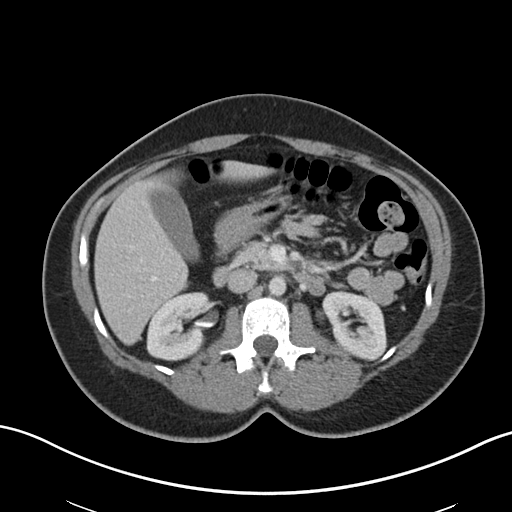
[im 69/86  soft-tissue]
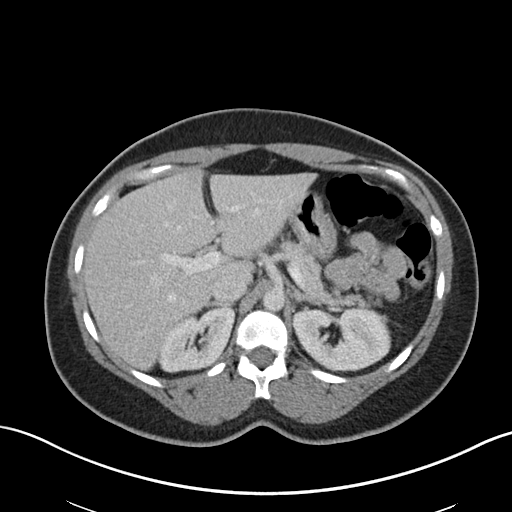
[im 73/86  soft-tissue]
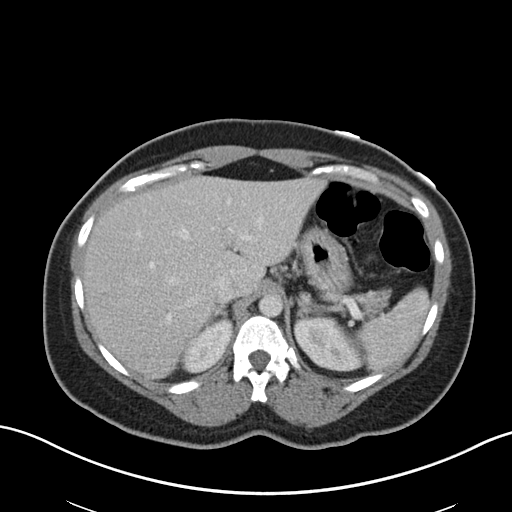
[im 81/86  soft-tissue]
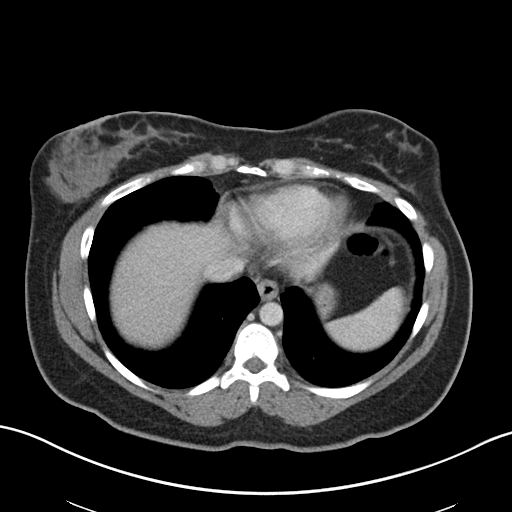

[Series 5: coronal st · coronal · 0.69mm/px · 3 of 147 slices shown]
[im 49/147  soft-tissue]
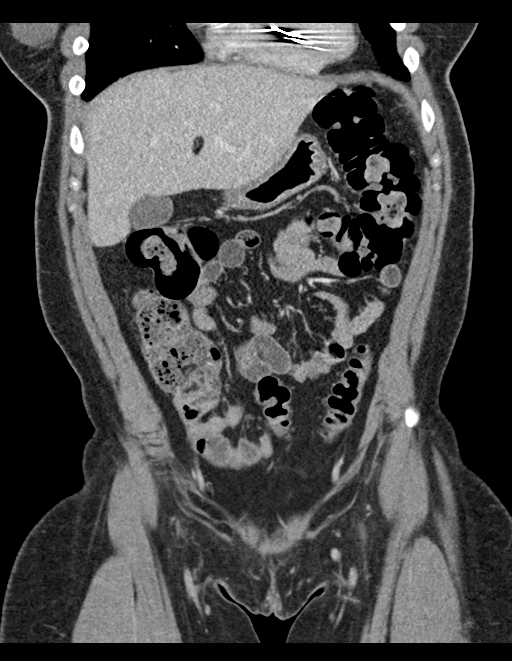
[im 65/147  soft-tissue]
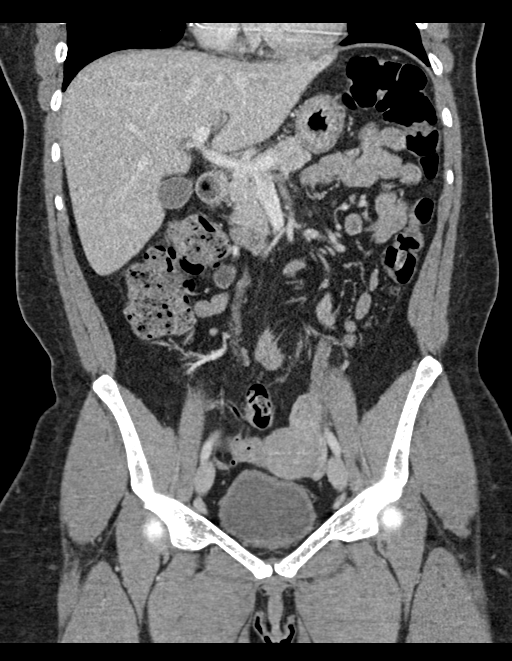
[im 82/147  soft-tissue]
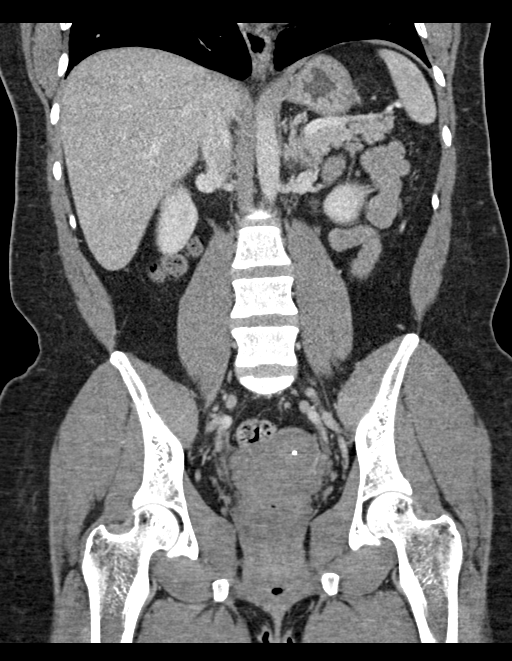

[16 of 46 positions shown; findings below may reference images not displayed]

FINDINGS: Lower chest: The lung bases are clear. The heart size is normal.

Hepatobiliary: The liver is normal. Normal gallbladder.There is no
biliary ductal dilation.

Pancreas: Normal contours without ductal dilatation. No
peripancreatic fluid collection.

Spleen: Unremarkable.

Adrenals/Urinary Tract:

--Adrenal glands: Unremarkable.

--Right kidney/ureter: No hydronephrosis or radiopaque kidney
stones.

--Left kidney/ureter: No hydronephrosis or radiopaque kidney stones.

--Urinary bladder: Unremarkable.

Stomach/Bowel:

--Stomach/Duodenum: No hiatal hernia or other gastric abnormality.
Normal duodenal course and caliber.

--Small bowel: Unremarkable.

--Colon: Unremarkable.

--Appendix: Normal.

Vascular/Lymphatic: Normal course and caliber of the major abdominal
vessels.

--No retroperitoneal lymphadenopathy.

--No mesenteric lymphadenopathy.

--No pelvic or inguinal lymphadenopathy.

Reproductive: There is an IUD in place.

Other: No ascites or free air. There is a small fat containing
umbilical hernia.

Musculoskeletal. No acute displaced fractures.
IMPRESSION: 1. No CT findings to explain the patient's abdominal pain.
2. Normal appendix.
3. Small fat containing umbilical hernia.

## 2021-08-21 ENCOUNTER — Other Ambulatory Visit (INDEPENDENT_AMBULATORY_CARE_PROVIDER_SITE_OTHER): Payer: BC Managed Care – PPO

## 2021-08-21 ENCOUNTER — Other Ambulatory Visit (INDEPENDENT_AMBULATORY_CARE_PROVIDER_SITE_OTHER): Payer: Self-pay | Admitting: Primary Care

## 2021-08-21 DIAGNOSIS — E890 Postprocedural hypothyroidism: Secondary | ICD-10-CM

## 2021-08-22 ENCOUNTER — Other Ambulatory Visit (INDEPENDENT_AMBULATORY_CARE_PROVIDER_SITE_OTHER): Payer: BC Managed Care – PPO

## 2021-08-22 ENCOUNTER — Other Ambulatory Visit: Payer: Self-pay

## 2021-08-23 ENCOUNTER — Other Ambulatory Visit (INDEPENDENT_AMBULATORY_CARE_PROVIDER_SITE_OTHER): Payer: Self-pay | Admitting: Primary Care

## 2021-08-23 ENCOUNTER — Other Ambulatory Visit: Payer: Self-pay

## 2021-08-23 DIAGNOSIS — E890 Postprocedural hypothyroidism: Secondary | ICD-10-CM

## 2021-08-23 DIAGNOSIS — I10 Essential (primary) hypertension: Secondary | ICD-10-CM

## 2021-08-23 DIAGNOSIS — Z760 Encounter for issue of repeat prescription: Secondary | ICD-10-CM

## 2021-08-23 LAB — TSH+FREE T4
Free T4: 2.12 ng/dL — ABNORMAL HIGH (ref 0.82–1.77)
TSH: 0.025 u[IU]/mL — ABNORMAL LOW (ref 0.450–4.500)

## 2021-08-23 MED ORDER — LEVOTHYROXINE SODIUM 200 MCG PO TABS
200.0000 ug | ORAL_TABLET | Freq: Every day | ORAL | 1 refills | Status: DC
Start: 2021-08-23 — End: 2022-02-12
  Filled 2021-08-23: qty 60, 60d supply, fill #0
  Filled 2021-09-01 – 2021-10-16 (×3): qty 30, 30d supply, fill #0
  Filled 2021-11-23: qty 30, 30d supply, fill #1
  Filled 2022-01-04: qty 30, 30d supply, fill #2

## 2021-08-30 ENCOUNTER — Other Ambulatory Visit: Payer: Self-pay

## 2021-09-01 ENCOUNTER — Other Ambulatory Visit: Payer: Self-pay

## 2021-09-04 ENCOUNTER — Other Ambulatory Visit: Payer: Self-pay

## 2021-09-11 NOTE — Progress Notes (Signed)
This encounter was created in error - please disregard.

## 2021-09-20 ENCOUNTER — Other Ambulatory Visit: Payer: Self-pay

## 2021-09-27 ENCOUNTER — Ambulatory Visit (INDEPENDENT_AMBULATORY_CARE_PROVIDER_SITE_OTHER): Payer: BC Managed Care – PPO

## 2021-09-27 DIAGNOSIS — Z950 Presence of cardiac pacemaker: Secondary | ICD-10-CM

## 2021-09-27 LAB — CUP PACEART REMOTE DEVICE CHECK
Battery Remaining Longevity: 138 mo
Battery Remaining Percentage: 100 %
Brady Statistic RA Percent Paced: 0 %
Brady Statistic RV Percent Paced: 0 %
Date Time Interrogation Session: 20230104031500
Implantable Lead Implant Date: 20060119
Implantable Lead Implant Date: 20060119
Implantable Lead Location: 753859
Implantable Lead Location: 753860
Implantable Lead Model: 4456
Implantable Lead Model: 4469
Implantable Lead Serial Number: 439053
Implantable Lead Serial Number: 457210
Implantable Pulse Generator Implant Date: 20180926
Lead Channel Impedance Value: 420 Ohm
Lead Channel Impedance Value: 428 Ohm
Lead Channel Pacing Threshold Amplitude: 0.4 V
Lead Channel Pacing Threshold Amplitude: 0.9 V
Lead Channel Pacing Threshold Pulse Width: 0.4 ms
Lead Channel Pacing Threshold Pulse Width: 0.4 ms
Lead Channel Setting Pacing Amplitude: 2 V
Lead Channel Setting Pacing Amplitude: 2.5 V
Lead Channel Setting Pacing Pulse Width: 0.4 ms
Lead Channel Setting Sensing Sensitivity: 2.5 mV
Pulse Gen Serial Number: 789100

## 2021-10-09 ENCOUNTER — Ambulatory Visit (HOSPITAL_COMMUNITY)
Admission: RE | Admit: 2021-10-09 | Discharge: 2021-10-09 | Disposition: A | Discharge status: Home / Self Care | Payer: BC Managed Care – PPO | Source: Ambulatory Visit | Attending: Internal Medicine | Admitting: Internal Medicine

## 2021-10-09 ENCOUNTER — Other Ambulatory Visit: Payer: Self-pay

## 2021-10-09 ENCOUNTER — Encounter (HOSPITAL_COMMUNITY): Payer: Self-pay

## 2021-10-09 VITALS — BP 134/99 | HR 87 | Temp 98.5°F | Resp 16

## 2021-10-09 DIAGNOSIS — J029 Acute pharyngitis, unspecified: Secondary | ICD-10-CM

## 2021-10-09 LAB — POCT RAPID STREP A, ED / UC: Streptococcus, Group A Screen (Direct): NEGATIVE

## 2021-10-09 MED ORDER — IPRATROPIUM BROMIDE 0.03 % NA SOLN
2.0000 | Freq: Two times a day (BID) | NASAL | 12 refills | Status: DC
  Filled 2021-10-09 – 2021-10-16 (×2): qty 30, 75d supply, fill #0

## 2021-10-09 MED ORDER — SALINE SPRAY 0.65 % NA SOLN: 1.0000 | NASAL | 0 refills | Status: DC | PRN

## 2021-10-09 MED ORDER — BENZONATATE 100 MG PO CAPS
100.0000 mg | ORAL_CAPSULE | Freq: Three times a day (TID) | ORAL | 0 refills | Status: DC
  Filled 2021-10-09: qty 21, 7d supply, fill #0

## 2021-10-09 MED ORDER — LIDOCAINE VISCOUS HCL 2 % MT SOLN
15.0000 mL | OROMUCOSAL | 0 refills | Status: DC | PRN
  Filled 2021-10-09 – 2021-10-16 (×2): qty 100, 6d supply, fill #0

## 2021-10-09 NOTE — Progress Notes (Signed)
Remote pacemaker transmission.   

## 2021-10-09 NOTE — ED Provider Notes (Signed)
Jean Rowe    CSN: [id number] Arrival date & time: 10/09/21  1059      History   Chief Complaint Chief Complaint  Patient presents with   Nasal Congestion   Cough    HPI Jean Rowe is a 36 y.o. female comes to the urgent care with 1 week history of nasal congestion, nonproductive cough and a sore throat.  Patient's symptoms started insidiously and has been persistent.  She tested negative for COVID-19 using the home COVID test.  She works in a daycare and endorses sick contacts.  No fever or chills.  Patient denies any shortness of breath or wheezing.  Cough is not productive of sputum.  Patient has tried Chloraseptic throat spray for the sore throat with no improvement in symptoms.  She is also experiencing postnasal drainage.   HPI  Past Medical History:  Diagnosis Date   Acne    Anxiety    panic attacks   COVID-19 virus infection 09/2020   Daily headache    Heart murmur    History of IBS    History of kidney infection    Hypothyroidism    Morbid obesity ([place])    Neurocardiogenic syncope    Neurocardiogenic syncope    Orthopnoea    Pacemaker 09/2004   for neurocardio syncope   Panic attacks    Symptomatic bradycardia    Thyroid disease     Patient Active Problem List   Diagnosis Date Noted   Morbid obesity ([place])    COVID-19 virus infection    Fibromyalgia 10/16/2018   Irritable bowel syndrome 10/16/2018   Pacemaker battery depletion    Paresthesia 09/01/2014   Postsurgical hypothyroidism 12/10/2013   Goiter, nontoxic, 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   Neurocardiogenic syncope 06/23/2013   Pacemaker - [employer] 2006 06/23/2013    Past Surgical History:  Procedure Laterality Date   DILATION AND CURETTAGE OF UTERUS  2013   INSERT / REPLACE / REMOVE PACEMAKER  09/2004   PPM GENERATOR CHANGEOUT N/A 06/19/2017   Procedure: PPM GENERATOR CHANGEOUT;  Surgeon: Sanda Klein, MD;  Location: [facility];  Service:  Cardiovascular;  Laterality: N/A;   THYROIDECTOMY  11/04/2013   THYROIDECTOMY N/A 11/04/2013   Procedure: THYROIDECTOMY;  Surgeon: Adin Hector, MD;  Location: [facility];  Service: General;  Laterality: N/A;    OB History     Gravida  2   Para  2   Term      Preterm      AB      Living  2      SAB      IAB      Ectopic      Multiple      Live Births               Home Medications    Prior to Admission medications   Medication Sig Start Date End Date Taking? Authorizing Provider  benzonatate (TESSALON) 100 MG capsule Take 1 capsule (100 mg total) by mouth every 8 (eight) hours. 10/09/21  Yes Bron Snellings, Myrene Galas, MD  cyclobenzaprine (FLEXERIL) 10 MG tablet Take 1 tablet (10 mg total) by mouth at bedtime. Take 1 pill for muscle spasm at bedtime as needed 06/19/21 06/19/22 Yes Kerin Perna, NP  ipratropium (ATROVENT) 0.03 % nasal spray Place 2 sprays into both nostrils every 12 (twelve) hours. 10/09/21  Yes Mauriana Dann, Myrene Galas, MD  levothyroxine (SYNTHROID) 200 MCG tablet Take 1 tablet (200 mcg total) by  mouth daily before breakfast. 08/23/21  Yes Kerin Perna, NP  lidocaine (XYLOCAINE) 2 % solution Use as directed 15 mLs in the mouth or throat as needed for mouth pain. 10/09/21  Yes Darianne Muralles, Myrene Galas, MD  sodium chloride (OCEAN) 0.65 % SOLN nasal spray Place 1 spray into both nostrils as needed for congestion. 10/09/21  Yes Connie Lasater, Myrene Galas, MD  amLODipine (NORVASC) 5 MG tablet Take 1 tablet (5 mg total) by mouth daily. 06/19/21   Kerin Perna, NP  celecoxib (CELEBREX) 100 MG capsule Take 1 capsule (100 mg total) by mouth 2 (two) times daily. 06/19/21   Kerin Perna, NP  lisinopril-hydrochlorothiazide (ZESTORETIC) 20-25 MG tablet TAKE 1 TABLET BY MOUTH DAILY. 06/19/21 06/19/22  Kerin Perna, NP  loperamide (IMODIUM) 2 MG capsule Take 1 capsule (2 mg total) by mouth 4 (four) times daily as needed for diarrhea or loose stools. 07/25/21   Hazel Sams, PA-C  ondansetron (ZOFRAN ODT) 8 MG disintegrating tablet Take 1 tablet (8 mg total) by mouth every 8 (eight) hours as needed for nausea or vomiting. 07/25/21   Hazel Sams, PA-C  promethazine-dextromethorphan (PROMETHAZINE-DM) 6.25-15 MG/5ML syrup Take 5 mLs by mouth 4 (four) times daily as needed for cough. 07/25/21   Hazel Sams, PA-C    Family History Family History  Problem Relation Age of Onset   Thyroid disease Mother    Heart disease Mother    Thyroid disease Maternal Uncle    Thyroid disease Maternal Grandmother    Heart failure Maternal Grandmother    Hypertension Maternal Grandmother    Hypertension Maternal Grandfather     Social History Social History   Tobacco Use   Smoking status: Every Day    Types: Cigars   Smokeless tobacco: Never   Tobacco comments:    11/04/2013 "smoke 3 black N milds/day"  Substance Use Topics   Alcohol use: Yes    Comment: 11/04/2013 "mixed drinks or wine once a month   Drug use: No    Types: Marijuana    Comment: none for 6 mos. since 03/2014     Allergies   Banana, Other, Metronidazole, Latex, and Penicillins   Review of Systems Review of Systems  Constitutional: Negative.   HENT:  Positive for congestion, postnasal drip and sore throat. Negative for sinus pressure and sinus pain.   Respiratory: Negative.    Cardiovascular: Negative.   Gastrointestinal: Negative.   Neurological: Negative.  Negative for headaches.    Physical Exam Triage Vital Signs ED Triage Vitals  Enc Vitals Group     BP 10/09/21 1115 (!) 134/99     Pulse Rate 10/09/21 1115 87     Resp 10/09/21 1115 16     Temp 10/09/21 1115 98.5 F (36.9 C)     Temp Source 10/09/21 1115 Oral     SpO2 10/09/21 1115 99 %     Weight --      Height --      Head Circumference --      Peak Flow --      Pain Score 10/09/21 1113 8     Pain Loc --      Pain Edu? --      Excl. in [place]? --    No data found.  Updated Vital Signs BP (!) 134/99    Pulse 87     Temp 98.5 F (36.9 C) (Oral)    Resp 16    SpO2 99%   Visual  Acuity Right Eye Distance:   Left Eye Distance:   Bilateral Distance:    Right Eye Near:   Left Eye Near:    Bilateral Near:     Physical Exam Vitals and nursing note reviewed.  Constitutional:      General: She is not in acute distress.    Appearance: She is not ill-appearing.  HENT:     Right Ear: Tympanic membrane normal.     Left Ear: Tympanic membrane normal.     Mouth/Throat:     Mouth: Mucous membranes are moist.     Pharynx: No oropharyngeal exudate or posterior oropharyngeal erythema.  Eyes:     General:        Right eye: No discharge.        Left eye: No discharge.     Pupils: Pupils are equal, round, and reactive to light.  Cardiovascular:     Rate and Rhythm: Normal rate and regular rhythm.     Pulses: Normal pulses.     Heart sounds: Normal heart sounds.  Pulmonary:     Effort: Pulmonary effort is normal. No respiratory distress.     Breath sounds: Normal breath sounds. No wheezing or rhonchi.  Abdominal:     General: Bowel sounds are normal.     Palpations: Abdomen is soft.  Neurological:     Mental Status: She is alert.     UC Treatments / Results  Labs (all labs ordered are listed, but only abnormal results are displayed) Labs Reviewed  CULTURE, GROUP A STREP [facility])  POCT RAPID STREP A, ED / UC    EKG   Radiology No results found.  Procedures Procedures (including critical care time)  Medications Ordered in UC Medications - No data to display  Initial Impression / Assessment and Plan / UC Course  I have reviewed the triage vital signs and the nursing notes.  Pertinent labs & imaging results that were available during my care of the patient were reviewed by me and considered in my medical decision making (see chart for details).     1.  Acute viral pharyngitis: Warm salt water gargle No indication for COVID or flu testing Tessalon Perles as needed for  cough Point-of-care strep test is negative Throat cultures have been sent No indication for COVID or flu testing Return to urgent care if symptoms worsen. Final Clinical Impressions(s) / UC Diagnoses   Final diagnoses:  Acute viral pharyngitis     Discharge Instructions      Salt water gargle Chloraseptic throat spray Maintain adequate hydration No indication for flu or COVID testing given the duration of your symptoms Ibuprofen as needed for pain Return to urgent care if symptoms worsen.   ED Prescriptions     Medication Sig Dispense Auth. Provider   ipratropium (ATROVENT) 0.03 % nasal spray Place 2 sprays into both nostrils every 12 (twelve) hours. 30 mL Dondra Rhett, Myrene Galas, MD   sodium chloride (OCEAN) 0.65 % SOLN nasal spray Place 1 spray into both nostrils as needed for congestion. -- Chase Picket, MD   benzonatate (TESSALON) 100 MG capsule Take 1 capsule (100 mg total) by mouth every 8 (eight) hours. 21 capsule Mira Balon, Myrene Galas, MD   lidocaine (XYLOCAINE) 2 % solution Use as directed 15 mLs in the mouth or throat as needed for mouth pain. 100 mL Annetta Deiss, Myrene Galas, MD      PDMP not reviewed this encounter.   Chase Picket, MD 10/09/21 1250

## 2021-10-09 NOTE — ED Triage Notes (Signed)
Cough, congestion, sore throat for 1 week. Had negative covid test last week.

## 2021-10-09 NOTE — Discharge Instructions (Addendum)
Salt water gargle Chloraseptic throat spray Maintain adequate hydration No indication for flu or COVID testing given the duration of your symptoms Ibuprofen as needed for pain Return to urgent care if symptoms worsen.

## 2021-10-12 LAB — CULTURE, GROUP A STREP (THRC)

## 2021-10-16 ENCOUNTER — Other Ambulatory Visit: Payer: Self-pay

## 2021-10-23 ENCOUNTER — Other Ambulatory Visit: Payer: Self-pay

## 2021-11-20 ENCOUNTER — Telehealth: Payer: Self-pay

## 2021-11-20 NOTE — Telephone Encounter (Signed)
I would like to see her in the office- can do on March 13. How urgent is the letter?

## 2021-11-20 NOTE — Telephone Encounter (Signed)
The patient called stating she needs a document stating she has not been having any issues with her pacemaker battery since she had it. She needs the document for [employer] paperwork. I told her I will have Dr. Sallyanne Kuster nurse to give you a call back.

## 2021-11-20 NOTE — Telephone Encounter (Signed)
The patient called in stating that she needs a letter for [employer] stating that she has not had any problems with her pacemaker, the battery life, and that she has no restrictions.  She has been advised that she has not been seen in 3 years so she may need to be seen before this can be completed.

## 2021-11-21 NOTE — Telephone Encounter (Signed)
Patient aware. Appointment made for 3/16 with Dr. Sallyanne Kuster

## 2021-11-23 ENCOUNTER — Other Ambulatory Visit: Payer: Self-pay

## 2021-11-27 ENCOUNTER — Other Ambulatory Visit: Payer: Self-pay

## 2021-12-07 ENCOUNTER — Encounter: Payer: BC Managed Care – PPO | Admitting: Cardiovascular Disease

## 2021-12-27 ENCOUNTER — Ambulatory Visit (INDEPENDENT_AMBULATORY_CARE_PROVIDER_SITE_OTHER): Payer: BC Managed Care – PPO

## 2021-12-27 DIAGNOSIS — Z950 Presence of cardiac pacemaker: Secondary | ICD-10-CM | POA: Diagnosis not present

## 2021-12-27 LAB — CUP PACEART REMOTE DEVICE CHECK
Battery Remaining Longevity: 138 mo
Battery Remaining Percentage: 100 %
Brady Statistic RA Percent Paced: 0 %
Brady Statistic RV Percent Paced: 0 %
Date Time Interrogation Session: 20230405030100
Implantable Lead Implant Date: 20060119
Implantable Lead Implant Date: 20060119
Implantable Lead Location: 753859
Implantable Lead Location: 753860
Implantable Lead Model: 4456
Implantable Lead Model: 4469
Implantable Lead Serial Number: 439053
Implantable Lead Serial Number: 457210
Implantable Pulse Generator Implant Date: 20180926
Lead Channel Impedance Value: 446 Ohm
Lead Channel Impedance Value: 447 Ohm
Lead Channel Pacing Threshold Amplitude: 0.8 V
Lead Channel Pacing Threshold Pulse Width: 0.4 ms
Lead Channel Setting Pacing Amplitude: 2 V
Lead Channel Setting Pacing Amplitude: 2.5 V
Lead Channel Setting Pacing Pulse Width: 0.4 ms
Lead Channel Setting Sensing Sensitivity: 2.5 mV
Pulse Gen Serial Number: 789100

## 2022-01-01 ENCOUNTER — Encounter: Payer: BC Managed Care – PPO | Admitting: Cardiovascular Disease

## 2022-01-04 ENCOUNTER — Other Ambulatory Visit: Payer: Self-pay

## 2022-01-04 ENCOUNTER — Other Ambulatory Visit (INDEPENDENT_AMBULATORY_CARE_PROVIDER_SITE_OTHER): Payer: Self-pay | Admitting: Primary Care

## 2022-01-04 DIAGNOSIS — M797 Fibromyalgia: Secondary | ICD-10-CM

## 2022-01-04 NOTE — Telephone Encounter (Signed)
Sent to PCP ?

## 2022-01-05 ENCOUNTER — Encounter: Payer: Self-pay | Admitting: Cardiovascular Disease

## 2022-01-08 ENCOUNTER — Other Ambulatory Visit (INDEPENDENT_AMBULATORY_CARE_PROVIDER_SITE_OTHER): Payer: Self-pay | Admitting: Primary Care

## 2022-01-08 DIAGNOSIS — M797 Fibromyalgia: Secondary | ICD-10-CM

## 2022-01-09 ENCOUNTER — Other Ambulatory Visit: Payer: Self-pay

## 2022-01-10 ENCOUNTER — Other Ambulatory Visit: Payer: Self-pay

## 2022-01-11 NOTE — Progress Notes (Signed)
Remote pacemaker transmission.   

## 2022-01-16 ENCOUNTER — Other Ambulatory Visit (INDEPENDENT_AMBULATORY_CARE_PROVIDER_SITE_OTHER): Payer: Self-pay | Admitting: Primary Care

## 2022-01-16 DIAGNOSIS — M797 Fibromyalgia: Secondary | ICD-10-CM

## 2022-01-17 ENCOUNTER — Other Ambulatory Visit: Payer: Self-pay

## 2022-01-20 ENCOUNTER — Other Ambulatory Visit (INDEPENDENT_AMBULATORY_CARE_PROVIDER_SITE_OTHER): Payer: Self-pay | Admitting: Primary Care

## 2022-01-20 DIAGNOSIS — M797 Fibromyalgia: Secondary | ICD-10-CM

## 2022-01-22 NOTE — Telephone Encounter (Signed)
Requested medications are due for refill today.  yes ? ?Requested medications are on the active medications list.  yes ? ?Last refill. 06/19/2021 #90 1 refill ? ?Future visit scheduled.   no ? ?Notes to clinic.  Medication refill is not delegated. ? ? ? ?Requested Prescriptions  ?Pending Prescriptions Disp Refills  ? cyclobenzaprine (FLEXERIL) 10 MG tablet 90 tablet 1  ?  Sig: Take 1 tablet (10 mg total) by mouth at bedtime for muscle spasm  ?  ? Not Delegated - Analgesics:  Muscle Relaxants Failed - 01/20/2022  1:08 AM  ?  ?  Failed - This refill cannot be delegated  ?  ?  Failed - Valid encounter within last 6 months  ?  Recent Outpatient Visits   ? ?      ? 7 months ago Postsurgical hypothyroidism  ? [facility] Grayce Sessions, NP  ? 11 months ago Muscle spasm of back  ? [facility] Grayce Sessions, NP  ? 1 year ago Postsurgical hypothyroidism  ? [facility] Grayce Sessions, NP  ? 1 year ago Essential hypertension  ? [facility] Grayce Sessions, NP  ? 1 year ago Burning with urination  ? [facility] Grayce Sessions, NP  ? ?  ?  ? ? ?  ?  ?  ?  ?

## 2022-01-22 NOTE — Telephone Encounter (Signed)
Called pt - LMOM to return call to set up appointment. ?

## 2022-01-24 ENCOUNTER — Other Ambulatory Visit: Payer: Self-pay

## 2022-01-25 ENCOUNTER — Other Ambulatory Visit (INDEPENDENT_AMBULATORY_CARE_PROVIDER_SITE_OTHER): Payer: Self-pay | Admitting: Primary Care

## 2022-01-25 DIAGNOSIS — M797 Fibromyalgia: Secondary | ICD-10-CM

## 2022-01-25 NOTE — Telephone Encounter (Signed)
Requested medication (s) are due for refill today: yes ? ?Requested medication (s) are on the active medication list: yes ? ?Last refill:  06/19/21 #90/1 ? ?Future visit scheduled: yes 02/26/22 ? ?Notes to clinic:  Unable to refill per protocol, cannot delegate. ? ?  ?Requested Prescriptions  ?Pending Prescriptions Disp Refills  ? cyclobenzaprine (FLEXERIL) 10 MG tablet 90 tablet 1  ?  Sig: Take 1 tablet (10 mg total) by mouth at bedtime for muscle spasm  ?  ? Not Delegated - Analgesics:  Muscle Relaxants Failed - 01/25/2022  4:44 PM  ?  ?  Failed - This refill cannot be delegated  ?  ?  Failed - Valid encounter within last 6 months  ?  Recent Outpatient Visits   ? ?      ? 7 months ago Postsurgical hypothyroidism  ? [facility] Grayce Sessions, NP  ? 11 months ago Muscle spasm of back  ? [facility] Grayce Sessions, NP  ? 1 year ago Postsurgical hypothyroidism  ? [facility] Grayce Sessions, NP  ? 1 year ago Essential hypertension  ? [facility] Grayce Sessions, NP  ? 1 year ago Burning with urination  ? [facility] Grayce Sessions, NP  ? ?  ?  ?Future Appointments   ? ?        ? In 1 month Edwards, Kinnie Scales, NP [facility]  ? ?  ? ? ?  ?  ?  ? ?

## 2022-01-26 ENCOUNTER — Other Ambulatory Visit: Payer: Self-pay

## 2022-01-29 ENCOUNTER — Other Ambulatory Visit: Payer: Self-pay

## 2022-01-29 ENCOUNTER — Other Ambulatory Visit (INDEPENDENT_AMBULATORY_CARE_PROVIDER_SITE_OTHER): Payer: Self-pay | Admitting: Primary Care

## 2022-01-29 DIAGNOSIS — M797 Fibromyalgia: Secondary | ICD-10-CM

## 2022-02-12 ENCOUNTER — Other Ambulatory Visit: Payer: Self-pay

## 2022-02-12 ENCOUNTER — Encounter (INDEPENDENT_AMBULATORY_CARE_PROVIDER_SITE_OTHER): Payer: Self-pay | Admitting: Primary Care

## 2022-02-12 ENCOUNTER — Ambulatory Visit (INDEPENDENT_AMBULATORY_CARE_PROVIDER_SITE_OTHER): Payer: BC Managed Care – PPO | Admitting: Primary Care

## 2022-02-12 VITALS — BP 145/99 | HR 72 | Temp 97.7°F | Ht 61.0 in | Wt 186.8 lb

## 2022-02-12 DIAGNOSIS — M62830 Muscle spasm of back: Secondary | ICD-10-CM | POA: Diagnosis not present

## 2022-02-12 DIAGNOSIS — E890 Postprocedural hypothyroidism: Secondary | ICD-10-CM

## 2022-02-12 DIAGNOSIS — N912 Amenorrhea, unspecified: Secondary | ICD-10-CM | POA: Diagnosis not present

## 2022-02-12 DIAGNOSIS — I10 Essential (primary) hypertension: Secondary | ICD-10-CM

## 2022-02-12 DIAGNOSIS — Z760 Encounter for issue of repeat prescription: Secondary | ICD-10-CM

## 2022-02-12 MED ORDER — CYCLOBENZAPRINE HCL 10 MG PO TABS
10.0000 mg | ORAL_TABLET | Freq: Three times a day (TID) | ORAL | 1 refills | Status: DC | PRN
  Filled 2022-02-12: qty 90, 30d supply, fill #0
  Filled 2022-05-07: qty 90, 30d supply, fill #1

## 2022-02-12 MED ORDER — LEVOTHYROXINE SODIUM 200 MCG PO TABS
200.0000 ug | ORAL_TABLET | Freq: Every day | ORAL | 0 refills | Status: DC
  Filled 2022-02-12: qty 30, 30d supply, fill #0

## 2022-02-12 NOTE — Progress Notes (Signed)
[place]  Jean Rowe, is a 36 y.o. female  NMM:[id number]  RPR:[id number]  DOB - 1986-07-13  Chief Complaint  Patient presents with   Medication Refill    Thyroid and flexeril        Subjective:   Jean Rowe is a 36 y.o. female here today for a follow up visit ongoing her hypothyroidism and chronic back pain with muscle spasms.  Requesting refill on her Flexeril.  Patient has No headache, No chest pain, No abdominal pain - No Nausea, No new weakness tingling or numbness, No Cough - shortness of breath  No problems updated.  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     Past Medical History:  Diagnosis Date   Acne    Anxiety    panic attacks   COVID-19 virus infection 09/2020   Daily headache    Heart murmur    History of IBS    History of kidney infection    Hypothyroidism    Morbid obesity ([place])    Neurocardiogenic syncope    Neurocardiogenic syncope    Orthopnoea    Pacemaker 09/2004   for neurocardio syncope   Panic attacks    Symptomatic bradycardia    Thyroid disease     Current Outpatient Medications on File Prior to Visit  Medication Sig Dispense Refill   celecoxib (CELEBREX) 100 MG capsule Take 1 capsule (100 mg total) by mouth 2 (two) times daily.  (Patient not taking: Reported on 02/12/2022) 180 capsule 1   No current facility-administered medications on file prior to visit.  Review of Systems  Musculoskeletal:  Positive for back pain.       Chronic /spasm  All other systems reviewed and are negative.   Objective:   Vitals:   02/12/22 1538  BP: (!) 145/99  Pulse: 72  Temp: 97.7 F (36.5 C)  TempSrc: Oral  SpO2: 99%  Weight: 186 lb 12.8 oz (84.7 kg)  Height: _0  (1.549 m)    Exam General appearance : Awake, alert, not in any distress. Speech Clear. Not toxic looking HEENT: Atraumatic and Normocephalic, pupils equally reactive to light and accomodation Neck: Supple, no JVD. No cervical lymphadenopathy.  Chest: Good air entry bilaterally, no added sounds  CVS: S1 S2 regular, no murmurs.  Abdomen: Bowel sounds present, Non tender and not distended with no gaurding, rigidity or rebound. Extremities: B/L Lower Ext shows no edema, both legs are warm to touch Neurology: Awake alert, and oriented X 3, CN II-XII intact, Non focal Skin: No Rash  Data Review No results found for: HGBA1C  Assessment & Plan   1. Postsurgical hypothyroidism - TSH + free T4  2. Essential hypertension BP goal - < 130/80 Explained that having normal blood pressure is the goal and medications are helping to get  to goal and maintain normal blood pressure. DIET: Limit salt intake, read nutrition labels to check salt content, limit fried and high fatty foods  Avoid using multisymptom OTC cold preparations that generally contain sudafed which can rise BP. Consult with pharmacist on best cold relief products to use for persons with HTN EXERCISE Discussed incorporating exercise such as walking - 30 minutes most days of the week and can do in 10 minute intervals    - CMP14+EGFR  3. Amenorrhea - CBC with Differential  4. Muscle spasm of back Chronic refill on Flexeril    Patient have been counseled extensively about nutrition and exercise.  Other issues discussed during this visit include: low cholesterol diet, weight control and daily exercise, foot care, annual eye examinations at Ophthalmology, importance of adherence with medications and regular follow-up. We also discussed long term complications of uncontrolled diabetes and hypertension.   Return in about 6 weeks (around 03/26/2022) for thyroid/bp.  The patient was given clear instructions to go to ER or return to medical center if symptoms don't improve, worsen or new problems develop. The patient verbalized understanding. The patient was told to call to get lab results if they haven't heard anything in the next week.   This note has been created with Surveyor, quantity. Any transcriptional errors are unintentional.   Kerin Perna, NP 02/19/2022, 8:46 PM

## 2022-02-13 ENCOUNTER — Other Ambulatory Visit: Payer: Self-pay

## 2022-02-13 LAB — CMP14+EGFR
ALT: 19 IU/L (ref 0–32)
AST: 22 IU/L (ref 0–40)
Albumin/Globulin Ratio: 1.7 (ref 1.2–2.2)
Albumin: 4.5 g/dL (ref 3.8–4.8)
Alkaline Phosphatase: 95 IU/L (ref 44–121)
BUN/Creatinine Ratio: 10 (ref 9–23)
BUN: 9 mg/dL (ref 6–20)
Bilirubin Total: <0.2 mg/dL (ref 0.0–1.2)
CO2: 22 mmol/L (ref 20–29)
Calcium: 9 mg/dL (ref 8.7–10.2)
Chloride: 105 mmol/L (ref 96–106)
Creatinine, Ser: 0.94 mg/dL (ref 0.57–1.00)
Globulin, Total: 2.7 g/dL (ref 1.5–4.5)
Glucose: 84 mg/dL (ref 70–99)
Potassium: 4.5 mmol/L (ref 3.5–5.2)
Sodium: 140 mmol/L (ref 134–144)
Total Protein: 7.2 g/dL (ref 6.0–8.5)
eGFR: 81 mL/min/{1.73_m2} (ref >59)

## 2022-02-13 LAB — CBC WITH DIFFERENTIAL/PLATELET
Basophils Absolute: 0 10*3/uL (ref 0.0–0.2)
Basos: 0 %
EOS (ABSOLUTE): 0.1 10*3/uL (ref 0.0–0.4)
Eos: 1 %
Hematocrit: 41.4 % (ref 34.0–46.6)
Hemoglobin: 13.3 g/dL (ref 11.1–15.9)
Immature Grans (Abs): 0 10*3/uL (ref 0.0–0.1)
Immature Granulocytes: 0 %
Lymphocytes Absolute: 3.1 10*3/uL (ref 0.7–3.1)
Lymphs: 29 %
MCH: 29.8 pg (ref 26.6–33.0)
MCHC: 32.1 g/dL (ref 31.5–35.7)
MCV: 93 fL (ref 79–97)
Monocytes Absolute: 0.5 10*3/uL (ref 0.1–0.9)
Monocytes: 5 %
Neutrophils Absolute: 7.1 10*3/uL — ABNORMAL HIGH (ref 1.4–7.0)
Neutrophils: 65 %
Platelets: 433 10*3/uL (ref 150–450)
RBC: 4.46 x10E6/uL (ref 3.77–5.28)
RDW: 12.7 % (ref 11.7–15.4)
WBC: 10.8 10*3/uL (ref 3.4–10.8)

## 2022-02-13 LAB — TSH+FREE T4
Free T4: 0.6 ng/dL — ABNORMAL LOW (ref 0.82–1.77)
TSH: 1.66 u[IU]/mL (ref 0.450–4.500)

## 2022-02-15 ENCOUNTER — Other Ambulatory Visit: Payer: Self-pay

## 2022-02-15 ENCOUNTER — Other Ambulatory Visit (INDEPENDENT_AMBULATORY_CARE_PROVIDER_SITE_OTHER): Payer: Self-pay | Admitting: Primary Care

## 2022-02-15 DIAGNOSIS — E890 Postprocedural hypothyroidism: Secondary | ICD-10-CM

## 2022-02-15 DIAGNOSIS — Z760 Encounter for issue of repeat prescription: Secondary | ICD-10-CM

## 2022-02-15 MED ORDER — LEVOTHYROXINE SODIUM 200 MCG PO TABS
200.0000 ug | ORAL_TABLET | Freq: Every day | ORAL | 0 refills | Status: DC
  Filled 2022-02-15: qty 30, 30d supply, fill #0

## 2022-02-22 ENCOUNTER — Other Ambulatory Visit: Payer: Self-pay

## 2022-02-22 ENCOUNTER — Other Ambulatory Visit (INDEPENDENT_AMBULATORY_CARE_PROVIDER_SITE_OTHER): Payer: Self-pay | Admitting: Primary Care

## 2022-02-22 DIAGNOSIS — Z760 Encounter for issue of repeat prescription: Secondary | ICD-10-CM

## 2022-02-22 DIAGNOSIS — E890 Postprocedural hypothyroidism: Secondary | ICD-10-CM

## 2022-02-22 MED ORDER — LEVOTHYROXINE SODIUM 200 MCG PO TABS
200.0000 ug | ORAL_TABLET | Freq: Every day | ORAL | 0 refills | Status: DC
  Filled 2022-02-22 – 2022-03-13 (×2): qty 30, 30d supply, fill #0

## 2022-02-26 ENCOUNTER — Ambulatory Visit (INDEPENDENT_AMBULATORY_CARE_PROVIDER_SITE_OTHER): Payer: BC Managed Care – PPO | Admitting: Primary Care

## 2022-03-13 ENCOUNTER — Other Ambulatory Visit: Payer: Self-pay

## 2022-03-15 ENCOUNTER — Other Ambulatory Visit: Payer: Self-pay

## 2022-03-16 ENCOUNTER — Other Ambulatory Visit: Payer: Self-pay

## 2022-03-19 ENCOUNTER — Telehealth: Payer: Self-pay

## 2022-03-19 NOTE — Telephone Encounter (Signed)
Informed by front staff that patient needed a form signed. 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 I explained that she has an appointment with dr. Royann Shivers on 7/24. 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 I reminded her that she had an appointment in February that she cancelled. She stated, "that was then, this is now." I further explained that it has been over three years since she has seen Dr. Royann Shivers. She asked to see a different cardiologist. Dr. Elwin Mocha made aware. Notice patient at check out desk with Robley Fries. I explained to [place] that patient is considered new, not TOC since it has been over three years since she saw cardiologist.

## 2022-03-28 ENCOUNTER — Ambulatory Visit (INDEPENDENT_AMBULATORY_CARE_PROVIDER_SITE_OTHER): Payer: BC Managed Care – PPO | Admitting: Primary Care

## 2022-03-28 ENCOUNTER — Ambulatory Visit (INDEPENDENT_AMBULATORY_CARE_PROVIDER_SITE_OTHER): Payer: BC Managed Care – PPO

## 2022-03-28 DIAGNOSIS — Z950 Presence of cardiac pacemaker: Secondary | ICD-10-CM | POA: Diagnosis not present

## 2022-03-31 LAB — CUP PACEART REMOTE DEVICE CHECK
Battery Remaining Longevity: 132 mo
Battery Remaining Percentage: 100 %
Brady Statistic RA Percent Paced: 0 %
Brady Statistic RV Percent Paced: 0 %
Date Time Interrogation Session: 20230705030200
Implantable Lead Implant Date: 20060119
Implantable Lead Implant Date: 20060119
Implantable Lead Location: 753859
Implantable Lead Location: 753860
Implantable Lead Model: 4456
Implantable Lead Model: 4469
Implantable Lead Serial Number: 439053
Implantable Lead Serial Number: 457210
Implantable Pulse Generator Implant Date: 20180926
Lead Channel Impedance Value: 418 Ohm
Lead Channel Impedance Value: 446 Ohm
Lead Channel Pacing Threshold Amplitude: 0.4 V
Lead Channel Pacing Threshold Amplitude: 1 V
Lead Channel Pacing Threshold Pulse Width: 0.4 ms
Lead Channel Pacing Threshold Pulse Width: 0.4 ms
Lead Channel Setting Pacing Amplitude: 2 V
Lead Channel Setting Pacing Amplitude: 2.5 V
Lead Channel Setting Pacing Pulse Width: 0.4 ms
Lead Channel Setting Sensing Sensitivity: 2.5 mV
Pulse Gen Serial Number: 789100

## 2022-04-02 ENCOUNTER — Encounter (INDEPENDENT_AMBULATORY_CARE_PROVIDER_SITE_OTHER): Payer: Self-pay | Admitting: Primary Care

## 2022-04-02 ENCOUNTER — Ambulatory Visit (INDEPENDENT_AMBULATORY_CARE_PROVIDER_SITE_OTHER): Payer: BC Managed Care – PPO | Admitting: Primary Care

## 2022-04-02 VITALS — BP 139/98 | HR 94 | Temp 97.9°F | Ht 61.0 in | Wt 184.2 lb

## 2022-04-02 DIAGNOSIS — I10 Essential (primary) hypertension: Secondary | ICD-10-CM

## 2022-04-02 DIAGNOSIS — E890 Postprocedural hypothyroidism: Secondary | ICD-10-CM | POA: Diagnosis not present

## 2022-04-02 DIAGNOSIS — Z760 Encounter for issue of repeat prescription: Secondary | ICD-10-CM

## 2022-04-03 ENCOUNTER — Other Ambulatory Visit (INDEPENDENT_AMBULATORY_CARE_PROVIDER_SITE_OTHER): Payer: Self-pay | Admitting: Primary Care

## 2022-04-03 ENCOUNTER — Other Ambulatory Visit: Payer: Self-pay

## 2022-04-03 DIAGNOSIS — E890 Postprocedural hypothyroidism: Secondary | ICD-10-CM

## 2022-04-03 LAB — TSH+FREE T4
Free T4: 3.18 ng/dL — ABNORMAL HIGH (ref 0.82–1.77)
TSH: 0.006 u[IU]/mL — ABNORMAL LOW (ref 0.450–4.500)

## 2022-04-03 MED ORDER — LEVOTHYROXINE SODIUM 100 MCG PO TABS
100.0000 ug | ORAL_TABLET | Freq: Every day | ORAL | 0 refills | Status: DC
Start: 2022-04-03 — End: 2022-04-05
  Filled 2022-04-03: qty 30, 30d supply, fill #0

## 2022-04-05 ENCOUNTER — Encounter: Payer: Self-pay | Admitting: Cardiovascular Disease

## 2022-04-05 ENCOUNTER — Ambulatory Visit (INDEPENDENT_AMBULATORY_CARE_PROVIDER_SITE_OTHER): Payer: BC Managed Care – PPO | Admitting: Cardiovascular Disease

## 2022-04-05 VITALS — BP 139/78 | HR 76 | Ht 60.0 in | Wt 182.4 lb

## 2022-04-05 DIAGNOSIS — R55 Syncope and collapse: Secondary | ICD-10-CM

## 2022-04-05 DIAGNOSIS — Z950 Presence of cardiac pacemaker: Secondary | ICD-10-CM | POA: Diagnosis not present

## 2022-04-05 DIAGNOSIS — E890 Postprocedural hypothyroidism: Secondary | ICD-10-CM | POA: Diagnosis not present

## 2022-04-05 NOTE — Patient Instructions (Signed)
Medication Instructions:  ,No Changes In Medications at this time.  *If you need a refill on your cardiac medications before your next appointment, please call your pharmacy*  Lab Work None Ordered At This Time.   If you have labs (blood work) drawn today and your tests are completely normal, you will receive your results only by: MyChart Message (if you have MyChart) OR A paper copy in the mail If you have any lab test that is abnormal or we need to change your treatment, we will call you to review the results.  Testing/Procedures: None Ordered At This Time.   Follow-Up: At [facility] you and your health needs are our priority.  As part of our continuing mission to provide you with exceptional heart care, we have created designated Provider Care Teams.  These Care Teams include your primary Cardiologist (physician) and Advanced Practice Providers (APPs -  Physician Assistants and Nurse Practitioners) who all work together to provide you with the care you need, when you need it.  Your next appointment:   1 year(s)  The format for your next appointment:   In Person  Provider:   Thurmon Fair, MD

## 2022-04-05 NOTE — Progress Notes (Signed)
Cardiology Office Note    Date:  04/05/2022   ID:  Jean Rowe, DOB 09/19/1986, MRN [id number]  PCP:  Grayce Sessions, NP  Cardiologist:   Thurmon Fair, MD   Chief Complaint  Patient presents with   Pacemaker Check  Pacemaker check/neurocardiogenic syncope  History of Present Illness:  Jean Rowe is a 36 y.o. 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   This is her first office appointment since January 2020, although we have received multiple pacemaker downloads in the meantime.  She feels well and has not had any recurrent syncope or near syncope since the device was implanted.  The device occasionally records high atrial rates, all consistent with sinus tachycardia.  She denies shortness of breath or chest pain either at rest or with activity.  She has not had palpitations or dizziness.  She denies lower extremity edema, orthopnea, PND.  Device function is normal.  Estimated generator longevity is another 11 years.  All lead parameters are within normal range.  There is automatic capture threshold testing on both atrial and ventricular leads.  The device has not recorded any episodes of atrial or ventricular high rates in the recent past.  She has less than 1% atrial pacing and ventricular pacing.  Heart rate histogram distribution is normal.  Several episodes of sudden bradycardia response were recorded on July 12  She is applying for commercial driver's license.  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   Past Medical History:  Diagnosis Date   Acne    Anxiety    panic attacks   COVID-19 virus infection 09/2020   Daily headache    Heart murmur    History of IBS     History of kidney infection    Hypothyroidism    Morbid obesity ([facility])    Neurocardiogenic syncope    Neurocardiogenic syncope    Orthopnoea    Pacemaker 09/2004   for neurocardio syncope   Panic attacks    Symptomatic bradycardia    Thyroid disease     Past Surgical History:  Procedure Laterality Date   DILATION AND CURETTAGE OF UTERUS  2013   INSERT / REPLACE / REMOVE PACEMAKER  09/2004   PPM GENERATOR CHANGEOUT N/A 06/19/2017   Procedure: PPM GENERATOR CHANGEOUT;  Surgeon: Thurmon Fair, MD;  Location: [facility] INVASIVE CV LAB;  Service: Cardiovascular;  Laterality: N/A;   THYROIDECTOMY  11/04/2013   THYROIDECTOMY N/A 11/04/2013   Procedure: THYROIDECTOMY;  Surgeon: Ernestene Mention, MD;  Location: [facility] OR;  Service: General;  Laterality: N/A;    Current Medications: Outpatient Medications Prior to Visit  Medication Sig Dispense Refill   cyclobenzaprine (FLEXERIL) 10 MG tablet Take 1 tablet (10 mg total) by mouth 3 (three) times daily as needed for muscle spasms. 90 tablet 1   doxycycline (VIBRA-TABS) 100 MG tablet Take 100 mg by mouth 2 (two) times daily.     levothyroxine (SYNTHROID) 200 MCG tablet Take 200 mcg by mouth daily before breakfast.     levothyroxine (SYNTHROID) 25 MCG tablet Take 25 mcg by mouth daily before breakfast.     tretinoin (RETIN-A) 0.025 % cream Apply 1 Application topically at bedtime.     celecoxib (CELEBREX) 100 MG capsule  Take 1 capsule (100 mg total) by mouth 2 (two) times daily. (Patient not taking: Reported on 02/12/2022) 180 capsule 1   levothyroxine (SYNTHROID) 100 MCG tablet Take 1 tablet (100 mcg total) by mouth daily. (Patient not taking: Reported on 04/05/2022) 60 tablet 0   No facility-administered medications prior to visit.     Allergies:   Banana, Other, Metronidazole, Latex, and Penicillins   Social History   Socioeconomic History   Marital status: Single    Spouse name: Not on file   Number of children: Not on file   Years of education:  Not on file   Highest education level: Not on file  Occupational History   Not on file  Tobacco Use   Smoking status: Former    Types: Cigars   Smokeless tobacco: Never   Tobacco comments:    11/04/2013 "smoke 3 black N milds/day"    2019 patient stop smoking  Substance and Sexual Activity   Alcohol use: Yes    Comment: 11/04/2013 "mixed drinks or wine once a month   Drug use: No    Types: Marijuana    Comment: none for 6 mos. since 03/2014   Sexual activity: Yes    Birth control/protection: I.U.D.  Other Topics Concern   Not on file  Social History Narrative   Epworth Sleepiness Scale = 12 (as of 10/22/2016)   Social Determinants of Health   Financial Resource Strain: Not on file  Food Insecurity: Not on file  Transportation Needs: Not on file  Physical Activity: Not on file  Stress: Not on file  Social Connections: Not on file     Family History:  The patient's family history includes Heart disease in her mother; Heart failure in her maternal grandmother; Hypertension in her maternal grandfather and maternal grandmother; Thyroid disease in her maternal grandmother, maternal uncle, and mother.   ROS:   Please see the history of present illness.    ROS All other systems reviewed and are negative.   PHYSICAL EXAM:   VS:  BP 139/78   Pulse 76   Ht 5' (1.524 m)   Wt 182 lb 6.4 oz (82.7 kg)   SpO2 99%   BMI 35.62 kg/m     General: Alert, oriented x3, no distress, healthy left subclavian pacemaker site Head: no evidence of trauma, PERRL, EOMI, no exophtalmos or lid lag, no myxedema, no xanthelasma; normal ears, nose and oropharynx Neck: normal jugular venous pulsations and no hepatojugular reflux; brisk carotid pulses without delay and no carotid bruits Chest: clear to auscultation, no signs of consolidation by percussion or palpation, normal fremitus, symmetrical and full respiratory excursions Cardiovascular: normal position and quality of the apical impulse, regular  rhythm, normal first and second heart sounds, no murmurs, rubs or gallops Abdomen: no tenderness or distention, no masses by palpation, no abnormal pulsatility or arterial bruits, normal bowel sounds, no hepatosplenomegaly Extremities: no clubbing, cyanosis or edema; 2+ radial, ulnar and brachial pulses bilaterally; 2+ right femoral, posterior tibial and dorsalis pedis pulses; 2+ left femoral, posterior tibial and dorsalis pedis pulses; no subclavian or femoral bruits Neurological: grossly nonfocal Psych: Normal mood and affect   Wt Readings from Last 3 Encounters:  04/05/22 182 lb 6.4 oz (82.7 kg)  04/02/22 184 lb 3.2 oz (83.6 kg)  02/12/22 186 lb 12.8 oz (84.7 kg)      Studies/Labs Reviewed:   EKG:  EKG is ordered today.  Shows normal sinus rhythm with prominent sinus arrhythmia otherwise normal tracing. Recent  Labs: 02/12/2022: ALT 19; BUN 9; Creatinine, Ser 0.94; Hemoglobin 13.3; Platelets 433; Potassium 4.5; Sodium 140 04/02/2022: TSH 0.006    ASSESSMENT:    1. Neurocardiogenic syncope   2. Pacemaker   3. Postsurgical hypothyroidism      PLAN:  In order of problems listed above:  Neurocardiogenic syncope: This was unsuccessfully treated with salt loading, fludrocortisone and beta-blockers but has resolved completely following pacemaker implantation.  The pacemaker will prevent cardio depressor syncope, but she could have a component of vasodepressive syncope that is still operational.  Therefore is important stay well-hydrated, avoid known triggers, promptly he prodromal symptoms and prevent syncope by laying down or crouching tightly.  PPM: Normal device function.  Remote downloads every 3 months and yearly office visits.  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  SBR programmed on fairly aggressively. Hypothyroidism: On levothyroxine supplementation.  Most recent labs performed just a couple of days ago showed suppressed TSH and  elevated free T4.  Needs to reduce the dose of levothyroxine (Discuss with PCP)    Medication Adjustments/Labs and Tests Ordered: Current medicines are reviewed at length with the patient today.  Concerns regarding medicines are outlined above.  Medication changes, Labs and Tests ordered today are listed in the Patient Instructions below. Patient Instructions  Medication Instructions:  ,No Changes In Medications at this time.  *If you need a refill on your cardiac medications before your next appointment, please call your pharmacy*  Lab Work None Ordered At This Time.   If you have labs (blood work) drawn today and your tests are completely normal, you will receive your results only by: MyChart Message (if you have MyChart) OR A paper copy in the mail If you have any lab test that is abnormal or we need to change your treatment, we will call you to review the results.  Testing/Procedures: None Ordered At This Time.   Follow-Up: At [facility], you and your health needs are our priority.  As part of our continuing mission to provide you with exceptional heart care, we have created designated Provider Care Teams.  These Care Teams include your primary Cardiologist (physician) and Advanced Practice Providers (APPs -  Physician Assistants and Nurse Practitioners) who all work together to provide you with the care you need, when you need it.  Your next appointment:   1 year(s)  The format for your next appointment:   In Person  Provider:   Thurmon Fair, MD            Signed, Thurmon Fair, MD  04/05/2022 2:46 PM    [facility] [address] [place], [place], [place]  [address] Phone: [phone]; Fax: [phone]

## 2022-04-06 ENCOUNTER — Other Ambulatory Visit: Payer: Self-pay

## 2022-04-07 ENCOUNTER — Other Ambulatory Visit (INDEPENDENT_AMBULATORY_CARE_PROVIDER_SITE_OTHER): Payer: Self-pay | Admitting: Primary Care

## 2022-04-07 MED ORDER — LEVOTHYROXINE SODIUM 100 MCG PO TABS
100.0000 ug | ORAL_TABLET | Freq: Every day | ORAL | 0 refills | Status: DC
  Filled 2022-04-07 – 2022-05-07 (×2): qty 30, 30d supply, fill #0

## 2022-04-07 NOTE — Progress Notes (Signed)
    [facility]         Subjective:     [name] is a 36 y.o. female who is following up for the management of hypothyroidism( stating I am taking my medicine now) ,Thyroid levels have been variable for a while.  Hypertension- Patient has No headache, No chest pain, No abdominal pain - No Nausea, No new weakness tingling or numbness, No Cough - shortness of breath  and requesting medication refills.  The following portions of the patient's history were reviewed and updated as appropriate: allergies, current medications, past family history, past medical history, past social history, past surgical history, and problem list.  Review of Systems Pertinent items noted in HPI and remainder of comprehensive ROS otherwise negative.     Objective:    BP (!) 139/98   Pulse 94   Temp 97.9 F (36.6 C) (Oral)   Ht 5\' 1"  (1.549 m)   Wt 184 lb 3.2 oz (83.6 kg)   SpO2 96%   BMI 34.80 kg/m   General:  alert, appears older than stated age, and no distress  Oropharynx: lips, mucosa, and tongue normal; teeth and gums normal   Eyes:  conjunctivae/corneas clear. PERRL, EOM's intact. Fundi benign.   Ears:  normal TM's and external ear canals both ears  Neck: no adenopathy, no carotid bruit, no JVD, supple, symmetrical, trachea midline, and thyroid not enlarged, symmetric, no tenderness/mass/nodules  Thyroid:  no palpable nodule  Lung: clear to auscultation bilaterally  Heart:  regular rate and rhythm, S1, S2 normal, no murmur, click, rub or gallop  Abdomen: soft, non-tender; bowel sounds normal; no masses,  no organomegaly  Extremities: extremities normal, atraumatic, no cyanosis or edema  Pulses: 2+ and symmetric  Skin: warm and dry, no hyperpigmentation, vitiligo, or suspicious lesions  Neuro: normal without focal findings, mental status, speech normal, alert and oriented x3, PERLA, and reflexes normal and symmetric   Lab Review Lab Results  Component Value  Date   TSH 0.006 (L) 04/02/2022   Lab Results  Component Value Date   FREET4 3.18 (H) 04/02/2022      Assessment:  Jean Rowe was seen today for thyroid problem and blood pressure check.  Diagnoses and all orders for this visit:  Essential hypertension BP goal - < 130/80 Explained that having normal blood pressure is the goal and medications are helping to get to goal and maintain normal blood pressure. DIET: Limit salt intake, read nutrition labels to check salt content, limit fried and high fatty foods  Avoid using multisymptom OTC cold preparations that generally contain sudafed which can rise BP. Consult with pharmacist on best cold relief products to use for persons with HTN EXERCISE Discussed incorporating exercise such as walking - 30 minutes most days of the week and can do in 10 minute intervals     Postsurgical hypothyroidism -     TSH + free T4  Medication refill -     TSH + free T4   This note has been created with Myrene Galas. Any transcriptional errors are unintentional.   Education officer, environmental, NP 04/07/2022, 9:53 PM

## 2022-04-09 ENCOUNTER — Other Ambulatory Visit: Payer: Self-pay

## 2022-04-16 ENCOUNTER — Encounter: Payer: BC Managed Care – PPO | Admitting: Cardiovascular Disease

## 2022-04-18 NOTE — Progress Notes (Signed)
Remote pacemaker transmission.   

## 2022-04-23 ENCOUNTER — Other Ambulatory Visit: Payer: Self-pay

## 2022-05-07 ENCOUNTER — Other Ambulatory Visit (INDEPENDENT_AMBULATORY_CARE_PROVIDER_SITE_OTHER): Payer: Self-pay | Admitting: Primary Care

## 2022-05-07 ENCOUNTER — Other Ambulatory Visit: Payer: Self-pay

## 2022-05-07 NOTE — Telephone Encounter (Signed)
Routed to PCP 

## 2022-05-14 ENCOUNTER — Ambulatory Visit (INDEPENDENT_AMBULATORY_CARE_PROVIDER_SITE_OTHER): Payer: BC Managed Care – PPO | Admitting: Primary Care

## 2022-06-05 ENCOUNTER — Ambulatory Visit (INDEPENDENT_AMBULATORY_CARE_PROVIDER_SITE_OTHER): Payer: BC Managed Care – PPO | Admitting: Primary Care

## 2022-06-05 ENCOUNTER — Encounter (INDEPENDENT_AMBULATORY_CARE_PROVIDER_SITE_OTHER): Payer: Self-pay | Admitting: Primary Care

## 2022-06-05 ENCOUNTER — Other Ambulatory Visit: Payer: Self-pay

## 2022-06-05 VITALS — BP 138/98 | HR 79 | Resp 16 | Wt 183.0 lb

## 2022-06-05 DIAGNOSIS — I10 Essential (primary) hypertension: Secondary | ICD-10-CM

## 2022-06-05 DIAGNOSIS — L2389 Allergic contact dermatitis due to other agents: Secondary | ICD-10-CM | POA: Diagnosis not present

## 2022-06-05 DIAGNOSIS — E039 Hypothyroidism, unspecified: Secondary | ICD-10-CM | POA: Diagnosis not present

## 2022-06-05 MED ORDER — AMLODIPINE BESYLATE 10 MG PO TABS
10.0000 mg | ORAL_TABLET | Freq: Every day | ORAL | 1 refills | Status: DC
Start: 2022-06-05 — End: 2022-11-13
  Filled 2022-06-05: qty 90, 90d supply, fill #0
  Filled 2022-09-12: qty 90, 90d supply, fill #1

## 2022-06-05 NOTE — Patient Instructions (Signed)

## 2022-06-05 NOTE — Progress Notes (Signed)
    [facility]  Subjective:    [name] is a 36 y.o. female who presents for evaluation of a rash involving the  bilateral arms, stomach and thighs . Rash started 2 weeks ago. Lesions are red, and raised in texture. Rash has changed over time. Rash causes no discomfort. Associated symptoms: none.  Patient has not had contacts with similar rash. Patient has had new exposures (soaps, lotions, laundry detergents, foods, medications, plants, insects or animals). (Happen the the day after she cut her grass.  The following portions of the patient's history were reviewed and updated as appropriate: allergies, current medications, past family history, past medical history, past social history, and past surgical history.  Review of Systems Pertinent items noted in HPI and remainder of comprehensive ROS otherwise negative.    Objective:    BP (!) 138/98   Pulse 79   Resp 16   Wt 183 lb (83 kg)   SpO2 99%   BMI 35.74 kg/m  Comprehensive ROS Pertinent positive and negative noted in HPI   Physical exam: General: Vital signs reviewed.  Patient is well-developed and well-nourished, xx in no acute distress and cooperative with exam. Head: Normocephalic and atraumatic. Eyes: EOMI, conjunctivae normal, no scleral icterus. Neck: Supple, trachea midline, normal ROM, no JVD, masses, thyromegaly, or carotid bruit present. Cardiovascular: RRR, S1 normal, S2 normal, no murmurs, gallops, or rubs. Pulmonary/Chest: Clear to auscultation bilaterally, no wheezes, rales, or rhonchi. Abdominal: Soft, non-tender, non-distended, BS +, no masses, organomegaly, or guarding present. Musculoskeletal: No joint deformities, erythema, or stiffness, ROM full and nontender. Extremities: No lower extremity edema bilaterally,  pulses symmetric and intact bilaterally. No cyanosis or clubbing. Neurological: A&O x3, Strength is normal Skin: \Resolving on her arms abdomen and thighs rashes with  erythema. Psychiatric: Normal mood and affect. speech and behavior is normal. Cognition and memory are normal.    Assessment:  Jean Rowe was seen today for hypertension, rash and hypothyroidism.  Diagnoses and all orders for this visit:  Allergic contact dermatitis due to other agents Patient is to download pictures when the reaction first occurred today the rash is self resolving with Benadryl  contact dermatitis: plants and grass   Hypothyroidism, unspecified type -     TSH + free T4  Essential hypertension Not consistently taking her amlodipine blood pressure is slightly elevated. Counseled on blood pressure goal of less than 130/80, low-sodium, DASH diet, medication compliance, 150 minutes of moderate intensity exercise per week. Discussed medication compliance, adverse effects.    This note has been created with Education officer, environmental. Any transcriptional errors are unintentional.   Grayce Sessions, NP 06/05/2022, 3:26 PM

## 2022-06-07 ENCOUNTER — Other Ambulatory Visit (INDEPENDENT_AMBULATORY_CARE_PROVIDER_SITE_OTHER): Payer: Self-pay | Admitting: Primary Care

## 2022-06-07 LAB — TSH+FREE T4
Free T4: 0.82 ng/dL (ref 0.82–1.77)
TSH: 15.5 u[IU]/mL — ABNORMAL HIGH (ref 0.450–4.500)

## 2022-06-07 MED ORDER — LEVOTHYROXINE SODIUM 100 MCG PO TABS
100.0000 ug | ORAL_TABLET | Freq: Every day | ORAL | 0 refills | Status: DC
  Filled 2022-06-07: qty 60, 60d supply, fill #0

## 2022-06-08 ENCOUNTER — Other Ambulatory Visit: Payer: Self-pay

## 2022-06-11 ENCOUNTER — Other Ambulatory Visit: Payer: Self-pay

## 2022-06-27 ENCOUNTER — Ambulatory Visit (INDEPENDENT_AMBULATORY_CARE_PROVIDER_SITE_OTHER): Payer: BC Managed Care – PPO

## 2022-06-27 DIAGNOSIS — R55 Syncope and collapse: Secondary | ICD-10-CM | POA: Diagnosis not present

## 2022-06-29 LAB — CUP PACEART REMOTE DEVICE CHECK
Battery Remaining Longevity: 138 mo
Battery Remaining Percentage: 100 %
Brady Statistic RA Percent Paced: 0 %
Brady Statistic RV Percent Paced: 0 %
Date Time Interrogation Session: 20231006061800
Implantable Lead Implant Date: 20060119
Implantable Lead Implant Date: 20060119
Implantable Lead Location: 753859
Implantable Lead Location: 753860
Implantable Lead Model: 4456
Implantable Lead Model: 4469
Implantable Lead Serial Number: 439053
Implantable Lead Serial Number: 457210
Implantable Pulse Generator Implant Date: 20180926
Lead Channel Impedance Value: 402 Ohm
Lead Channel Impedance Value: 407 Ohm
Lead Channel Pacing Threshold Amplitude: 0.4 V
Lead Channel Pacing Threshold Amplitude: 1.1 V
Lead Channel Pacing Threshold Pulse Width: 0.4 ms
Lead Channel Pacing Threshold Pulse Width: 0.4 ms
Lead Channel Setting Pacing Amplitude: 2 V
Lead Channel Setting Pacing Amplitude: 2.5 V
Lead Channel Setting Pacing Pulse Width: 0.4 ms
Lead Channel Setting Sensing Sensitivity: 2.5 mV
Pulse Gen Serial Number: 789100

## 2022-07-06 NOTE — Progress Notes (Signed)
Remote pacemaker transmission.   

## 2022-07-11 ENCOUNTER — Other Ambulatory Visit (INDEPENDENT_AMBULATORY_CARE_PROVIDER_SITE_OTHER): Payer: Self-pay | Admitting: Primary Care

## 2022-07-12 NOTE — Telephone Encounter (Signed)
Requested medication (s) are due for refill today: yes  Requested medication (s) are on the active medication list: yes  Last refill:  cyclobenzaprine: 02/12/22 #90 1 RF                 levothyroxine: 06/07/22 #60  Future visit scheduled: yes  Notes to clinic:  TSH out of range Cyclobenzaprine: med not delegated to NT to reorder   Requested Prescriptions  Pending Prescriptions Disp Refills   cyclobenzaprine (FLEXERIL) 10 MG tablet 90 tablet 1    Sig: Take 1 tablet (10 mg total) by mouth 3 (three) times daily as needed for muscle spasms.     Not Delegated - Analgesics:  Muscle Relaxants Failed - 07/11/2022  9:22 PM      Failed - This refill cannot be delegated      Passed - Valid encounter within last 6 months    Recent Outpatient Visits           1 month ago Essential hypertension   [place], Michelle P, NP   3 months ago Essential hypertension   [place], Michelle P, NP   5 months ago Postsurgical hypothyroidism   [place], Michelle P, NP   1 year ago Postsurgical hypothyroidism   [place], Michelle P, NP   1 year ago Muscle spasm of back   [place] Kerin Perna, NP       Future Appointments             In 5 days Kerin Perna, NP [facility]             levothyroxine (SYNTHROID) 100 MCG tablet 60 tablet 0    Sig: Take 1 tablet (100 mcg total) by mouth daily.     Endocrinology:  Hypothyroid Agents Failed - 07/11/2022  9:22 PM      Failed - TSH in normal range and within 360 days    TSH  Date Value Ref Range Status  06/05/2022 15.500 (H) 0.450 - 4.500 uIU/mL Final         Passed - Valid encounter within last 12 months    Recent Outpatient Visits           1 month ago Essential hypertension   [place] Kerin Perna, NP   3 months  ago Essential hypertension   [place], Michelle P, NP   5 months ago Postsurgical hypothyroidism   [place] Kerin Perna, NP   1 year ago Postsurgical hypothyroidism   [place], Michelle P, NP   1 year ago Muscle spasm of back   [place] Kerin Perna, NP       Future Appointments             In 5 days Kerin Perna, NP [place]

## 2022-07-13 NOTE — Telephone Encounter (Signed)
Will forward to provider  

## 2022-07-15 MED ORDER — CYCLOBENZAPRINE HCL 10 MG PO TABS
10.0000 mg | ORAL_TABLET | Freq: Three times a day (TID) | ORAL | 1 refills | Status: DC | PRN
  Filled 2022-07-15 – 2022-07-17 (×2): qty 90, 30d supply, fill #0
  Filled 2022-09-12: qty 90, 30d supply, fill #1

## 2022-07-15 MED ORDER — LEVOTHYROXINE SODIUM 100 MCG PO TABS
100.0000 ug | ORAL_TABLET | Freq: Every day | ORAL | 0 refills | Status: DC
  Filled 2022-07-15: qty 60, 60d supply, fill #0

## 2022-07-15 NOTE — Telephone Encounter (Signed)
Needs labs for thyroids

## 2022-07-16 ENCOUNTER — Other Ambulatory Visit: Payer: Self-pay

## 2022-07-17 ENCOUNTER — Ambulatory Visit (INDEPENDENT_AMBULATORY_CARE_PROVIDER_SITE_OTHER): Payer: BC Managed Care – PPO | Admitting: Primary Care

## 2022-07-17 ENCOUNTER — Encounter (INDEPENDENT_AMBULATORY_CARE_PROVIDER_SITE_OTHER): Payer: Self-pay | Admitting: Primary Care

## 2022-07-17 ENCOUNTER — Other Ambulatory Visit: Payer: Self-pay

## 2022-07-17 VITALS — BP 135/96 | HR 80 | Resp 16 | Wt 180.0 lb

## 2022-07-17 DIAGNOSIS — E890 Postprocedural hypothyroidism: Secondary | ICD-10-CM

## 2022-07-17 NOTE — Progress Notes (Signed)
[facility]        Subjective:     Jean Rowe is a 36 y.o. female who presents for follow up of hypothyroidism. Current symptoms: fatigue. Patient denies diarrhea, heat / cold intolerance, nervousness, palpitations, and weight changes. Symptoms have gradually improved.  Patient had not been consistently taking thyroid medication on the last visit discussed the function of the thyroid and therapeutic levels would only be obtained with consistency in taking medication every morning on empty stomach.  She returns today and states she has not missed a levothyroxine since our last visit.  We will check thyroid levels.Patient has No headache, No chest pain, No abdominal pain - No Nausea, No new weakness tingling or numbness, No Cough - shortness of breath   The following portions of the patient's history were reviewed and updated as appropriate: allergies, current medications, past family history, past medical history, past social history, and past surgical history.  Review of Systems Pertinent items noted in HPI and remainder of comprehensive ROS otherwise negative.    Objective:  Comprehensive ROS Pertinent positive and negative noted in HPI    BP (!) 135/96   Pulse 80   Resp 16   Wt 180 lb (81.6 kg)   SpO2 96%   BMI 35.15 kg/m   General Appearance:    Alert, cooperative, no distress, appears stated age  Head:    Normocephalic, without obvious abnormality, atraumatic  Eyes:    PERRL, conjunctiva/corneas clear, EOM's intact, fundi    benign, both eyes  Ears:    Normal TM's and external ear canals, both ears  Nose:   Nares normal, septum midline, mucosa normal, no drainage    or sinus tenderness  Throat:   Lips, mucosa, and tongue normal; teeth and gums normal  Neck:   Supple, symmetrical, trachea midline, no adenopathy;    thyroid:  no enlargement/tenderness/nodules; no carotid   bruit or JVD  Back:     Symmetric, no curvature, ROM normal, no CVA  tenderness  Lungs:     Clear to auscultation bilaterally, respirations unlabored  Chest Wall:    No tenderness or deformity   Heart:    Regular rate and rhythm, S1 and S2 normal, no murmur, rub   or gallop  Breast Exam:    defer  Abdomen:     Soft, non-tender, bowel sounds active all four quadrants,    no masses, no organomegaly  Genitalia:  defer  Rectal:  defer  Extremities:   Extremities normal, atraumatic, no cyanosis or edema  Pulses:   2+ and symmetric all extremities  Skin:   Skin color, texture, turgor normal, no rashes or lesions  Lymph nodes:   Cervical, supraclavicular, and axillary nodes normal  Neurologic:   CNII-XII intact, normal strength, sensation and reflexes    throughout    Laboratory: Lab Results  Component Value Date   TSH 15.500 (H) 06/05/2022      Assessment:  Jean Rowe was seen today for hypothyroidism and hypertension.  Diagnoses and all orders for this visit:  Postsurgical hypothyroidism -     TSH + free T4 Replacement levothyroxine 100 mcg daily.     Plan:    1. L-thyroxine per orders. 2. Recheck thyroid function tests in this will redetermine after review of labs  3. Instructed not to take multivitamins or iron within 4 hours of taking thyroid medications.   This note has been created with Surveyor, quantity.  Any transcriptional errors are unintentional.   Grayce Sessions, NP 07/17/2022, 3:32 PM

## 2022-07-18 LAB — TSH+FREE T4
Free T4: 2.07 ng/dL — ABNORMAL HIGH (ref 0.82–1.77)
TSH: 0.394 u[IU]/mL — ABNORMAL LOW (ref 0.450–4.500)

## 2022-07-19 ENCOUNTER — Other Ambulatory Visit: Payer: Self-pay

## 2022-07-25 ENCOUNTER — Other Ambulatory Visit (INDEPENDENT_AMBULATORY_CARE_PROVIDER_SITE_OTHER): Payer: Self-pay | Admitting: Primary Care

## 2022-07-25 ENCOUNTER — Other Ambulatory Visit: Payer: Self-pay

## 2022-07-25 MED ORDER — LEVOTHYROXINE SODIUM 88 MCG PO TABS
88.0000 ug | ORAL_TABLET | Freq: Every day | ORAL | 1 refills | Status: DC
  Filled 2022-07-25 – 2022-09-12 (×2): qty 90, 90d supply, fill #0
  Filled 2022-11-13 – 2022-11-30 (×3): qty 90, 90d supply, fill #1

## 2022-08-01 ENCOUNTER — Other Ambulatory Visit: Payer: Self-pay

## 2022-09-12 ENCOUNTER — Other Ambulatory Visit: Payer: Self-pay

## 2022-09-26 ENCOUNTER — Ambulatory Visit (INDEPENDENT_AMBULATORY_CARE_PROVIDER_SITE_OTHER): Payer: No Typology Code available for payment source

## 2022-09-26 DIAGNOSIS — R55 Syncope and collapse: Secondary | ICD-10-CM | POA: Diagnosis not present

## 2022-09-28 LAB — CUP PACEART REMOTE DEVICE CHECK
Battery Remaining Longevity: 126 mo
Battery Remaining Percentage: 100 %
Brady Statistic RA Percent Paced: 0 %
Brady Statistic RV Percent Paced: 0 %
Date Time Interrogation Session: 20240104200500
Implantable Lead Connection Status: 753985
Implantable Lead Connection Status: 753985
Implantable Lead Implant Date: 20060119
Implantable Lead Implant Date: 20060119
Implantable Lead Location: 753859
Implantable Lead Location: 753860
Implantable Lead Model: 4456
Implantable Lead Model: 4469
Implantable Lead Serial Number: 439053
Implantable Lead Serial Number: 457210
Implantable Pulse Generator Implant Date: 20180926
Lead Channel Impedance Value: 436 Ohm
Lead Channel Impedance Value: 437 Ohm
Lead Channel Pacing Threshold Amplitude: 0.4 V
Lead Channel Pacing Threshold Amplitude: 0.9 V
Lead Channel Pacing Threshold Pulse Width: 0.4 ms
Lead Channel Pacing Threshold Pulse Width: 0.4 ms
Lead Channel Setting Pacing Amplitude: 2 V
Lead Channel Setting Pacing Amplitude: 2.5 V
Lead Channel Setting Pacing Pulse Width: 0.4 ms
Lead Channel Setting Sensing Sensitivity: 2.5 mV
Pulse Gen Serial Number: 789100
Zone Setting Status: 755011

## 2022-10-22 NOTE — Progress Notes (Signed)
Remote pacemaker transmission.   

## 2022-11-13 ENCOUNTER — Other Ambulatory Visit (INDEPENDENT_AMBULATORY_CARE_PROVIDER_SITE_OTHER): Payer: Self-pay | Admitting: Primary Care

## 2022-11-13 DIAGNOSIS — I10 Essential (primary) hypertension: Secondary | ICD-10-CM

## 2022-11-14 ENCOUNTER — Other Ambulatory Visit: Payer: Self-pay

## 2022-11-14 MED ORDER — CYCLOBENZAPRINE HCL 10 MG PO TABS
10.0000 mg | ORAL_TABLET | Freq: Three times a day (TID) | ORAL | 1 refills | Status: DC | PRN
  Filled 2022-11-14: qty 90, 30d supply, fill #0
  Filled 2023-01-28: qty 90, 30d supply, fill #1

## 2022-11-14 MED ORDER — AMLODIPINE BESYLATE 10 MG PO TABS
10.0000 mg | ORAL_TABLET | Freq: Every day | ORAL | 1 refills | Status: DC
  Filled 2022-11-14 – 2023-03-21 (×3): qty 90, 90d supply, fill #0
  Filled 2023-05-16: qty 90, 90d supply, fill #1

## 2022-11-14 NOTE — Telephone Encounter (Signed)
Will forward to provider  

## 2022-11-15 ENCOUNTER — Other Ambulatory Visit: Payer: Self-pay

## 2022-11-16 ENCOUNTER — Other Ambulatory Visit: Payer: Self-pay

## 2022-12-04 ENCOUNTER — Other Ambulatory Visit: Payer: Self-pay

## 2023-02-05 ENCOUNTER — Other Ambulatory Visit: Payer: Self-pay

## 2023-02-06 ENCOUNTER — Other Ambulatory Visit: Payer: Self-pay

## 2023-03-11 ENCOUNTER — Other Ambulatory Visit (INDEPENDENT_AMBULATORY_CARE_PROVIDER_SITE_OTHER): Payer: Self-pay | Admitting: Primary Care

## 2023-03-12 NOTE — Telephone Encounter (Signed)
Requested Prescriptions  Pending Prescriptions Disp Refills   levothyroxine (SYNTHROID) 88 MCG tablet 90 tablet 0    Sig: Take 1 tablet (88 mcg total) by mouth daily.     Endocrinology:  Hypothyroid Agents Failed - 03/11/2023  6:56 PM      Failed - TSH in normal range and within 360 days    TSH  Date Value Ref Range Status  07/17/2022 0.394 (L) 0.450 - 4.500 uIU/mL Final         Passed - Valid encounter within last 12 months    Recent Outpatient Visits           7 months ago Postsurgical hypothyroidism   [place] [facility] Grayce Sessions, NP   9 months ago Essential hypertension   [place] [facility] Grayce Sessions, NP   11 months ago Essential hypertension   [place] [facility] Grayce Sessions, NP   1 year ago Postsurgical hypothyroidism   [place] [facility] Grayce Sessions, NP   1 year ago Postsurgical hypothyroidism   [place] [facility] Grayce Sessions, NP       Future Appointments             In 1 week Randa Evens, Kinnie Scales, NP  [facility]

## 2023-03-13 ENCOUNTER — Other Ambulatory Visit (INDEPENDENT_AMBULATORY_CARE_PROVIDER_SITE_OTHER): Payer: Self-pay | Admitting: Primary Care

## 2023-03-13 ENCOUNTER — Other Ambulatory Visit: Payer: Self-pay

## 2023-03-13 NOTE — Telephone Encounter (Signed)
Pt should have been out of medication back in March will forward to provider

## 2023-03-18 ENCOUNTER — Other Ambulatory Visit: Payer: Self-pay

## 2023-03-20 ENCOUNTER — Other Ambulatory Visit: Payer: Self-pay

## 2023-03-20 ENCOUNTER — Other Ambulatory Visit (INDEPENDENT_AMBULATORY_CARE_PROVIDER_SITE_OTHER): Payer: Self-pay | Admitting: Primary Care

## 2023-03-20 ENCOUNTER — Ambulatory Visit (INDEPENDENT_AMBULATORY_CARE_PROVIDER_SITE_OTHER): Payer: BC Managed Care – PPO | Admitting: Primary Care

## 2023-03-20 VITALS — BP 124/100 | HR 76 | Resp 16 | Ht 60.0 in | Wt 173.6 lb

## 2023-03-20 DIAGNOSIS — E890 Postprocedural hypothyroidism: Secondary | ICD-10-CM

## 2023-03-20 DIAGNOSIS — I10 Essential (primary) hypertension: Secondary | ICD-10-CM

## 2023-03-20 DIAGNOSIS — Z1159 Encounter for screening for other viral diseases: Secondary | ICD-10-CM | POA: Diagnosis not present

## 2023-03-20 MED ORDER — LEVOTHYROXINE SODIUM 88 MCG PO TABS
88.0000 ug | ORAL_TABLET | Freq: Every day | ORAL | 0 refills | Status: DC
  Filled 2023-03-20: qty 30, 30d supply, fill #0

## 2023-03-20 MED ORDER — CYCLOBENZAPRINE HCL 10 MG PO TABS
10.0000 mg | ORAL_TABLET | Freq: Three times a day (TID) | ORAL | 1 refills | Status: DC | PRN
  Filled 2023-03-20: qty 90, 30d supply, fill #0
  Filled 2023-05-16: qty 90, 30d supply, fill #1

## 2023-03-20 NOTE — Progress Notes (Unsigned)
[facility]  Jean Rowe, is a 37 y.o. female  [id number]  [id number]  DOB - 1985/12/13  Chief Complaint  Patient presents with   Hypothyroidism   Hypertension       Subjective:   Jean Rowe is a 37 y.o. female here today for a follow up visit. Patient has No headache, No chest pain, No abdominal pain - No Nausea, No new weakness tingling or numbness, No Cough - shortness of breath Out  of meds No problems updated.  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     Past Medical History:  Diagnosis Date   Acne    Anxiety    panic attacks   COVID-19 virus infection 09/2020   Daily headache    Heart murmur    History of IBS    History of kidney infection    Hypothyroidism    Morbid obesity (HCC)    Neurocardiogenic syncope    Neurocardiogenic syncope    Orthopnoea    Pacemaker 09/2004   for neurocardio syncope   Panic attacks    Symptomatic bradycardia    Thyroid disease     Current Outpatient Medications on File Prior to Visit  Medication Sig Dispense Refill   amLODipine (NORVASC) 10 MG tablet Take 1 tablet (10 mg total) by mouth daily. 90 tablet 1   cyclobenzaprine (FLEXERIL) 10 MG tablet Take 1 tablet (10 mg total) by mouth 3 (three) times daily as needed for  muscle spasms. (Patient not taking: Reported on 03/20/2023) 90 tablet 1   doxycycline (VIBRA-TABS) 100 MG tablet Take 100 mg by mouth 2 (two) times daily. (Patient not taking: Reported on 06/05/2022)     levothyroxine (SYNTHROID) 88 MCG tablet Take 1 tablet (88 mcg total) by mouth daily. 90 tablet 1   tretinoin (RETIN-A) 0.025 % cream Apply 1 Application topically at bedtime.     No current facility-administered medications on file prior to visit.    Objective:   Vitals:   03/20/23 1026 03/20/23 1027  BP: (Abnormal) 133/96 (Abnormal) 124/100  Pulse: 76   Resp: 16   SpO2: 99%   Weight: 173 lb 9.6 oz (78.7 kg)   Height: 5' (1.524 m)     Comprehensive ROS Pertinent positive and negative noted in HPI   Exam General appearance : Awake, alert, not in any distress. Speech Clear. Not toxic looking HEENT: Atraumatic and Normocephalic, pupils equally reactive to light and accomodation Neck: Supple, no JVD. No cervical lymphadenopathy.  Chest: Good air entry bilaterally, no added sounds  CVS: S1 S2 regular, no murmurs.  Abdomen: Bowel sounds present, Non tender and not distended with no gaurding, rigidity or rebound. Extremities: B/L Lower Ext shows no edema, both legs are warm to touch Neurology: Awake alert, and oriented X 3, CN II-XII intact, Non  focal Skin: No Rash  Data Review No results found for: "HGBA1C"  Assessment & Plan  Jean Rowe was seen today for hypothyroidism and hypertension.  Diagnoses and all orders for this visit:  Postsurgical hypothyroidism levothyroxine (SYNTHROID) 88 MCG tablet 30 days until labs reviewed -     TSH + free T4  Need for hepatitis C screening test -     HCV Ab w Reflex to Quant PCR  Essential hypertension BP goal - < 130/80 Explained that having normal blood pressure is the goal and medications are helping to get to goal and maintain normal blood pressure. DIET: Limit salt intake, read nutrition labels to check salt content, limit fried and  high fatty foods  Avoid using multisymptom OTC cold preparations that generally contain sudafed which can rise BP. Consult with pharmacist on best cold relief products to use for persons with HTN EXERCISE Discussed incorporating exercise such as walking - 30 minutes most days of the week and can do in 10 minute intervals    -     CBC with Differential/Platelet -     CMP14+EGFR     Patient have been counseled extensively about nutrition and exercise. Other issues discussed during this visit include: low cholesterol diet, weight control and daily exercise, foot care, annual eye examinations at Ophthalmology, importance of adherence with medications and regular follow-up. We also discussed long term complications of uncontrolled diabetes and hypertension.   Return in about 6 weeks (around 05/01/2023) for pap and 3 or 6 month f/u for thyroid.  The patient was given clear instructions to go to ER or return to medical center if symptoms don't improve, worsen or new problems develop. The patient verbalized understanding. The patient was told to call to get lab results if they haven't heard anything in the next week.   This note has been created with Education officer, environmental. Any transcriptional errors are unintentional.   Grayce Sessions, NP 03/20/2023, 11:13 AM

## 2023-03-21 ENCOUNTER — Other Ambulatory Visit: Payer: Self-pay

## 2023-03-21 LAB — CMP14+EGFR
ALT: 11 IU/L (ref 0–32)
AST: 16 IU/L (ref 0–40)
Albumin: 4.3 g/dL (ref 3.9–4.9)
Alkaline Phosphatase: 112 IU/L (ref 44–121)
BUN/Creatinine Ratio: 8 — ABNORMAL LOW (ref 9–23)
BUN: 7 mg/dL (ref 6–20)
Bilirubin Total: 0.2 mg/dL (ref 0.0–1.2)
CO2: 22 mmol/L (ref 20–29)
Calcium: 9.7 mg/dL (ref 8.7–10.2)
Chloride: 103 mmol/L (ref 96–106)
Creatinine, Ser: 0.91 mg/dL (ref 0.57–1.00)
Globulin, Total: 3 g/dL (ref 1.5–4.5)
Glucose: 80 mg/dL (ref 70–99)
Potassium: 4.4 mmol/L (ref 3.5–5.2)
Sodium: 139 mmol/L (ref 134–144)
Total Protein: 7.3 g/dL (ref 6.0–8.5)
eGFR: 83 mL/min/{1.73_m2} (ref >59)

## 2023-03-21 LAB — CBC WITH DIFFERENTIAL/PLATELET
Basophils Absolute: 0 10*3/uL (ref 0.0–0.2)
Basos: 0 %
EOS (ABSOLUTE): 0.1 10*3/uL (ref 0.0–0.4)
Eos: 1 %
Hematocrit: 41.8 % (ref 34.0–46.6)
Hemoglobin: 13.9 g/dL (ref 11.1–15.9)
Immature Grans (Abs): 0.1 10*3/uL (ref 0.0–0.1)
Immature Granulocytes: 1 %
Lymphocytes Absolute: 2.4 10*3/uL (ref 0.7–3.1)
Lymphs: 32 %
MCH: 31.3 pg (ref 26.6–33.0)
MCHC: 33.3 g/dL (ref 31.5–35.7)
MCV: 94 fL (ref 79–97)
Monocytes Absolute: 0.3 10*3/uL (ref 0.1–0.9)
Monocytes: 4 %
Neutrophils Absolute: 4.6 10*3/uL (ref 1.4–7.0)
Neutrophils: 62 %
Platelets: 368 10*3/uL (ref 150–450)
RBC: 4.44 x10E6/uL (ref 3.77–5.28)
RDW: 12.4 % (ref 11.7–15.4)
WBC: 7.6 10*3/uL (ref 3.4–10.8)

## 2023-03-21 LAB — TSH+FREE T4
Free T4: 0.74 ng/dL — ABNORMAL LOW (ref 0.82–1.77)
TSH: 13.7 u[IU]/mL — ABNORMAL HIGH (ref 0.450–4.500)

## 2023-03-21 LAB — HCV AB W REFLEX TO QUANT PCR
HCV Ab: NONREACTIVE
HCV Ab: NONREACTIVE

## 2023-03-21 LAB — HCV INTERPRETATION

## 2023-03-29 ENCOUNTER — Other Ambulatory Visit (INDEPENDENT_AMBULATORY_CARE_PROVIDER_SITE_OTHER): Payer: Self-pay | Admitting: Primary Care

## 2023-03-29 ENCOUNTER — Other Ambulatory Visit: Payer: Self-pay

## 2023-03-29 MED ORDER — LEVOTHYROXINE SODIUM 112 MCG PO TABS
112.0000 ug | ORAL_TABLET | Freq: Every day | ORAL | 1 refills | Status: DC
  Filled 2023-03-29 – 2023-04-10 (×2): qty 30, 30d supply, fill #0
  Filled 2023-05-16: qty 30, 30d supply, fill #1

## 2023-04-05 ENCOUNTER — Other Ambulatory Visit: Payer: Self-pay

## 2023-04-10 ENCOUNTER — Other Ambulatory Visit: Payer: Self-pay

## 2023-05-06 ENCOUNTER — Ambulatory Visit (INDEPENDENT_AMBULATORY_CARE_PROVIDER_SITE_OTHER): Payer: BC Managed Care – PPO | Admitting: Primary Care

## 2023-05-06 ENCOUNTER — Ambulatory Visit (INDEPENDENT_AMBULATORY_CARE_PROVIDER_SITE_OTHER): Payer: BC Managed Care – PPO

## 2023-05-16 ENCOUNTER — Other Ambulatory Visit: Payer: Self-pay

## 2023-05-17 ENCOUNTER — Other Ambulatory Visit: Payer: Self-pay

## 2023-06-20 ENCOUNTER — Other Ambulatory Visit: Payer: Self-pay

## 2023-06-20 ENCOUNTER — Encounter (INDEPENDENT_AMBULATORY_CARE_PROVIDER_SITE_OTHER): Payer: Self-pay | Admitting: Primary Care

## 2023-06-20 ENCOUNTER — Ambulatory Visit (INDEPENDENT_AMBULATORY_CARE_PROVIDER_SITE_OTHER): Payer: BC Managed Care – PPO | Admitting: Primary Care

## 2023-06-20 VITALS — BP 136/88 | HR 73 | Resp 16 | Wt 178.4 lb

## 2023-06-20 DIAGNOSIS — I10 Essential (primary) hypertension: Secondary | ICD-10-CM | POA: Diagnosis not present

## 2023-06-20 DIAGNOSIS — E890 Postprocedural hypothyroidism: Secondary | ICD-10-CM

## 2023-06-20 MED ORDER — AMLODIPINE BESYLATE 10 MG PO TABS
10.0000 mg | ORAL_TABLET | Freq: Every day | ORAL | 1 refills | Status: DC
Start: 2023-06-20 — End: 2023-12-17
  Filled 2023-06-20: qty 90, 90d supply, fill #0
  Filled 2023-07-16 – 2023-12-05 (×2): qty 90, 90d supply, fill #1

## 2023-06-20 MED ORDER — LEVOTHYROXINE SODIUM 112 MCG PO TABS
112.0000 ug | ORAL_TABLET | Freq: Every day | ORAL | 1 refills | Status: AC
Start: 2023-06-20 — End: ?
  Filled 2023-06-20: qty 30, 30d supply, fill #0

## 2023-06-21 ENCOUNTER — Other Ambulatory Visit (INDEPENDENT_AMBULATORY_CARE_PROVIDER_SITE_OTHER): Payer: Self-pay | Admitting: Primary Care

## 2023-06-21 ENCOUNTER — Other Ambulatory Visit: Payer: Self-pay

## 2023-06-21 DIAGNOSIS — E890 Postprocedural hypothyroidism: Secondary | ICD-10-CM

## 2023-06-21 LAB — TSH+FREE T4
Free T4: 1.02 ng/dL (ref 0.82–1.77)
TSH: 0.776 u[IU]/mL (ref 0.450–4.500)

## 2023-06-21 MED ORDER — LEVOTHYROXINE SODIUM 112 MCG PO TABS
112.0000 ug | ORAL_TABLET | Freq: Every day | ORAL | 1 refills | Status: DC
Start: 2023-06-21 — End: 2023-12-05
  Filled 2023-06-21: qty 60, 60d supply, fill #0
  Filled 2023-07-16: qty 90, 90d supply, fill #0
  Filled 2023-10-09: qty 30, 30d supply, fill #1

## 2023-06-21 MED ORDER — LEVOTHYROXINE SODIUM 112 MCG PO TABS
112.0000 ug | ORAL_TABLET | Freq: Every day | ORAL | 1 refills | Status: DC
Start: 2023-06-21 — End: 2023-06-21

## 2023-06-26 ENCOUNTER — Ambulatory Visit (INDEPENDENT_AMBULATORY_CARE_PROVIDER_SITE_OTHER): Payer: BC Managed Care – PPO

## 2023-06-26 DIAGNOSIS — R55 Syncope and collapse: Secondary | ICD-10-CM | POA: Diagnosis not present

## 2023-07-02 LAB — CUP PACEART REMOTE DEVICE CHECK
Battery Remaining Longevity: 120 mo
Battery Remaining Percentage: 100 %
Brady Statistic RA Percent Paced: 0 %
Brady Statistic RV Percent Paced: 0 %
Date Time Interrogation Session: 20241008061800
Implantable Lead Connection Status: 753985
Implantable Lead Connection Status: 753985
Implantable Lead Implant Date: 20060119
Implantable Lead Implant Date: 20060119
Implantable Lead Location: 753859
Implantable Lead Location: 753860
Implantable Lead Model: 4456
Implantable Lead Model: 4469
Implantable Lead Serial Number: 439053
Implantable Lead Serial Number: 457210
Implantable Pulse Generator Implant Date: 20180926
Lead Channel Impedance Value: 418 Ohm
Lead Channel Impedance Value: 424 Ohm
Lead Channel Pacing Threshold Amplitude: 0.4 V
Lead Channel Pacing Threshold Amplitude: 1 V
Lead Channel Pacing Threshold Pulse Width: 0.4 ms
Lead Channel Pacing Threshold Pulse Width: 0.4 ms
Lead Channel Setting Pacing Amplitude: 2 V
Lead Channel Setting Pacing Amplitude: 2.5 V
Lead Channel Setting Pacing Pulse Width: 0.4 ms
Lead Channel Setting Sensing Sensitivity: 2.5 mV
Pulse Gen Serial Number: 789100
Zone Setting Status: 755011

## 2023-07-03 ENCOUNTER — Encounter (HOSPITAL_COMMUNITY): Payer: Self-pay

## 2023-07-03 ENCOUNTER — Ambulatory Visit (HOSPITAL_COMMUNITY)
Admission: RE | Admit: 2023-07-03 | Discharge: 2023-07-03 | Disposition: A | Discharge status: Home / Self Care | Payer: BC Managed Care – PPO | Source: Ambulatory Visit | Attending: Family Medicine | Admitting: Family Medicine

## 2023-07-03 VITALS — BP 135/92 | HR 106 | Temp 99.0°F | Resp 16

## 2023-07-03 DIAGNOSIS — J069 Acute upper respiratory infection, unspecified: Secondary | ICD-10-CM | POA: Diagnosis not present

## 2023-07-03 DIAGNOSIS — L089 Local infection of the skin and subcutaneous tissue, unspecified: Secondary | ICD-10-CM

## 2023-07-03 DIAGNOSIS — B9689 Other specified bacterial agents as the cause of diseases classified elsewhere: Secondary | ICD-10-CM | POA: Diagnosis not present

## 2023-07-03 MED ORDER — DOXYCYCLINE HYCLATE 100 MG PO CAPS: 100.0000 mg | ORAL_CAPSULE | Freq: Two times a day (BID) | ORAL | 0 refills | Status: AC

## 2023-07-03 MED ORDER — PROMETHAZINE-DM 6.25-15 MG/5ML PO SYRP: 5.0000 mL | ORAL_SOLUTION | Freq: Four times a day (QID) | ORAL | 0 refills | Status: DC | PRN

## 2023-07-03 NOTE — ED Provider Notes (Signed)
[facility]   [id number] 07/03/23 Arrival Time: 1249  ASSESSMENT & PLAN:  1. Viral URI with cough   2. Localized bacterial skin infection    Discussed typical duration of likely viral illness. Cough med as needed. Two presumed insect bites on neck and upper back. Watch closely. Start antibiotic. No indication for I&D. OTC symptom care/analgesics as needed.  New Prescriptions   DOXYCYCLINE (VIBRAMYCIN) 100 MG CAPSULE    Take 1 capsule (100 mg total) by mouth 2 (two) times daily.   PROMETHAZINE-DEXTROMETHORPHAN (PROMETHAZINE-DM) 6.25-15 MG/5ML SYRUP    Take 5 mLs by mouth 4 (four) times daily as needed for cough.     Follow-up Information     Grayce Sessions, NP.   Specialty: Internal Medicine Why: As needed. Contact information: [address] [place] [address] [phone]         [facility]   Specialty: Urgent Care Why: If worsening or failing to improve as anticipated. Contact information: [address] [place] [address] [phone]                Reviewed expectations re: course of current medical issues. Questions answered. Outlined signs and symptoms indicating need for more acute intervention. Understanding verbalized. After Visit Summary given.   SUBJECTIVE: History from: Patient. JENNESSA TRIGO is a 37 y.o. female. Pt c/o sore throat, chills, coughing that started yesterday.  Pt reports "insect bites" on left neck and upper back yesterday. Pt c/o neck sore and that it hurts to turn head towards the left side.  Denies: fever. Normal PO intake without n/v/d.  OBJECTIVE:  Vitals:   07/03/23 1316  BP: (!) 135/92  Pulse: (!) 106  Resp: 16  Temp: 99 F (37.2 C)  TempSrc: Oral  SpO2: 98%    General appearance: alert; no distress Eyes: PERRLA; EOMI; conjunctiva normal HENT: [place]; AT; with nasal congestion; throat with mild erythema and cobblestoning Neck: supple   Lungs: speaks full sentences without difficulty; unlabored; dry cough Extremities: no edema Skin: warm and dry; L neck and midline upper back with two slightly indurations of skin; mild overlying erythema; areas are TTP; both measure approx 1 cm in diam Neurologic: normal gait Psychological: alert and cooperative; normal mood and affect   Allergies  Allergen Reactions   Banana Anaphylaxis    Swelling of throat   Other Other (See Comments)    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     Past Medical History:  Diagnosis Date   Acne    Anxiety    panic attacks   COVID-19 virus infection 09/2020   Daily headache    Heart murmur    History of IBS    History of kidney infection    Hypothyroidism    Morbid obesity (HCC)    Neurocardiogenic syncope    Neurocardiogenic syncope    Orthopnoea    Pacemaker 09/2004  for neurocardio syncope   Panic attacks    Symptomatic bradycardia    Thyroid disease    Social History   Socioeconomic History   Marital status: Single    Spouse name: Not on file   Number of children: Not on file   Years of education: Not on file   Highest education level: 12th grade  Occupational History   Not on file  Tobacco Use   Smoking status: Former    Types: Cigars   Smokeless tobacco: Never   Tobacco comments:    11/04/2013 "smoke 3 black N milds/day"    2019 patient stop smoking   Substance and Sexual Activity   Alcohol use: Yes    Comment: 11/04/2013 "mixed drinks or wine once a month   Drug use: No    Types: Marijuana    Comment: none for 6 mos. since 03/2014   Sexual activity: Yes    Birth control/protection: I.U.D.  Other Topics Concern   Not on file  Social History Narrative   Epworth Sleepiness Scale = 12 (as of 10/22/2016)   Social Determinants of Health   Financial Resource Strain: Medium Risk (03/20/2023)   Overall Financial Resource Strain (CARDIA)    Difficulty of Paying Living Expenses: Somewhat hard  Food Insecurity: Food Insecurity Present (03/20/2023)   Hunger Vital Sign    Worried About Running Out of Food in the Last Year: Sometimes true    Ran Out of Food in the Last Year: Sometimes true  Transportation Needs: No Transportation Needs (03/20/2023)   PRAPARE - Administrator, Civil Service (Medical): No    Lack of Transportation (Non-Medical): No  Physical Activity: Insufficiently Active (03/20/2023)   Exercise Vital Sign    Days of Exercise per Week: 2 days    Minutes of Exercise per Session: 50 min  Stress: Stress Concern Present (03/20/2023)   [employer] of Occupational Health - Occupational Stress Questionnaire    Feeling of Stress : Rather much  Social Connections: Moderately Isolated (03/20/2023)   Social Connection and Isolation Panel [NHANES]    Frequency of Communication with Friends and Family: Three times a week    Frequency of Social Gatherings with Friends and Family: Twice a week    Attends Religious Services: 1 to 4 times per year    Active Member of [employer] or Organizations: No    Attends Engineer, structural: Not on file    Marital Status: Never married  Catering manager Violence: Not on file   Family History  Problem Relation Age of Onset   Thyroid disease Mother    Heart disease Mother    Thyroid disease Maternal Uncle    Thyroid disease Maternal Grandmother    Heart failure Maternal  Grandmother    Hypertension Maternal Grandmother    Hypertension Maternal Grandfather    Past Surgical History:  Procedure Laterality Date   DILATION AND CURETTAGE OF UTERUS  2013   INSERT / REPLACE / REMOVE PACEMAKER  09/2004   PPM GENERATOR CHANGEOUT N/A 06/19/2017   Procedure: PPM GENERATOR CHANGEOUT;  Surgeon: Thurmon Fair, MD;  Location: [facility] INVASIVE [facility];  Service: Cardiovascular;  Laterality: N/A;   THYROIDECTOMY  11/04/2013   THYROIDECTOMY N/A 11/04/2013   Procedure: THYROIDECTOMY;  Surgeon: Ernestene Mention, MD;  Location: [facility] OR;  Service: General;  Laterality: Vertis Kelch, MD 07/03/23 1337

## 2023-07-03 NOTE — ED Triage Notes (Signed)
Pt c/o sore throat, chills that started yesterday.  Pt reports insect bites on left neck and upper back yesterday. Pt c/o neck sore and unable to turn head towards the left side.

## 2023-07-12 NOTE — Progress Notes (Signed)
Remote pacemaker transmission.   

## 2023-07-16 ENCOUNTER — Other Ambulatory Visit (INDEPENDENT_AMBULATORY_CARE_PROVIDER_SITE_OTHER): Payer: Self-pay | Admitting: Primary Care

## 2023-07-16 ENCOUNTER — Other Ambulatory Visit: Payer: Self-pay

## 2023-07-16 NOTE — Telephone Encounter (Signed)
Will forward to provider  

## 2023-07-17 ENCOUNTER — Other Ambulatory Visit: Payer: Self-pay

## 2023-07-17 MED ORDER — CYCLOBENZAPRINE HCL 10 MG PO TABS
10.0000 mg | ORAL_TABLET | Freq: Three times a day (TID) | ORAL | 1 refills | Status: DC | PRN
  Filled 2023-07-17 (×2): qty 90, 30d supply, fill #0
  Filled 2023-10-09: qty 90, 30d supply, fill #1

## 2023-08-20 ENCOUNTER — Telehealth (INDEPENDENT_AMBULATORY_CARE_PROVIDER_SITE_OTHER): Payer: Self-pay | Admitting: Primary Care

## 2023-08-20 NOTE — Telephone Encounter (Signed)
Called to confirm pt apt. VM left

## 2023-08-21 ENCOUNTER — Ambulatory Visit (INDEPENDENT_AMBULATORY_CARE_PROVIDER_SITE_OTHER): Payer: BC Managed Care – PPO | Admitting: Primary Care

## 2023-10-10 ENCOUNTER — Other Ambulatory Visit: Payer: Self-pay

## 2023-10-16 ENCOUNTER — Other Ambulatory Visit: Payer: Self-pay

## 2023-12-05 ENCOUNTER — Other Ambulatory Visit (INDEPENDENT_AMBULATORY_CARE_PROVIDER_SITE_OTHER): Payer: Self-pay | Admitting: Primary Care

## 2023-12-05 DIAGNOSIS — E890 Postprocedural hypothyroidism: Secondary | ICD-10-CM

## 2023-12-06 ENCOUNTER — Other Ambulatory Visit: Payer: Self-pay

## 2023-12-06 MED ORDER — LEVOTHYROXINE SODIUM 112 MCG PO TABS
112.0000 ug | ORAL_TABLET | Freq: Every day | ORAL | 1 refills | Status: DC
  Filled 2023-12-06: qty 60, 60d supply, fill #0
  Filled 2024-01-12 – 2024-01-13 (×2): qty 60, 60d supply, fill #1
  Filled 2024-01-15: qty 30, 30d supply, fill #1
  Filled 2024-01-20: qty 60, 60d supply, fill #1

## 2023-12-06 NOTE — Telephone Encounter (Signed)
 Requested medication (s) are due for refill today: Yes  Requested medication (s) are on the active medication list: Yes  Last refill:  07/17/23  Future visit scheduled: No  Notes to clinic:  Not delegated.    Requested Prescriptions  Pending Prescriptions Disp Refills   cyclobenzaprine (FLEXERIL) 10 MG tablet 90 tablet 1    Sig: Take 1 tablet (10 mg total) by mouth 3 (three) times daily as needed for muscle spasms.     Not Delegated - Analgesics:  Muscle Relaxants Failed - 12/06/2023 12:55 PM      Failed - This refill cannot be delegated      Passed - Valid encounter within last 6 months    Recent Outpatient Visits           5 months ago Postsurgical hypothyroidism   [place] [facility] Grayce Sessions, NP   8 months ago Postsurgical hypothyroidism   [place] [facility] Grayce Sessions, NP   1 year ago Postsurgical hypothyroidism   [place] [facility] Grayce Sessions, NP   1 year ago Essential hypertension   [place] [facility] Grayce Sessions, NP   1 year ago Essential hypertension   [place] [facility] Grayce Sessions, NP              Signed Prescriptions Disp Refills   levothyroxine (SYNTHROID) 112 MCG tablet 60 tablet 1    Sig: Take 1 tablet (112 mcg total) by mouth daily.     Endocrinology:  Hypothyroid Agents Passed - 12/06/2023 12:55 PM      Passed - TSH in normal range and within 360 days    TSH  Date Value Ref Range Status  06/20/2023 0.776 0.450 - 4.500 uIU/mL Final         Passed - Valid encounter within last 12 months    Recent Outpatient Visits           5 months ago Postsurgical hypothyroidism   [place] [facility] Grayce Sessions, NP   8 months ago Postsurgical hypothyroidism   [place] [facility] Grayce Sessions, NP   1 year ago Postsurgical hypothyroidism   [place]  [facility] Grayce Sessions, NP   1 year ago Essential hypertension   [place] [facility] Grayce Sessions, NP   1 year ago Essential hypertension    [facility] Grayce Sessions, NP

## 2023-12-06 NOTE — Telephone Encounter (Signed)
 Requested Prescriptions  Pending Prescriptions Disp Refills   levothyroxine (SYNTHROID) 112 MCG tablet 60 tablet 1    Sig: Take 1 tablet (112 mcg total) by mouth daily.     Endocrinology:  Hypothyroid Agents Passed - 12/06/2023 12:55 PM      Passed - TSH in normal range and within 360 days    TSH  Date Value Ref Range Status  06/20/2023 0.776 0.450 - 4.500 uIU/mL Final         Passed - Valid encounter within last 12 months    Recent Outpatient Visits           5 months ago Postsurgical hypothyroidism   [place] [facility] Grayce Sessions, NP   8 months ago Postsurgical hypothyroidism   [place] [facility] Grayce Sessions, NP   1 year ago Postsurgical hypothyroidism   [place] [facility] Grayce Sessions, NP   1 year ago Essential hypertension   [place] [facility] Grayce Sessions, NP   1 year ago Essential hypertension   [place] [facility] Grayce Sessions, NP               cyclobenzaprine (FLEXERIL) 10 MG tablet 90 tablet 1    Sig: Take 1 tablet (10 mg total) by mouth 3 (three) times daily as needed for muscle spasms.     Not Delegated - Analgesics:  Muscle Relaxants Failed - 12/06/2023 12:55 PM      Failed - This refill cannot be delegated      Passed - Valid encounter within last 6 months    Recent Outpatient Visits           5 months ago Postsurgical hypothyroidism   [place] [facility] Grayce Sessions, NP   8 months ago Postsurgical hypothyroidism   [place] [facility] Grayce Sessions, NP   1 year ago Postsurgical hypothyroidism   [place] [facility] Grayce Sessions, NP   1 year ago Essential hypertension   [place] [facility] Grayce Sessions, NP   1 year ago Essential hypertension   [place] [facility] Grayce Sessions, NP

## 2023-12-06 NOTE — Telephone Encounter (Signed)
 Will forward to provider

## 2023-12-09 ENCOUNTER — Other Ambulatory Visit: Payer: Self-pay

## 2023-12-17 ENCOUNTER — Encounter: Payer: Self-pay | Admitting: Cardiovascular Disease

## 2023-12-17 ENCOUNTER — Ambulatory Visit (INDEPENDENT_AMBULATORY_CARE_PROVIDER_SITE_OTHER): Admitting: Primary Care

## 2023-12-17 ENCOUNTER — Other Ambulatory Visit (HOSPITAL_COMMUNITY)
Admission: RE | Admit: 2023-12-17 | Discharge: 2023-12-17 | Disposition: A | Discharge status: Home / Self Care | Source: Ambulatory Visit | Attending: Primary Care | Admitting: Primary Care

## 2023-12-17 ENCOUNTER — Other Ambulatory Visit: Payer: Self-pay

## 2023-12-17 ENCOUNTER — Encounter (INDEPENDENT_AMBULATORY_CARE_PROVIDER_SITE_OTHER): Payer: Self-pay | Admitting: Primary Care

## 2023-12-17 VITALS — BP 126/82 | Ht 60.0 in

## 2023-12-17 DIAGNOSIS — Z113 Encounter for screening for infections with a predominantly sexual mode of transmission: Secondary | ICD-10-CM | POA: Diagnosis not present

## 2023-12-17 DIAGNOSIS — Z124 Encounter for screening for malignant neoplasm of cervix: Secondary | ICD-10-CM | POA: Insufficient documentation

## 2023-12-17 DIAGNOSIS — B9689 Other specified bacterial agents as the cause of diseases classified elsewhere: Secondary | ICD-10-CM | POA: Insufficient documentation

## 2023-12-17 DIAGNOSIS — N760 Acute vaginitis: Secondary | ICD-10-CM | POA: Insufficient documentation

## 2023-12-17 DIAGNOSIS — E890 Postprocedural hypothyroidism: Secondary | ICD-10-CM | POA: Diagnosis not present

## 2023-12-17 DIAGNOSIS — B3731 Acute candidiasis of vulva and vagina: Secondary | ICD-10-CM | POA: Diagnosis not present

## 2023-12-17 DIAGNOSIS — I10 Essential (primary) hypertension: Secondary | ICD-10-CM | POA: Diagnosis not present

## 2023-12-17 DIAGNOSIS — M62830 Muscle spasm of back: Secondary | ICD-10-CM

## 2023-12-17 MED ORDER — AMLODIPINE BESYLATE 10 MG PO TABS
10.0000 mg | ORAL_TABLET | Freq: Every day | ORAL | 1 refills | Status: AC
  Filled 2023-12-17 – 2024-07-27 (×2): qty 90, 90d supply, fill #0

## 2023-12-17 MED ORDER — CYCLOBENZAPRINE HCL 10 MG PO TABS
10.0000 mg | ORAL_TABLET | Freq: Three times a day (TID) | ORAL | 1 refills | Status: DC | PRN
  Filled 2023-12-17: qty 90, 30d supply, fill #0
  Filled 2024-03-26: qty 90, 30d supply, fill #1

## 2023-12-17 NOTE — Progress Notes (Signed)
 [facility]  WELL-WOMAN PHYSICAL & PAP Patient name: Jean Rowe MRN [id number]  Date of birth: 1986-09-05 Chief Complaint:   Gynecologic Exam  History of Present Illness:   Jean Rowe is a 38 y.o. G2P2 female being seen today for a routine well-woman exam.   CC: gyn  The current method of family planning is IUD.  No LMP recorded. (Menstrual status: IUD). Last pap 10/13/19. Results were: normal Family h/o breast cancer: No Family h/o colorectal cancer: No  Health Maintenance  Topic Date Due   Pap with HPV screening  10/12/2022   COVID-19 Vaccine (1 - 2024-25 season) Never done   Flu Shot  12/23/2023*   DTaP/Tdap/Td vaccine (2 - Td or Tdap) 12/01/2026   Hepatitis C Screening  Completed   HIV Screening  Completed   Pneumococcal Vaccination  Aged Out   HPV Vaccine  Aged Out  *Topic was postponed. The date shown is not the original due date.   Review of Systems:    Denies any headaches, blurred vision, fatigue, shortness of breath, chest pain, abdominal pain, abnormal vaginal discharge/itching/odor/irritation, problems with periods, bowel movements, urination, or intercourse unless otherwise stated above.  Pertinent History Reviewed:   Reviewed past medical,surgical, social and family history.  Reviewed problem list, medications and allergies.  Physical Assessment:   Vitals:   12/17/23 0945 12/17/23 0955  BP: (!) 153/109 126/82  Height: 5' (1.524 m)   Body mass index is 34.84 kg/m.        Physical Examination:  General appearance - well appearing, and in no distress Mental status - alert, oriented to person, place, and time Psych:  She has a normal mood and affect Skin - warm and dry, normal color, no suspicious lesions noted Chest - effort normal, all lung fields clear to auscultation bilaterally Heart - normal rate and regular rhythm Neck:  midline trachea, no thyromegaly or nodules Breasts - breasts appear normal, no suspicious  masses, no skin or nipple changes or axillary nodes Educated patient on proper self breast examination and had patient to demonstrate SBE. Abdomen - soft, nontender, nondistended, no masses or organomegaly Pelvic-VULVA: normal appearing vulva with no masses, tenderness or lesions   VAGINA: normal appearing vagina with normal color and discharge, no lesions   CERVIX: normal appearing cervix without discharge or lesions, no CMT UTERUS: uterus is felt to be normal size, shape, consistency and nontender  ADNEXA: No adnexal masses or tenderness noted. Extremities:  No swelling or varicosities noted  No results found for this or any previous visit (from the past 24 hours).   Assessment & Plan:  Jean Rowe was seen today for gynecologic exam.  Diagnoses and all orders for this visit:  Cervical cancer screening -     Cervicovaginal ancillary only -     Cytology - PAP -     HIV antibody (with reflex)  Postsurgical hypothyroidism -     TSH + free T4  Essential hypertension Bp well controlled. BP goal - < 130/80 DIET: Limit salt intake, read nutrition labels to check salt content, limit fried and high fatty foods  Avoid using multisymptom OTC cold preparations that generally contain sudafed which can rise BP. Consult with pharmacist on best cold relief products to use for persons with HTN EXERCISE Discussed incorporating exercise such as walking - 30 minutes most days of the week and can do in 10 minute intervals    -     amLODipine (NORVASC) 10 MG tablet; Take 1  tablet (10 mg total) by mouth daily.  Muscle spasm of back -     cyclobenzaprine (FLEXERIL) 10 MG tablet; Take 1 tablet (10 mg total) by mouth 3 (three) times daily as needed for muscle spasms.    Orders Placed This Encounter  Procedures   TSH + free T4   HIV antibody (with reflex)   Meds:  Meds ordered this encounter  Medications   amLODipine (NORVASC) 10 MG tablet    Sig: Take 1 tablet (10 mg total) by mouth daily.     Dispense:  90 tablet    Refill:  1    Supervising Provider:   Quentin Angst [[id number]]   cyclobenzaprine (FLEXERIL) 10 MG tablet    Sig: Take 1 tablet (10 mg total) by mouth 3 (three) times daily as needed for muscle spasms.    Dispense:  90 tablet    Refill:  1    Supervising Provider:   Quentin Angst [[id number]]    Follow-up: Return in about 3 months (around 03/18/2024) for labs /HTN.  This note has been created with Education officer, environmental. Any transcriptional errors are unintentional.   Grayce Sessions, NP 12/17/2023, 10:24 AM

## 2023-12-18 ENCOUNTER — Other Ambulatory Visit: Payer: Self-pay

## 2023-12-18 LAB — HIV ANTIBODY (ROUTINE TESTING W REFLEX): HIV Screen 4th Generation wRfx: NONREACTIVE

## 2023-12-18 LAB — TSH+FREE T4
Free T4: 0.46 ng/dL — ABNORMAL LOW (ref 0.82–1.77)
TSH: 30.7 u[IU]/mL — ABNORMAL HIGH (ref 0.450–4.500)

## 2023-12-19 ENCOUNTER — Encounter (INDEPENDENT_AMBULATORY_CARE_PROVIDER_SITE_OTHER): Payer: Self-pay | Admitting: Primary Care

## 2023-12-20 ENCOUNTER — Other Ambulatory Visit: Payer: Self-pay

## 2023-12-20 ENCOUNTER — Other Ambulatory Visit (INDEPENDENT_AMBULATORY_CARE_PROVIDER_SITE_OTHER): Payer: Self-pay | Admitting: Primary Care

## 2023-12-20 LAB — CERVICOVAGINAL ANCILLARY ONLY
Bacterial Vaginitis (gardnerella): POSITIVE — AB
Candida Glabrata: NEGATIVE
Candida Vaginitis: POSITIVE — AB
Chlamydia: NEGATIVE
Comment: NEGATIVE
Comment: NEGATIVE
Comment: NEGATIVE
Comment: NEGATIVE
Comment: NEGATIVE
Comment: NORMAL
Neisseria Gonorrhea: NEGATIVE
Trichomonas: NEGATIVE

## 2023-12-20 MED ORDER — CLINDAMYCIN PHOSPHATE 2 % VA GEL
1.0000 | Freq: Every day | VAGINAL | 0 refills | Status: AC
  Filled 2023-12-20: qty 8, 14d supply, fill #0
  Filled 2023-12-20: qty 8, 7d supply, fill #0
  Filled 2023-12-30: qty 8, 30d supply, fill #0
  Filled 2024-01-13: qty 8, 5d supply, fill #0

## 2023-12-20 MED ORDER — FLUCONAZOLE 150 MG PO TABS
150.0000 mg | ORAL_TABLET | Freq: Every day | ORAL | 1 refills | Status: AC
  Filled 2023-12-20 (×2): qty 1, 1d supply, fill #0
  Filled 2024-01-13: qty 1, 1d supply, fill #1

## 2023-12-23 ENCOUNTER — Other Ambulatory Visit: Payer: Self-pay

## 2023-12-23 LAB — CYTOLOGY - PAP
Comment: NEGATIVE
Diagnosis: NEGATIVE
High risk HPV: NEGATIVE

## 2023-12-24 ENCOUNTER — Other Ambulatory Visit: Payer: Self-pay

## 2023-12-24 MED ORDER — CLINDAMYCIN HCL 300 MG PO CAPS
300.0000 mg | ORAL_CAPSULE | Freq: Two times a day (BID) | ORAL | 0 refills | Status: AC
  Filled 2023-12-24: qty 14, 7d supply, fill #0

## 2023-12-30 ENCOUNTER — Other Ambulatory Visit: Payer: Self-pay

## 2024-01-13 ENCOUNTER — Other Ambulatory Visit: Payer: Self-pay

## 2024-01-14 ENCOUNTER — Other Ambulatory Visit: Payer: Self-pay

## 2024-01-15 ENCOUNTER — Other Ambulatory Visit: Payer: Self-pay

## 2024-01-20 ENCOUNTER — Other Ambulatory Visit: Payer: Self-pay

## 2024-03-18 ENCOUNTER — Telehealth (INDEPENDENT_AMBULATORY_CARE_PROVIDER_SITE_OTHER): Payer: Self-pay

## 2024-03-18 NOTE — Telephone Encounter (Signed)
 Reached out to pt in regards to 03/19/24 appointment. Appointment will need to be reschedule due to provider will be out of the office. Pt didn't answer lvm and sent MyChart message

## 2024-03-19 ENCOUNTER — Ambulatory Visit (INDEPENDENT_AMBULATORY_CARE_PROVIDER_SITE_OTHER): Admitting: Primary Care

## 2024-03-26 ENCOUNTER — Telehealth: Payer: Self-pay | Admitting: Primary Care

## 2024-03-26 ENCOUNTER — Other Ambulatory Visit (INDEPENDENT_AMBULATORY_CARE_PROVIDER_SITE_OTHER): Payer: Self-pay | Admitting: Primary Care

## 2024-03-26 ENCOUNTER — Other Ambulatory Visit: Payer: Self-pay

## 2024-03-26 ENCOUNTER — Other Ambulatory Visit (INDEPENDENT_AMBULATORY_CARE_PROVIDER_SITE_OTHER): Payer: Self-pay

## 2024-03-26 DIAGNOSIS — E890 Postprocedural hypothyroidism: Secondary | ICD-10-CM

## 2024-03-26 MED ORDER — LEVOTHYROXINE SODIUM 112 MCG PO TABS
112.0000 ug | ORAL_TABLET | Freq: Every day | ORAL | 0 refills | Status: DC
  Filled 2024-03-26: qty 30, 30d supply, fill #0

## 2024-03-26 NOTE — Telephone Encounter (Signed)
 Contacted pt 1st attempt left vm phone disconnected, called 2nd attempt went to vm left message to resch appt.

## 2024-03-30 ENCOUNTER — Other Ambulatory Visit: Payer: Self-pay

## 2024-04-13 DIAGNOSIS — Z30433 Encounter for removal and reinsertion of intrauterine contraceptive device: Secondary | ICD-10-CM | POA: Diagnosis not present

## 2024-04-13 DIAGNOSIS — Z113 Encounter for screening for infections with a predominantly sexual mode of transmission: Secondary | ICD-10-CM | POA: Diagnosis not present

## 2024-06-10 ENCOUNTER — Other Ambulatory Visit (INDEPENDENT_AMBULATORY_CARE_PROVIDER_SITE_OTHER): Payer: Self-pay | Admitting: Primary Care

## 2024-06-10 DIAGNOSIS — E890 Postprocedural hypothyroidism: Secondary | ICD-10-CM

## 2024-06-10 DIAGNOSIS — M62830 Muscle spasm of back: Secondary | ICD-10-CM

## 2024-06-11 NOTE — Telephone Encounter (Signed)
 Requested medications are due for refill today.  yes  Requested medications are on the active medications list.  yes  Last refill. Levothyroxine  03/26/2024 #30 0 rf, Cyclobenzaprine  12/17/2023 #90 1 rf  Future visit scheduled.   No.   Notes to clinic.  Levothyroxine   - abnormal labs. Cyclobenzaprine  is not delegated.    Requested Prescriptions  Pending Prescriptions Disp Refills   levothyroxine  (SYNTHROID ) 112 MCG tablet 30 tablet 0    Sig: Take 1 tablet (112 mcg total) by mouth daily.     Endocrinology:  Hypothyroid Agents Failed - 06/11/2024  1:10 PM      Failed - TSH in normal range and within 360 days    TSH  Date Value Ref Range Status  12/17/2023 30.700 (H) 0.450 - 4.500 uIU/mL Final         Passed - Valid encounter within last 12 months    Recent Outpatient Visits           5 months ago Cervical cancer screening   [place] [facility] Celestia Rosaline SQUIBB, NP   11 months ago Postsurgical hypothyroidism   [place] [facility] Celestia Rosaline SQUIBB, NP   1 year ago Postsurgical hypothyroidism   [place] [facility] Celestia Rosaline SQUIBB, NP   1 year ago Postsurgical hypothyroidism   [place] [facility] Celestia Rosaline SQUIBB, NP   2 years ago Essential hypertension   [place] [facility] Celestia Rosaline SQUIBB, NP               cyclobenzaprine  (FLEXERIL ) 10 MG tablet 90 tablet 1    Sig: Take 1 tablet (10 mg total) by mouth 3 (three) times daily as needed for muscle spasms.     Not Delegated - Analgesics:  Muscle Relaxants Failed - 06/11/2024  1:10 PM      Failed - This refill cannot be delegated      Passed - Valid encounter within last 6 months    Recent Outpatient Visits           5 months ago Cervical cancer screening   [place] [facility] Celestia Rosaline SQUIBB, NP   11 months ago Postsurgical hypothyroidism   [place] [facility]  Celestia Rosaline SQUIBB, NP   1 year ago Postsurgical hypothyroidism   [place] [facility] Celestia Rosaline SQUIBB, NP   1 year ago Postsurgical hypothyroidism   [place] [facility] Celestia Rosaline SQUIBB, NP   2 years ago Essential hypertension    [facility] Celestia Rosaline SQUIBB, NP

## 2024-06-12 ENCOUNTER — Other Ambulatory Visit: Payer: Self-pay

## 2024-06-12 MED ORDER — LEVOTHYROXINE SODIUM 112 MCG PO TABS
112.0000 ug | ORAL_TABLET | Freq: Every day | ORAL | 0 refills | Status: DC
Start: 2024-06-12 — End: 2024-07-29
  Filled 2024-06-12: qty 30, 30d supply, fill #0

## 2024-06-12 MED ORDER — CYCLOBENZAPRINE HCL 10 MG PO TABS
10.0000 mg | ORAL_TABLET | Freq: Three times a day (TID) | ORAL | 0 refills | Status: DC | PRN
  Filled 2024-06-12: qty 30, 10d supply, fill #0

## 2024-07-04 ENCOUNTER — Encounter (INDEPENDENT_AMBULATORY_CARE_PROVIDER_SITE_OTHER): Payer: Self-pay | Admitting: Primary Care

## 2024-07-04 DIAGNOSIS — E890 Postprocedural hypothyroidism: Secondary | ICD-10-CM

## 2024-07-06 NOTE — Telephone Encounter (Signed)
 Will forward to provider

## 2024-07-27 ENCOUNTER — Other Ambulatory Visit (INDEPENDENT_AMBULATORY_CARE_PROVIDER_SITE_OTHER): Payer: Self-pay | Admitting: Primary Care

## 2024-07-27 DIAGNOSIS — M62830 Muscle spasm of back: Secondary | ICD-10-CM

## 2024-07-27 DIAGNOSIS — E890 Postprocedural hypothyroidism: Secondary | ICD-10-CM

## 2024-07-28 ENCOUNTER — Other Ambulatory Visit: Payer: Self-pay

## 2024-07-29 ENCOUNTER — Other Ambulatory Visit (INDEPENDENT_AMBULATORY_CARE_PROVIDER_SITE_OTHER): Payer: Self-pay | Admitting: Primary Care

## 2024-07-29 DIAGNOSIS — E890 Postprocedural hypothyroidism: Secondary | ICD-10-CM

## 2024-07-29 DIAGNOSIS — M62830 Muscle spasm of back: Secondary | ICD-10-CM

## 2024-07-30 ENCOUNTER — Other Ambulatory Visit (INDEPENDENT_AMBULATORY_CARE_PROVIDER_SITE_OTHER): Payer: Self-pay | Admitting: Primary Care

## 2024-07-30 DIAGNOSIS — M62830 Muscle spasm of back: Secondary | ICD-10-CM

## 2024-07-31 ENCOUNTER — Other Ambulatory Visit: Payer: Self-pay

## 2024-07-31 MED ORDER — LEVOTHYROXINE SODIUM 112 MCG PO TABS
112.0000 ug | ORAL_TABLET | Freq: Every day | ORAL | 0 refills | Status: DC
  Filled 2024-07-31: qty 90, 90d supply, fill #0

## 2024-07-31 NOTE — Telephone Encounter (Signed)
 Requested Prescriptions  Pending Prescriptions Disp Refills   levothyroxine  (SYNTHROID ) 112 MCG tablet 90 tablet 0    Sig: Take 1 tablet (112 mcg total) by mouth daily. Needs a office visits for refills     Endocrinology:  Hypothyroid Agents Failed - 07/31/2024 11:19 AM      Failed - TSH in normal range and within 360 days    TSH  Date Value Ref Range Status  12/17/2023 30.700 (H) 0.450 - 4.500 uIU/mL Final         Passed - Valid encounter within last 12 months    Recent Outpatient Visits           7 months ago Cervical cancer screening   [place] [facility] Celestia Rosaline SQUIBB, NP   1 year ago Postsurgical hypothyroidism   [place] [facility] Celestia Rosaline SQUIBB, NP   1 year ago Postsurgical hypothyroidism   [place] [facility] Celestia Rosaline SQUIBB, NP   2 years ago Postsurgical hypothyroidism   [place] [facility] Celestia Rosaline SQUIBB, NP   2 years ago Essential hypertension   [place] [facility] Celestia Rosaline SQUIBB, NP

## 2024-08-03 ENCOUNTER — Other Ambulatory Visit (INDEPENDENT_AMBULATORY_CARE_PROVIDER_SITE_OTHER): Payer: Self-pay | Admitting: Primary Care

## 2024-08-03 DIAGNOSIS — M62830 Muscle spasm of back: Secondary | ICD-10-CM

## 2024-08-05 ENCOUNTER — Other Ambulatory Visit: Payer: Self-pay

## 2024-08-05 MED ORDER — CYCLOBENZAPRINE HCL 10 MG PO TABS
10.0000 mg | ORAL_TABLET | Freq: Three times a day (TID) | ORAL | 0 refills | Status: AC | PRN
  Filled 2024-08-05: qty 30, 10d supply, fill #0

## 2024-08-06 ENCOUNTER — Other Ambulatory Visit: Payer: Self-pay

## 2024-08-07 ENCOUNTER — Other Ambulatory Visit (HOSPITAL_COMMUNITY): Payer: Self-pay

## 2024-08-10 ENCOUNTER — Telehealth (INDEPENDENT_AMBULATORY_CARE_PROVIDER_SITE_OTHER): Payer: Self-pay

## 2024-08-10 ENCOUNTER — Other Ambulatory Visit (INDEPENDENT_AMBULATORY_CARE_PROVIDER_SITE_OTHER): Payer: Self-pay | Admitting: Primary Care

## 2024-08-10 DIAGNOSIS — E890 Postprocedural hypothyroidism: Secondary | ICD-10-CM

## 2024-08-10 NOTE — Telephone Encounter (Unsigned)
 Copied from CRM #[id number]. Topic: Clinical - Medication Refill >> Aug 10, 2024  1:38 PM Emylou G wrote: Medication: levothyroxine  (SYNTHROID ) 112 MCG tablet  Accidentally through it away when cleaning  Has the patient contacted their pharmacy? No (Agent: If no, request that the patient contact the pharmacy for the refill. If patient does not wish to contact the pharmacy document the reason why and proceed with request.) (Agent: If yes, when and what did the pharmacy advise?)  This is the patient's preferred pharmacy:  [facility] [address] [place] [address] Phone: [phone] Fax: [phone]  Is this the correct pharmacy for this prescription? Yes If no, delete pharmacy and type the correct one.   Has the prescription been filled recently? No  Is the patient out of the medication? Yes  Has the patient been seen for an appointment in the last year OR does the patient have an upcoming appointment? Yes  Can we respond through MyChart? Yes  Agent: Please be advised that Rx refills may take up to 3 business days. We ask that you follow-up with your pharmacy.

## 2024-08-11 ENCOUNTER — Other Ambulatory Visit (HOSPITAL_COMMUNITY): Payer: Self-pay

## 2024-08-11 NOTE — Telephone Encounter (Signed)
 Duplicate request. See refill request from 08/10/24.

## 2024-08-13 ENCOUNTER — Other Ambulatory Visit: Payer: Self-pay

## 2024-08-13 MED ORDER — LEVOTHYROXINE SODIUM 112 MCG PO TABS
112.0000 ug | ORAL_TABLET | Freq: Every day | ORAL | 0 refills | Status: AC
  Filled 2024-08-13 – 2024-10-23 (×2): qty 90, 90d supply, fill #0

## 2024-08-13 NOTE — Telephone Encounter (Signed)
 Requested Prescriptions  Pending Prescriptions Disp Refills   levothyroxine  (SYNTHROID ) 112 MCG tablet 90 tablet 0    Sig: Take 1 tablet (112 mcg total) by mouth daily. Needs a office visits for refills     Endocrinology:  Hypothyroid Agents Failed - 08/13/2024 10:10 AM      Failed - TSH in normal range and within 360 days    TSH  Date Value Ref Range Status  12/17/2023 30.700 (H) 0.450 - 4.500 uIU/mL Final         Passed - Valid encounter within last 12 months    Recent Outpatient Visits           8 months ago Cervical cancer screening   [place] [facility] Celestia Rosaline SQUIBB, NP   1 year ago Postsurgical hypothyroidism   [place] [facility] Celestia Rosaline SQUIBB, NP   1 year ago Postsurgical hypothyroidism   [place] [facility] Celestia Rosaline SQUIBB, NP   2 years ago Postsurgical hypothyroidism   [place] [facility] Celestia Rosaline SQUIBB, NP   2 years ago Essential hypertension   [place] [facility] Celestia Rosaline SQUIBB, NP

## 2024-10-13 ENCOUNTER — Other Ambulatory Visit: Payer: Self-pay

## 2024-10-23 ENCOUNTER — Other Ambulatory Visit: Payer: Self-pay

## 2024-10-23 ENCOUNTER — Other Ambulatory Visit (INDEPENDENT_AMBULATORY_CARE_PROVIDER_SITE_OTHER): Payer: Self-pay | Admitting: Primary Care

## 2024-10-23 DIAGNOSIS — M62830 Muscle spasm of back: Secondary | ICD-10-CM
